# Patient Record
Sex: Male | Born: 1935 | Race: White | Hispanic: No | Smoking: Never smoker
Health system: Southern US, Community
[De-identification: ages and names within clinical notes are randomized; demographics above are authoritative.]

## PROBLEM LIST (undated history)

## (undated) DIAGNOSIS — I10 Essential (primary) hypertension: Secondary | ICD-10-CM

## (undated) DIAGNOSIS — E039 Hypothyroidism, unspecified: Secondary | ICD-10-CM

## (undated) DIAGNOSIS — G7000 Myasthenia gravis without (acute) exacerbation: Secondary | ICD-10-CM

## (undated) HISTORY — PX: BACK SURGERY: SHX140

---

## 2009-04-12 ENCOUNTER — Ambulatory Visit: Payer: Self-pay | Admitting: Urology

## 2020-04-19 ENCOUNTER — Emergency Department: Payer: Medicare Other

## 2020-04-19 ENCOUNTER — Encounter: Payer: Self-pay | Admitting: Internal Medicine

## 2020-04-19 ENCOUNTER — Other Ambulatory Visit: Payer: Self-pay

## 2020-04-19 ENCOUNTER — Inpatient Hospital Stay
Admission: EM | Admit: 2020-04-19 | Discharge: 2020-04-22 | DRG: 683 | Disposition: A | Discharge status: Home / Self Care | Payer: Medicare Other | Source: Skilled Nursing Facility | Attending: Internal Medicine | Admitting: Internal Medicine

## 2020-04-19 DIAGNOSIS — Z66 Do not resuscitate: Secondary | ICD-10-CM | POA: Diagnosis present

## 2020-04-19 DIAGNOSIS — M5442 Lumbago with sciatica, left side: Secondary | ICD-10-CM

## 2020-04-19 DIAGNOSIS — M549 Dorsalgia, unspecified: Secondary | ICD-10-CM | POA: Diagnosis present

## 2020-04-19 DIAGNOSIS — W19XXXA Unspecified fall, initial encounter: Secondary | ICD-10-CM | POA: Diagnosis not present

## 2020-04-19 DIAGNOSIS — Z79899 Other long term (current) drug therapy: Secondary | ICD-10-CM

## 2020-04-19 DIAGNOSIS — Z20822 Contact with and (suspected) exposure to covid-19: Secondary | ICD-10-CM | POA: Diagnosis present

## 2020-04-19 DIAGNOSIS — Z79890 Hormone replacement therapy (postmenopausal): Secondary | ICD-10-CM

## 2020-04-19 DIAGNOSIS — E039 Hypothyroidism, unspecified: Secondary | ICD-10-CM | POA: Diagnosis not present

## 2020-04-19 DIAGNOSIS — Z981 Arthrodesis status: Secondary | ICD-10-CM

## 2020-04-19 DIAGNOSIS — I482 Chronic atrial fibrillation, unspecified: Secondary | ICD-10-CM | POA: Diagnosis not present

## 2020-04-19 DIAGNOSIS — I10 Essential (primary) hypertension: Secondary | ICD-10-CM | POA: Diagnosis present

## 2020-04-19 DIAGNOSIS — M25552 Pain in left hip: Secondary | ICD-10-CM | POA: Diagnosis present

## 2020-04-19 DIAGNOSIS — Z96641 Presence of right artificial hip joint: Secondary | ICD-10-CM | POA: Diagnosis present

## 2020-04-19 DIAGNOSIS — M1612 Unilateral primary osteoarthritis, left hip: Secondary | ICD-10-CM | POA: Diagnosis present

## 2020-04-19 DIAGNOSIS — M25562 Pain in left knee: Secondary | ICD-10-CM | POA: Diagnosis present

## 2020-04-19 DIAGNOSIS — Z7901 Long term (current) use of anticoagulants: Secondary | ICD-10-CM

## 2020-04-19 DIAGNOSIS — G7000 Myasthenia gravis without (acute) exacerbation: Secondary | ICD-10-CM | POA: Diagnosis present

## 2020-04-19 DIAGNOSIS — N179 Acute kidney failure, unspecified: Principal | ICD-10-CM | POA: Diagnosis present

## 2020-04-19 DIAGNOSIS — G894 Chronic pain syndrome: Secondary | ICD-10-CM | POA: Diagnosis present

## 2020-04-19 DIAGNOSIS — Z9181 History of falling: Secondary | ICD-10-CM

## 2020-04-19 DIAGNOSIS — Z7952 Long term (current) use of systemic steroids: Secondary | ICD-10-CM

## 2020-04-19 DIAGNOSIS — Z86711 Personal history of pulmonary embolism: Secondary | ICD-10-CM

## 2020-04-19 HISTORY — DX: Essential (primary) hypertension: I10

## 2020-04-19 HISTORY — DX: Hypothyroidism, unspecified: E03.9

## 2020-04-19 LAB — CBC
HCT: 43.3 % (ref 39.0–52.0)
Hemoglobin: 13.5 g/dL (ref 13.0–17.0)
MCH: 29.9 pg (ref 26.0–34.0)
MCHC: 31.2 g/dL (ref 30.0–36.0)
MCV: 96 fL (ref 80.0–100.0)
Platelets: 258 10*3/uL (ref 150–400)
RBC: 4.51 MIL/uL (ref 4.22–5.81)
RDW: 15.8 % — ABNORMAL HIGH (ref 11.5–15.5)
WBC: 11.5 10*3/uL — ABNORMAL HIGH (ref 4.0–10.5)
nRBC: 0 % (ref 0.0–0.2)

## 2020-04-19 LAB — COMPREHENSIVE METABOLIC PANEL
ALT: 14 U/L (ref 0–44)
AST: 19 U/L (ref 15–41)
Albumin: 3.7 g/dL (ref 3.5–5.0)
Alkaline Phosphatase: 52 U/L (ref 38–126)
Anion gap: 9 (ref 5–15)
BUN: 23 mg/dL (ref 8–23)
CO2: 30 mmol/L (ref 22–32)
Calcium: 8.9 mg/dL (ref 8.9–10.3)
Chloride: 100 mmol/L (ref 98–111)
Creatinine, Ser: 1.37 mg/dL — ABNORMAL HIGH (ref 0.61–1.24)
GFR, Estimated: 47 mL/min — ABNORMAL LOW (ref >60)
Glucose, Bld: 95 mg/dL (ref 70–99)
Potassium: 4 mmol/L (ref 3.5–5.1)
Sodium: 139 mmol/L (ref 135–145)
Total Bilirubin: 0.8 mg/dL (ref 0.3–1.2)
Total Protein: 6.2 g/dL — ABNORMAL LOW (ref 6.5–8.1)

## 2020-04-19 LAB — RESP PANEL BY RT-PCR (FLU A&B, COVID) ARPGX2
Influenza A by PCR: NEGATIVE
Influenza B by PCR: NEGATIVE
SARS Coronavirus 2 by RT PCR: NEGATIVE

## 2020-04-19 MED ORDER — ONDANSETRON HCL 4 MG PO TABS: 4.0000 mg | ORAL_TABLET | Freq: Four times a day (QID) | ORAL | Status: DC | PRN

## 2020-04-19 MED ORDER — FENTANYL CITRATE (PF) 100 MCG/2ML IJ SOLN
50.0000 ug | Freq: Once | INTRAMUSCULAR | Status: AC
Start: 2020-04-19 — End: 2020-04-19
  Administered 2020-04-19: 50 ug via INTRAVENOUS
  Filled 2020-04-19: qty 2

## 2020-04-19 MED ORDER — MYCOPHENOLATE MOFETIL 500 MG PO TABS: 500.0000 mg | ORAL_TABLET | Freq: Two times a day (BID) | ORAL | Status: DC

## 2020-04-19 MED ORDER — PREDNISONE 20 MG PO TABS
20.0000 mg | ORAL_TABLET | Freq: Every day | ORAL | Status: DC
  Administered 2020-04-19 – 2020-04-22 (×4): 20 mg via ORAL
  Filled 2020-04-19 (×4): qty 1

## 2020-04-19 MED ORDER — MYCOPHENOLATE MOFETIL 250 MG PO CAPS
500.0000 mg | ORAL_CAPSULE | Freq: Every day | ORAL | Status: DC
  Administered 2020-04-19 – 2020-04-21 (×3): 500 mg via ORAL
  Filled 2020-04-19 (×5): qty 2

## 2020-04-19 MED ORDER — CALCIUM CARBONATE-VITAMIN D 500-200 MG-UNIT PO TABS
1.0000 | ORAL_TABLET | Freq: Every day | ORAL | Status: DC
  Administered 2020-04-20 – 2020-04-22 (×3): 1 via ORAL
  Filled 2020-04-19 (×3): qty 1

## 2020-04-19 MED ORDER — APIXABAN 5 MG PO TABS
5.0000 mg | ORAL_TABLET | Freq: Two times a day (BID) | ORAL | Status: DC
  Administered 2020-04-19 – 2020-04-22 (×7): 5 mg via ORAL
  Filled 2020-04-19 (×7): qty 1

## 2020-04-19 MED ORDER — MYCOPHENOLATE MOFETIL 250 MG PO CAPS
1000.0000 mg | ORAL_CAPSULE | Freq: Every day | ORAL | Status: DC
  Administered 2020-04-19 – 2020-04-22 (×4): 1000 mg via ORAL
  Filled 2020-04-19 (×4): qty 4

## 2020-04-19 MED ORDER — ACETAMINOPHEN 500 MG PO TABS
500.0000 mg | ORAL_TABLET | Freq: Three times a day (TID) | ORAL | Status: DC | PRN
  Administered 2020-04-20 – 2020-04-21 (×2): 500 mg via ORAL
  Filled 2020-04-19 (×2): qty 1

## 2020-04-19 MED ORDER — PANTOPRAZOLE SODIUM 40 MG PO TBEC
40.0000 mg | DELAYED_RELEASE_TABLET | Freq: Every day | ORAL | Status: DC
  Administered 2020-04-19 – 2020-04-22 (×4): 40 mg via ORAL
  Filled 2020-04-19 (×4): qty 1

## 2020-04-19 MED ORDER — TIMOLOL MALEATE 0.25 % OP SOLN
1.0000 [drp] | Freq: Two times a day (BID) | OPHTHALMIC | Status: DC
  Administered 2020-04-19 – 2020-04-22 (×6): 1 [drp] via OPHTHALMIC
  Filled 2020-04-19: qty 5

## 2020-04-19 MED ORDER — ALBUTEROL SULFATE HFA 108 (90 BASE) MCG/ACT IN AERS
1.0000 | INHALATION_SPRAY | Freq: Four times a day (QID) | RESPIRATORY_TRACT | Status: DC | PRN
  Filled 2020-04-19: qty 6.7

## 2020-04-19 MED ORDER — FENTANYL CITRATE (PF) 100 MCG/2ML IJ SOLN
50.0000 ug | Freq: Once | INTRAMUSCULAR | Status: AC
  Administered 2020-04-19: 50 ug via INTRAVENOUS
  Filled 2020-04-19: qty 2

## 2020-04-19 MED ORDER — TRAMADOL HCL 50 MG PO TABS
50.0000 mg | ORAL_TABLET | Freq: Four times a day (QID) | ORAL | Status: DC | PRN
  Administered 2020-04-19 – 2020-04-22 (×7): 50 mg via ORAL
  Filled 2020-04-19 (×8): qty 1

## 2020-04-19 MED ORDER — LIDOCAINE 5 % EX PTCH
1.0000 | MEDICATED_PATCH | CUTANEOUS | Status: DC
  Administered 2020-04-19 – 2020-04-21 (×3): 1 via TRANSDERMAL
  Filled 2020-04-19 (×4): qty 1

## 2020-04-19 MED ORDER — FUROSEMIDE 20 MG PO TABS
20.0000 mg | ORAL_TABLET | Freq: Every day | ORAL | Status: DC
  Administered 2020-04-19 – 2020-04-20 (×2): 20 mg via ORAL
  Filled 2020-04-19 (×2): qty 1

## 2020-04-19 MED ORDER — PYRIDOSTIGMINE BROMIDE 60 MG PO TABS
60.0000 mg | ORAL_TABLET | Freq: Three times a day (TID) | ORAL | Status: DC
  Administered 2020-04-19 – 2020-04-22 (×9): 60 mg via ORAL
  Filled 2020-04-19 (×12): qty 1

## 2020-04-19 MED ORDER — BRIMONIDINE TARTRATE 0.2 % OP SOLN
1.0000 [drp] | Freq: Two times a day (BID) | OPHTHALMIC | Status: DC
  Administered 2020-04-19 – 2020-04-22 (×6): 1 [drp] via OPHTHALMIC
  Filled 2020-04-19: qty 5

## 2020-04-19 MED ORDER — ONDANSETRON HCL 4 MG/2ML IJ SOLN
4.0000 mg | Freq: Once | INTRAMUSCULAR | Status: AC
Start: 2020-04-19 — End: 2020-04-19
  Administered 2020-04-19: 4 mg via INTRAVENOUS
  Filled 2020-04-19: qty 2

## 2020-04-19 MED ORDER — LEVOTHYROXINE SODIUM 88 MCG PO TABS
88.0000 ug | ORAL_TABLET | Freq: Every day | ORAL | Status: DC
  Administered 2020-04-20 – 2020-04-22 (×3): 88 ug via ORAL
  Filled 2020-04-19 (×3): qty 1

## 2020-04-19 MED ORDER — ONDANSETRON HCL 4 MG/2ML IJ SOLN: 4.0000 mg | Freq: Four times a day (QID) | INTRAMUSCULAR | Status: DC | PRN

## 2020-04-19 NOTE — ED Notes (Signed)
Pt still c.o pain to lower back, Dr Lenard Lance notified and prn pain med administered.  Family at bedside

## 2020-04-19 NOTE — ED Provider Notes (Signed)
[facility] Emergency Department Provider Note  Time seen: 7:46 AM  I have reviewed the triage vital signs and the nursing notes.   HISTORY  Chief Complaint Back Pain   HPI Cadarius Nevares is a 87 y.o. male with a past medical history of back pain status post lumbar fusion many years ago, atrial fibrillation on Eliquis, presents to the emergency department with back pain after a fall 3 days ago.  According to the patient and daughter 3 days ago patient fell and hit his left hip/pelvis on the counter.  He has been having pain ever since but per his nursing home had considerably more pain last night and today so they called the daughter and sent him to the emergency department for evaluation.  Here the daughter states the patient has back pain at times but nothing like this.  Patient describes pain going down the back of the left leg at times.  Normal sensation in his foot.  No incontinence.   No past medical history on file.  There are no problems to display for this patient.    Prior to Admission medications   Not on File    Allergies  Allergen Reactions  . Sulfa Antibiotics     No family history on file.  Social History    Review of Systems Constitutional: Negative for fever. Cardiovascular: Negative for chest pain. Respiratory: Negative for shortness of breath. Gastrointestinal: Negative for abdominal pain, vomiting Musculoskeletal: Left lower back/hip pain.  Moderate to severe, worse with movement. Neurological: Negative for headache All other ROS negative  ____________________________________________   PHYSICAL EXAM:  VITAL SIGNS: ED Triage Vitals  Enc Vitals Group     BP 04/19/20 0656 (!) 155/104     Pulse Rate 04/19/20 0656 83     Resp 04/19/20 0656 16     Temp 04/19/20 0656 98.2 F (36.8 C)     Temp Source 04/19/20 0656 Oral     SpO2 04/19/20 0656 100 %     Weight 04/19/20 0657 180 lb (81.6 kg)     Height 04/19/20 0657 5\' 7"   (1.702 m)     Head Circumference --      Peak Flow --      Pain Score 04/19/20 0657 10     Pain Loc --      Pain Edu? --      Excl. in GC? --    Constitutional: Alert and oriented. Well appearing and in no distress. Eyes: Normal exam ENT      Head: Normocephalic and atraumatic.      Mouth/Throat: Mucous membranes are moist. Cardiovascular: Normal rate, regular rhythm.  Respiratory: Normal respiratory effort without tachypnea nor retractions. Breath sounds are clear Gastrointestinal: Soft and nontender. No distention.  Musculoskeletal: Left hip tenderness palpation, left SI joint tenderness to palpation.  Pain with range of motion of left lower extremity. Neurologic:  Normal speech and language. No gross focal neurologic deficits.  Normal sensation to left lower extremity. Skin:  Skin is warm, dry Psychiatric: Mood and affect are normal.   ____________________________________________    EKG  EKG viewed and interpreted by myself shows atrial fibrillation at 81 bpm with a narrow QRS, normal axis, normal intervals, no concerning ST changes.  ____________________________________________    RADIOLOGY  X-rays are negative. CT scans are negative for acute abnormality besides subcutaneous edema lateral to left greater trochanter.  ____________________________________________   INITIAL IMPRESSION / ASSESSMENT AND PLAN / ED COURSE  Pertinent labs & imaging  results that were available during my care of the patient were reviewed by me and considered in my medical decision making (see chart for details).   Patient presents to the emergency department after a fall 3 days ago lower back pain.  Patient does have tenderness over the left hip left SI joint area.  Could be skeletal versus muscular.  Also describes pain occasionally radiating down the left leg could be experiencing sciatica.  Patient has good sensation throughout the left leg, no incontinence, no concern for cauda equina at  this time.  We will check x-rays, treat pain and nausea while awaiting results.  Patient and daughter agreeable plan of care.  Imaging is negative for acute bony abnormality.  However patient continues to have significant pain worse with movement despite IV medication.  Given the patient's continued pain suspect likely muscular pain/spasm versus sciatica.  Patient unable to ambulate or get up safely due to pain.  We will admit to the hospitalist service for pain control.  Patient agreeable plan of care.  Jolon Degante was evaluated in Emergency Department on 04/19/2020 for the symptoms described in the history of present illness. He was evaluated in the context of the global COVID-19 pandemic, which necessitated consideration that the patient might be at risk for infection with the SARS-CoV-2 virus that causes COVID-19. Institutional protocols and algorithms that pertain to the evaluation of patients at risk for COVID-19 are in a state of rapid change based on information released by regulatory bodies including the CDC and federal and state organizations. These policies and algorithms were followed during the patient's care in the ED.  ____________________________________________   FINAL CLINICAL IMPRESSION(S) / ED DIAGNOSES  acute back pain   Minna Antis, MD 04/19/20 1127

## 2020-04-19 NOTE — ED Notes (Signed)
Called lab to add on CBC and CMP to previously sent blood work. Per lab, will add on at this time

## 2020-04-19 NOTE — ED Triage Notes (Signed)
Pt BIB EMS, sustained mechanical fall 2 days ago while reaching for walker from bed. Now complaining of worsening pain to lower back radiating to left leg. Pt denies hitting head or LOC, is on Elaquis.

## 2020-04-19 NOTE — H&P (Signed)
History and Physical    Gregory Small IWL:[id number] DOB: 01/11/1920 DOA: 04/19/2020  PCP: Almet[facility]   Patient coming from: Assisted living facility  I have personally briefly reviewed patient's old medical records in [facility]  Chief Complaint: Pain in the left hip  HPI: Gregory Small is a 87 y.o. male with medical history significant for prior lumbar fusion, A. fib on anticoagulation, hypothyroidism and myasthenia gravis who presents to the ER via EMS for evaluation of pain involving his left hip he has had for about 3 days.  He rates his pain an 8 x 10 in intensity at its worst with radiation down his left leg.  Patient sustained a mechanical fall while reaching for his walker from his bed landing on his left side.  He denies hitting his head or loss of consciousness. The staff at the assisted living facility had tried Tylenol for pain control as well as ice without any significant improvement and so patient was brought to the ER today.  He received 2 doses of fentanyl with transient improvement in his pain. He denies having any chest pain, no shortness of breath, no dizziness, no lightheadedness, no headache, no nausea, no vomiting, no diarrhea, no abdominal pain, no fever, no chills, no cough, no sore throat , no urinary frequency, no nocturia, no dysuria. Labs show sodium 139, potassium 4.0, chloride 100, bicarb 30, glucose 95, BUN 23, creatinine 1.37, calcium 8.9, alkaline phosphatase 52, albumin 3.7, AST 19, ALT 14, total protein 6.2, white count 11.5, hemoglobin 13.5, hematocrit 43.3, MCV 96, RDW 15.8, platelet count 258 Respiratory viral panel is pending Left hip x-ray shows mild degenerative joint disease of the left hip. No acute abnormality is noted. Lumbar spine x-ray shows  post surgical posterior fusion of L4-5 and L5-S1. Multilevel degenerative disc disease is noted. No acute abnormality seen in the lumbar spine. Aortic Atherosclerosis CT scan of  the pelvis without contrast shows no acute osseous abnormality of the pelvis. Mild left hip osteoarthritis. Focal subcutaneous edema lateral to the left greater trochanter without focal hematoma. Postsurgical changes status post right total hip arthroplasty and lumbosacral fusion. No evidence of hardware complication. Colonic diverticulosis without evidence of acute diverticulitis. Aortic atherosclerosis  Twelve-lead EKG reviewed by me shows atrial fibrillation.    ED Course: Patient is a 87 year old Caucasian male who presents to the ER for evaluation of left hip pain following a mechanical fall.  He received 2 doses of fentanyl in the ER with transient improvement in his pain and will be referred to observation for further evaluation.   Review of Systems: As per HPI otherwise all other systems reviewed and negative.    Past Medical History:  Diagnosis Date  . Hypertension   . Hypothyroidism       reports that he has never smoked. He does not have any smokeless tobacco history on file. He reports previous alcohol use. He reports that he does not use drugs.  Allergies  Allergen Reactions  . Simvastatin Other (See Comments)    Myalgias   . Sulfa Antibiotics     Family History  Family history unknown: Yes      Prior to Admission medications   Medication Sig Start Date End Date Taking? Authorizing Provider  acetaminophen (TYLENOL) 500 MG tablet Take 500-1,000 mg by mouth every 8 (eight) hours as needed for pain. 12/24/19  Yes [provider]  brimonidine (ALPHAGAN) 0.2 % ophthalmic solution Place 1 drop into both eyes 2 (two) times daily.  04/12/20  Yes [provider]  Calcium Carbonate-Vit D-Min (CALTRATE 600+D PLUS MINERALS) 600-800 MG-UNIT TABS Take 1 tablet by mouth daily.   Yes [provider]  ELIQUIS 5 MG TABS tablet Take 5 mg by mouth 2 (two) times daily. 04/14/20  Yes [provider]  furosemide (LASIX) 20 MG tablet Take 20 mg by mouth  daily. 03/30/20  Yes [provider]  levothyroxine (SYNTHROID) 88 MCG tablet Take 88 mcg by mouth daily before breakfast. 11/12/19  Yes [provider]  mycophenolate (CELLCEPT) 500 MG tablet Take 500-1,000 mg by mouth 2 (two) times daily. 04/14/20  Yes [provider]  pantoprazole (PROTONIX) 40 MG tablet Take 40 mg by mouth daily. 04/14/20  Yes [provider]  predniSONE (DELTASONE) 20 MG tablet Take 20 mg by mouth daily. 04/14/20  Yes [provider]  pyridostigmine (MESTINON) 60 MG tablet Take 60 mg by mouth 3 (three) times daily. 04/14/20  Yes [provider]  timolol (TIMOPTIC) 0.25 % ophthalmic solution Apply 1 drop to eye 2 (two) times daily. 10/22/19  Yes [provider]  albuterol (VENTOLIN HFA) 108 (90 Base) MCG/ACT inhaler Inhale 1-2 puffs into the lungs every 6 (six) hours as needed for wheezing or shortness of breath. 04/15/20   [provider]    Physical Exam: Vitals:   04/19/20 0930 04/19/20 1015 04/19/20 1100 04/19/20 1107  BP: (!) 151/80   (!) 150/80  Pulse:  79 86 85  Resp: 18 18 17 17   Temp:      TempSrc:      SpO2:  100% 100% 100%  Weight:      Height:         Vitals:   04/19/20 0930 04/19/20 1015 04/19/20 1100 04/19/20 1107  BP: (!) 151/80   (!) 150/80  Pulse:  79 86 85  Resp: 18 18 17 17   Temp:      TempSrc:      SpO2:  100% 100% 100%  Weight:      Height:          Constitutional: Alert and oriented x 3.  Appears uncomfortable HEENT:      Head: Normocephalic and atraumatic.         Eyes: PERLA, EOMI, Conjunctivae are normal. Sclera is non-icteric.       Mouth/Throat: Mucous membranes are moist.       Neck: Supple with no signs of meningismus. Cardiovascular: Regular rate and rhythm. No murmurs, gallops, or rubs. 2+ symmetrical distal pulses are present . No JVD. No LE edema Respiratory: Respiratory effort normal .Lungs sounds clear bilaterally. No wheezes, crackles, or rhonchi.   Gastrointestinal: Soft, non tender, and non distended with positive bowel sounds.  Genitourinary: No CVA tenderness. Musculoskeletal: tender over the left hip, decrease ROM involving the left hip  Neurologic:  Face is symmetric. Moving all extremities. No gross focal neurologic deficits  Skin: Skin is warm, dry.  No rash or ulcers Psychiatric: Mood and affect are normal   Labs on Admission: I have personally reviewed following labs and imaging studies  CBC: Recent Labs  Lab 04/19/20 0701  WBC 11.5*  HGB 13.5  HCT 43.3  MCV 96.0  PLT 258   Basic Metabolic Panel: Recent Labs  Lab 04/19/20 0701  NA 139  K 4.0  CL 100  CO2 30  GLUCOSE 95  BUN 23  CREATININE 1.37*  CALCIUM 8.9   GFR: Estimated Creatinine Clearance: 30.8 mL/min (A) (by C-G formula based on  SCr of 1.37 mg/dL (H)). Liver Function Tests: Recent Labs  Lab 04/19/20 0701  AST 19  ALT 14  ALKPHOS 52  BILITOT 0.8  PROT 6.2*  ALBUMIN 3.7   No results for input(s): LIPASE, AMYLASE in the last 168 hours. No results for input(s): AMMONIA in the last 168 hours. Coagulation Profile: No results for input(s): INR, PROTIME in the last 168 hours. Cardiac Enzymes: No results for input(s): CKTOTAL, CKMB, CKMBINDEX, TROPONINI in the last 168 hours. BNP (last 3 results) No results for input(s): PROBNP in the last 8760 hours. HbA1C: No results for input(s): HGBA1C in the last 72 hours. CBG: No results for input(s): GLUCAP in the last 168 hours. Lipid Profile: No results for input(s): CHOL, HDL, LDLCALC, TRIG, CHOLHDL, LDLDIRECT in the last 72 hours. Thyroid Function Tests: No results for input(s): TSH, T4TOTAL, FREET4, T3FREE, THYROIDAB in the last 72 hours. Anemia Panel: No results for input(s): VITAMINB12, FOLATE, FERRITIN, TIBC, IRON, RETICCTPCT in the last 72 hours. Urine analysis: No results found for: COLORURINE, APPEARANCEUR, LABSPEC, PHURINE, GLUCOSEU, HGBUR, BILIRUBINUR, KETONESUR, PROTEINUR,  UROBILINOGEN, NITRITE, LEUKOCYTESUR  Radiological Exams on Admission: DG Lumbar Spine Complete  Result Date: 04/19/2020 CLINICAL DATA:  Low back pain after fall 2 days ago. EXAM: LUMBAR SPINE - COMPLETE 4+ VIEW COMPARISON:  None. FINDINGS: Status post surgical posterior fusion of L4-5 and L5-S1 with bilateral intrapedicular screw placement and interbody fusion of L4-5. Minimal grade 1 retrolisthesis of L2-3 and L3-4 is noted secondary to moderate degenerative disc disease at these levels. Moderate degenerative disc disease is also noted at L1-2. No fracture is noted. IMPRESSION: Status post surgical posterior fusion of L4-5 and L5-S1. Multilevel degenerative disc disease is noted. No acute abnormality seen in the lumbar spine. Aortic Atherosclerosis (ICD10-I70.0). Electronically Signed   By: Lupita Raider M.D.   On: 04/19/2020 09:05   CT PELVIS WO CONTRAST  Result Date: 04/19/2020 CLINICAL DATA:  Fall with pelvic pain and left hip pain EXAM: CT PELVIS WITHOUT CONTRAST TECHNIQUE: Multidetector CT imaging of the pelvis was performed following the standard protocol without intravenous contrast. COMPARISON:  X-ray 04/19/2020 FINDINGS: Urinary Tract: Urinary bladder is decompressed, limiting its evaluation. No distal ureteral dilation. Bowel: Scattered colonic diverticulosis. Visualized pelvic bowel loops otherwise within normal limits. No inflammatory changes evident. Vascular/Lymphatic: Extensive aortoiliac calcified atherosclerosis. No intrapelvic or inguinal lymphadenopathy. Reproductive:  No mass or other significant abnormality Other:  No free fluid within the pelvis.  No hernia visualized. Musculoskeletal: Postsurgical changes status post right total hip arthroplasty and L4-S1 posterior and interbody fusion and decompression. No evidence of hardware complication. Evidence of prior bone graft harvest site at the posterior left ilium. Bony pelvis is otherwise intact without evidence of fracture or  diastasis. Mild arthropathy of the SI joints and pubic symphysis. Mild left hip osteoarthritis. No left hip joint effusion is seen. Focal subcutaneous edema lateral to the left greater trochanter without focal hematoma. Mild enthesopathic changes at the bilateral gluteus medius tendon insertions and bilateral hamstring tendon origins. IMPRESSION: 1. No acute osseous abnormality of the pelvis. 2. Mild left hip osteoarthritis. 3. Focal subcutaneous edema lateral to the left greater trochanter without focal hematoma. 4. Postsurgical changes status post right total hip arthroplasty and lumbosacral fusion. No evidence of hardware complication. 5. Colonic diverticulosis without evidence of acute diverticulitis. 6. Aortic atherosclerosis (ICD10-I70.0). Electronically Signed   By: Duanne Guess D.O.   On: 04/19/2020 10:18   DG HIP UNILAT WITH PELVIS 2-3 VIEWS LEFT  Result Date: 04/19/2020  CLINICAL DATA:  Left hip pain after fall. EXAM: DG HIP (WITH OR WITHOUT PELVIS) 2-3V LEFT COMPARISON:  None. FINDINGS: There is no evidence of hip fracture or dislocation. Mild osteophyte formation of the left hip is noted. IMPRESSION: Mild degenerative joint disease of the left hip. No acute abnormality is noted. Electronically Signed   By: Lupita RaiderJames  Green Jr M.D.   On: 04/19/2020 09:04     Assessment/Plan Principal Problem:   Left hip pain Active Problems:   Myasthenia gravis (HCC)   Atrial fibrillation, chronic (HCC)   Hypothyroidism     Left hip pain Most likely contusion from a mechanical fall No evidence of fracture Pain control with Lidoderm patch and Tylenol as needed We will request PT evaluation    Chronic atrial fibrillation Rate controlled Continue Eliquis as primary prophylaxis for an acute stroke    Hypothyroidism Continue Synthroid   Myasthenia gravis Continue CellCept, prednisone and pyridostigmine  DVT prophylaxis: Apixaban Code Status: DO NOT RESUSCITATE Family Communication:  Greater than 50% of time was spent discussing patient's condition and plan of care with his daughter Hyacinth Meekerllison [place]ch[place] at the bedside.  All questions and concerns have been addressed.  She verbalizes understanding and agrees with the plan.  CODE STATUS was discussed and he is a DNR Disposition Plan: Back to previous home environment Consults called: Physical therapy Status: Observation    Rafferty Postlewait MD [facility]     04/19/2020, 12:07 PM

## 2020-04-19 NOTE — ED Notes (Signed)
Pt provided warm blanket for comfort.

## 2020-04-19 NOTE — ED Notes (Signed)
Light green and lavender blood tubes sent to lab for specimen hold.

## 2020-04-20 ENCOUNTER — Observation Stay: Payer: Medicare Other

## 2020-04-20 DIAGNOSIS — G894 Chronic pain syndrome: Secondary | ICD-10-CM | POA: Diagnosis present

## 2020-04-20 DIAGNOSIS — M5442 Lumbago with sciatica, left side: Secondary | ICD-10-CM

## 2020-04-20 DIAGNOSIS — M25552 Pain in left hip: Secondary | ICD-10-CM | POA: Diagnosis not present

## 2020-04-20 DIAGNOSIS — G7000 Myasthenia gravis without (acute) exacerbation: Secondary | ICD-10-CM

## 2020-04-20 DIAGNOSIS — M25562 Pain in left knee: Secondary | ICD-10-CM | POA: Diagnosis present

## 2020-04-20 DIAGNOSIS — E038 Other specified hypothyroidism: Secondary | ICD-10-CM

## 2020-04-20 DIAGNOSIS — I482 Chronic atrial fibrillation, unspecified: Secondary | ICD-10-CM | POA: Diagnosis not present

## 2020-04-20 LAB — CBC
HCT: 40.1 % (ref 39.0–52.0)
Hemoglobin: 12.7 g/dL — ABNORMAL LOW (ref 13.0–17.0)
MCH: 30.2 pg (ref 26.0–34.0)
MCHC: 31.7 g/dL (ref 30.0–36.0)
MCV: 95.5 fL (ref 80.0–100.0)
Platelets: 216 10*3/uL (ref 150–400)
RBC: 4.2 MIL/uL — ABNORMAL LOW (ref 4.22–5.81)
RDW: 15.8 % — ABNORMAL HIGH (ref 11.5–15.5)
WBC: 10.8 10*3/uL — ABNORMAL HIGH (ref 4.0–10.5)
nRBC: 0 % (ref 0.0–0.2)

## 2020-04-20 LAB — BASIC METABOLIC PANEL
Anion gap: 7 (ref 5–15)
BUN: 24 mg/dL — ABNORMAL HIGH (ref 8–23)
CO2: 31 mmol/L (ref 22–32)
Calcium: 8.8 mg/dL — ABNORMAL LOW (ref 8.9–10.3)
Chloride: 99 mmol/L (ref 98–111)
Creatinine, Ser: 1.36 mg/dL — ABNORMAL HIGH (ref 0.61–1.24)
GFR, Estimated: 47 mL/min — ABNORMAL LOW (ref >60)
Glucose, Bld: 111 mg/dL — ABNORMAL HIGH (ref 70–99)
Potassium: 4.4 mmol/L (ref 3.5–5.1)
Sodium: 137 mmol/L (ref 135–145)

## 2020-04-20 MED ORDER — SENNA 8.6 MG PO TABS
1.0000 | ORAL_TABLET | Freq: Every day | ORAL | Status: DC
  Administered 2020-04-20 – 2020-04-21 (×2): 8.6 mg via ORAL
  Filled 2020-04-20 (×2): qty 1

## 2020-04-20 MED ORDER — OXYCODONE HCL ER 10 MG PO T12A
10.0000 mg | EXTENDED_RELEASE_TABLET | Freq: Two times a day (BID) | ORAL | Status: DC
  Administered 2020-04-20 – 2020-04-22 (×5): 10 mg via ORAL
  Filled 2020-04-20 (×5): qty 1

## 2020-04-20 MED ORDER — OXYCODONE HCL 5 MG PO TABS
5.0000 mg | ORAL_TABLET | ORAL | Status: DC | PRN
  Administered 2020-04-21: 5 mg via ORAL
  Filled 2020-04-20: qty 1

## 2020-04-20 MED ORDER — GABAPENTIN 100 MG PO CAPS
100.0000 mg | ORAL_CAPSULE | Freq: Every day | ORAL | Status: DC
Start: 2020-04-20 — End: 2020-04-22
  Administered 2020-04-20 – 2020-04-21 (×2): 100 mg via ORAL
  Filled 2020-04-20 (×2): qty 1

## 2020-04-20 NOTE — TOC Initial Note (Signed)
Transition of Care [facility]) - Initial/Assessment Note    Patient Details  Name: Gregory Small MRN: [id number] Date of Birth: [birth year]  [facility] CM/SW Contact:    Candie Chroman, LCSW Phone Number: 04/20/2020, 3:18 PM  Clinical Narrative:    CSW met with patient. Daughter at bedside. CSW introduced role and explained that PT recommendations would be discussed. Patient listened to conversation but did not participate. Daughter confirmed patient lives at [facility]. She is agreeable to home health PT. She asked CSW to check and see what the ALF's preferred agency is. CSW spoke with patient's med tech. First preference is [facility] but CSW called and they are not in network with his insurance. Will talk with daughter about other options. Patient uses a rollator at the facility. Daughter had concerns about patient not being switched to inpatient status. Explained that he has to meet certain criteria to be switched to inpatient. Daughter requesting more detailed explanation. UR asked CSW or MD to provide explanation. Added MD to secure chat. No further concerns. CSW encouraged patient to contact CSW as needed. CSW will continue to follow patient for support and facilitate return to ALF once medically stable.              Expected Discharge Plan: [place] Barriers to Discharge: Continued Medical Work up   Patient Goals and CMS Choice        Expected Discharge Plan and Services Expected Discharge Plan: [place] Choice: [place] arrangements for the past 2 months: [place]                                      Prior Living Arrangements/Services Living arrangements for the past 2 months: [place] Lives with:: Facility Resident Patient language and need for interpreter reviewed:: Yes Do you feel safe going back to the place where you live?: Yes      Need for  Family Participation in Patient Care: Yes (Comment) Care giver support system in place?: Yes (comment) Current home services: DME Criminal Activity/Legal Involvement Pertinent to Current Situation/Hospitalization: No - Comment as needed  Activities of Daily Living Home Assistive Devices/Equipment: None ADL Screening (condition at time of admission) Patient's cognitive ability adequate to safely complete daily activities?: Yes Is the patient deaf or have difficulty hearing?: Yes Does the patient have difficulty seeing, even when wearing glasses/contacts?: No Does the patient have difficulty concentrating, remembering, or making decisions?: No Patient able to express need for assistance with ADLs?: Yes Does the patient have difficulty dressing or bathing?: Yes Independently performs ADLs?: Yes (appropriate for developmental age) Does the patient have difficulty walking or climbing stairs?: No Weakness of Legs: None Weakness of Arms/Hands: None  Permission Sought/Granted Permission sought to share information with : Facility Contact Representative,Family Supports    Share Information with NAME: Gregory Small  Permission granted to share info w AGENCY: [place] granted to share info w Relationship: Daughter  Permission granted to share info w Contact Information: [phone]  Emotional Assessment Appearance:: Appears stated age Attitude/Demeanor/Rapport: Unable to Assess Affect (typically observed): Unable to Assess Orientation: : Oriented to Self,Oriented to Place,Oriented to  Time,Oriented to Situation Alcohol / Substance Use: Not Applicable Psych Involvement: No (comment)  Admission diagnosis:  Fall [W19.XXXA] Left hip  pain [M25.552] Acute left-sided low back pain with left-sided sciatica [M54.42] Patient Active Problem List   Diagnosis Date Noted  . Left hip pain 04/19/2020  . Myasthenia gravis ([place]) 04/19/2020  .  Atrial fibrillation, chronic ([place]) 04/19/2020  . Hypothyroidism 04/19/2020  . Fall 04/19/2020   PCP:  [facility] Pharmacy:  No Pharmacies Listed    Social Determinants of Health (SDOH) Interventions    Readmission Risk Interventions No flowsheet data found.

## 2020-04-20 NOTE — Plan of Care (Signed)
  Problem: Education: Goal: Knowledge of General Education information will improve Description: Including pain rating scale, medication(s)/side effects and non-pharmacologic comfort measures Outcome: Progressing   Problem: Health Behavior/Discharge Planning: Goal: Ability to manage health-related needs will improve Outcome: Progressing   Problem: Activity: Goal: Risk for activity intolerance will decrease Outcome: Progressing   Problem: Coping: Goal: Level of anxiety will decrease Outcome: Progressing   Problem: Elimination: Goal: Will not experience complications related to bowel motility Outcome: Progressing Goal: Will not experience complications related to urinary retention Outcome: Progressing   Problem: Safety: Goal: Ability to remain free from injury will improve Outcome: Progressing   Problem: Skin Integrity: Goal: Risk for impaired skin integrity will decrease Outcome: Progressing   

## 2020-04-20 NOTE — Evaluation (Signed)
Physical Therapy Evaluation Patient Details Name: Gregory Small MRN: [id number] DOB: September 29, 1919 Today's Date: 04/20/2020   History of Present Illness  Per MD note: Gregory Small is a 87 y.o. male with medical history significant for prior lumbar fusion, A. fib on anticoagulation, hypothyroidism and myasthenia gravis who presents to the ER via EMS for evaluation of pain involving his left hip he has had for about 3 days.  He rates his pain an 8 x 10 in intensity at its worst with radiation down his left leg.  Patient sustained a mechanical fall while reaching for his walker from his bed landing on his left side.  He denies hitting his head or loss of consciousness.  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 No acute abnormality is noted.     Clinical Impression  Pt recevied sitting EOB and agreeable to therapy.  Pt notes that he is experiencing 8/10 pain on numerical pain scale in the L LE around the hip where he fell.  Pt was give pain medication approximately 15 minutes prior to session.  Daughter was present in room during therapy.  Pt was able to perform bed mobility without much difficulty.  Pt did note some dizziness upon standing and daughter endorses pt to have had previous hx of vertigo that is brought about with caffeine, medication, and changes in position.  Pt attempted ambulation once sx's were resolved, however he noted too much pain with performing.  Pt transferred back to EOB where he performed seated exercises.  Pt as difficulty in the L knee due to needing a knee replacement but daughter states he is too old to have procedure.  Pt demonstrates good strength with MMT.  Pt performed STS x3, and on third bout, patient attempted to ambulate.  Pt requires supervision for safety, and VC for maintaining the FWW close.  Pt able to ambulate around the nursing staion and back to room where he was left sitting EOB with daughter in room.  All needs were met, call  bell within reach, and bed alarm turned on.  Nursing notified of pt mobility.  Pt will benefit from skilled PT intervention to increase independence and safety with basic mobility in preparation for discharge to the venue listed below.  Pt and daughter report that he will be able to have physical therapy at his assisted living to assist with returning to PLOF.     Follow Up Recommendations Home health PT    Equipment Recommendations  None recommended by PT    Recommendations for Other Services       Precautions / Restrictions Precautions Precautions: None Restrictions Weight Bearing Restrictions: No      Mobility  Bed Mobility               General bed mobility comments: pt was sitting EOB when arriving to room.    Transfers Overall transfer level: Needs assistance Equipment used: Rolling walker (2 wheeled) Transfers: Sit to/from Stand Sit to Stand: Min guard         General transfer comment: pt need extra time to perform, however does well with CGA for safety.  Ambulation/Gait Ambulation/Gait assistance: Supervision Gait Distance (Feet): 200 Feet Assistive device: Rolling walker (2 wheeled) Gait Pattern/deviations: WFL(Within Functional Limits) Gait velocity: WNL   General Gait Details: Pt ambulates well with FWW, however at some can be too fast and allow the FWW to get too far forward.  Pt able to correct with VC.  Stairs  Wheelchair Mobility    Modified Rankin (Stroke Patients Only)       Balance Overall balance assessment: Mild deficits observed, not formally tested                                           Pertinent Vitals/Pain Pain Assessment: 0-10 Pain Score: 8  Pain Location: L Hip Pain Intervention(s): Limited activity within patient's tolerance;Monitored during session;Premedicated before session;Repositioned    Home Living Family/patient expects to be discharged to:: Assisted living                Home Equipment: Walker - 4 wheels      Prior Function Level of Independence: Independent with assistive device(s)               Hand Dominance        Extremity/Trunk Assessment   Upper Extremity Assessment Upper Extremity Assessment: Generalized weakness    Lower Extremity Assessment Lower Extremity Assessment: Generalized weakness       Communication      Cognition Arousal/Alertness: Awake/alert;Suspect due to medications Behavior During Therapy: [facility] for tasks assessed/performed Overall Cognitive Status: Within Functional Limits for tasks assessed                                 General Comments: Daughter reports she believes he is starting to get a little "loopy" from the pain medication.      General Comments      Exercises General Exercises - Lower Extremity Long Arc Quad: AROM;Strengthening;Both;20 reps;Seated Hip Flexion/Marching: AROM;Strengthening;Both;20 reps;Seated Other Exercises Other Exercises: Pt and daughter educated on roles of Physical Therapy and the physiological benefits of exercising during hospital stay.   Assessment/Plan    PT Assessment Patient needs continued PT services  PT Problem List Decreased strength;Decreased range of motion;Decreased activity tolerance;Decreased balance;Decreased mobility;Decreased safety awareness;Pain       PT Treatment Interventions DME instruction;Gait training;Stair training;Functional mobility training;Therapeutic activities;Therapeutic exercise;Balance training;Neuromuscular re-education;Patient/family education    PT Goals (Current goals can be found in the Care Plan section)  Acute Rehab PT Goals Patient Stated Goal: Go back to assisted living with HHPT. PT Goal Formulation: With patient/family Time For Goal Achievement: 05/04/20 Potential to Achieve Goals: Good    Frequency Min 2X/week   Barriers to discharge        Co-evaluation               AM-PAC PT "6 Clicks"  Mobility  Outcome Measure Help needed turning from your back to your side while in a flat bed without using bedrails?: A Little Help needed moving from lying on your back to sitting on the side of a flat bed without using bedrails?: A Little Help needed moving to and from a bed to a chair (including a wheelchair)?: A Little Help needed standing up from a chair using your arms (e.g., wheelchair or bedside chair)?: A Little Help needed to walk in hospital room?: A Little Help needed climbing 3-5 steps with a railing? : A Little 6 Click Score: 18    End of Session Equipment Utilized During Treatment: Gait belt Activity Tolerance: Patient limited by pain Patient left: in bed;with call bell/phone within reach;with bed alarm set;with family/visitor present Nurse Communication: Mobility status PT Visit Diagnosis: Unsteadiness on feet (R26.81);Other abnormalities of gait and mobility (R26.89);Muscle  weakness (generalized) (M62.81);History of falling (Z91.81);Difficulty in walking, not elsewhere classified (R26.2);Dizziness and giddiness (R42);Pain Pain - Right/Left: Left Pain - part of body: Hip    Time: 1023-1110 PT Time Calculation (min) (ACUTE ONLY): 47 min   Charges:   PT Evaluation $PT Eval Low Complexity: 1 Low PT Treatments $Gait Training: 23-37 mins $Therapeutic Exercise: 8-22 mins        Gwenlyn Saran, PT, DPT 04/20/20, 1:02 PM

## 2020-04-20 NOTE — Progress Notes (Signed)
PROGRESS NOTE    Gregory Small  RKY:[id number] DOB: 01-17-1920 DOA: 04/19/2020 PCP: Almetta Lovely, [facility]     Brief Narrative:  Gregory Small is a 87 y.o.  (from assisted living facility) WM PMHx prior lumbar fusion, A. fib on anticoagulation, hypothyroidism and myasthenia gravis   Presents to the ER via EMS for evaluation of pain involving his left hip he has had for about 3 days.  He rates his pain an 8 x 10 in intensity at its worst with radiation down his left leg.  Patient sustained a mechanical fall while reaching for his walker from his bed landing on his left side.  He denies hitting his head or loss of consciousness. The staff at the assisted living facility had tried Tylenol for pain control as well as ice without any significant improvement and so patient was brought to the ER today.  He received 2 doses of fentanyl with transient improvement in his pain. He denies having any chest pain, no shortness of breath, no dizziness, no lightheadedness, no headache, no nausea, no vomiting, no diarrhea, no abdominal pain, no fever, no chills, no cough, no sore throat , no urinary frequency, no nocturia, no dysuria. Labs show sodium 139, potassium 4.0, chloride 100, bicarb 30, glucose 95, BUN 23, creatinine 1.37, calcium 8.9, alkaline phosphatase 52, albumin 3.7, AST 19, ALT 14, total protein 6.2, white count 11.5, hemoglobin 13.5, hematocrit 43.3, MCV 96, RDW 15.8, platelet count 258 Respiratory viral panel is pending Left hip x-ray shows mild degenerative joint disease of the left hip. No acute abnormality is noted. Lumbar spine x-ray shows  post surgical posterior fusion of L4-5 and L5-S1. Multilevel degenerative disc disease is noted. No acute abnormality seen in the lumbar spine. Aortic Atherosclerosis CT scan of the pelvis without contrast shows no acute osseous abnormality of the pelvis. Mild left hip osteoarthritis. Focal subcutaneous edema lateral to the left greater  trochanter without focal hematoma. Postsurgical changes status post right total hip arthroplasty and lumbosacral fusion. No evidence of hardware complication. Colonic diverticulosis without evidence of acute diverticulitis. Aortic atherosclerosis  Twelve-lead EKG reviewed by me shows atrial fibrillation.    ED Course: Patient is a 87 year old Caucasian male who presents to the ER for evaluation of left hip pain following a mechanical fall.  He received 2 doses of fentanyl in the ER with transient improvement in his pain and will be referred to observation for further evaluation.   Subjective: A/O x4.  Patient states he was standing up and reached around for his rolling walker and missed the walker fell struck the floor on his left side.  Negative LOC.  Daughter believes that he struck the counter first and then struck the floor but did not witness fall.  Positive significant pain left knee (new onset)+ significant pain LEFT buttocks area.   Assessment & Plan: Covid vaccination; vaccinated 2/3   Principal Problem:   Left hip pain Active Problems:   Myasthenia gravis (HCC)   Atrial fibrillation, chronic (HCC)   Hypothyroidism   Fall   Left knee pain   Chronic pain syndrome   Left hip pain -X-ray and CT scans negative for acute fractures of hip or pelvis see results below. -Positive contusion. -Besides acute fall patient with significant structural damage to indicate need for pain control (patient's daughter states patient does not like to take pain medication).   -See chronic pain.    LEFT knee pain -New onset may have injured knee when he fell.  Definitely swollen  and tender -Tender to palpation medial joint line, positive valgus stress test, positive anterior draw -2/22 obtain LEFT knee x-ray.  May need to obtain MRI  Chronic pain syndrome -Patient admitted without any pain regimen except for Tylenol and tramadol which did not control patient's pain. -Continue Lidoderm  patch and Tylenol -2/22 OxyContin 10 mg BID -2/22 OxyIR 5 mg breakthrough pain -2/22 gabapentin 100 mg qhs  Chronic atrial fibrillation -Currently NSR -Apixaban 5 mg BID -Given patient's fall, age, and most likely increasing propensity for falling would discussed with daughter and patient discontinuing apixaban prior to discharge.  Believe this would be unnecessary risk.  Myasthenia gravis -Per daughter second Covid vaccination precipitated myasthenia gravis.   -Per patient's Neurologist continue the following;  -Mycophenolate 1000 mg daily/500 mg qhs  -Prednisone 20 mg daily  -Pyridostigmine 60 mg TID    Hypothyroidism -Synthroid 88 mcg daily   Goals of care -2/22 PT/OT consult; patient with significant fall injuring his left hip and knee severe pain.  Patient had been ambulatory with walker, now unable to ambulate.  Evaluate for CIR vs SNF     DVT prophylaxis: Apixaban Code Status: DNR Family Communication: 2/22 Revonda Standard (daughter) at bedside for discussion of plan of care answered all questions Status is: Inpatient    Dispo: The patient is from: Assisted living              Anticipated d/c is to: CIR vs SNF              Anticipated d/c date is: 2/24              Patient currently unstable      Consultants:    Procedures/Significant Events:  2/21 DG hip unilateral;Mild degenerative joint disease of the left hip. No acute abnormality is noted 2/21 DG L spine;Status post surgical posterior fusion of L4-5 and L5-S1. Multilevel degenerative disc disease is noted 2/21 CT pelvis W0 contrast;No acute osseous abnormality of the pelvis. 2. Mild left hip osteoarthritis. 3. Focal subcutaneous edema lateral to the left greater trochanter without focal hematoma. 4. Postsurgical changes status post right total hip arthroplasty and lumbosacral fusion. No evidence of hardware complication. 5. Colonic diverticulosis without evidence of acute diverticulitis.  I have  personally reviewed and interpreted all radiology studies and my findings are as above.  VENTILATOR SETTINGS:    Cultures 2/21 SARS coronavirus negative 2/21 influenza A/B negative    Antimicrobials:    Devices    LINES / TUBES:      Continuous Infusions:   Objective: Vitals:   04/20/20 0354 04/20/20 0700 04/20/20 0757 04/20/20 1254  BP: 128/81 (!) 162/91 (!) 165/89 (!) 163/93  Pulse: 62 78 85 88  Resp: 20 16 15 16   Temp: 97.8 F (36.6 C) 98 F (36.7 C) 97.7 F (36.5 C) 98.8 F (37.1 C)  TempSrc: Oral Oral Oral   SpO2: 98% 100% 99% 99%  Weight:      Height:        Intake/Output Summary (Last 24 hours) at 04/20/2020 1940 Last data filed at 04/20/2020 1910 Gross per 24 hour  Intake 240 ml  Output -  Net 240 ml   Filed Weights   04/19/20 0657  Weight: 81.6 kg    Examination:  General: A/O x4 No acute respiratory distress Eyes: negative scleral hemorrhage, negative anisocoria, negative icterus ENT: Negative Runny nose, negative gingival bleeding, Neck:  Negative scars, masses, torticollis, lymphadenopathy, JVD Lungs: Clear to auscultation bilaterally without wheezes or crackles  Cardiovascular: Regular rate and rhythm without murmur gallop or rub normal S1 and S2 Abdomen: negative abdominal pain, nondistended, positive soft, bowel sounds, no rebound, no ascites, no appreciable mass Extremities: No significant cyanosis, clubbing, or edema bilateral lower extremities Skin: Negative rashes, lesions, ulcers Psychiatric:  Negative depression, negative anxiety, negative fatigue, negative mania  Central nervous system:  Cranial nerves II through XII intact, tongue/uvula midline, all extremities muscle strength 5/5, sensation intact throughout, Left KNEE: Tender to palpation medial joint line, positive valgus stress test, positive anterior draw  negative dysarthria, negative expressive aphasia, negative receptive aphasia.  .     Data Reviewed: Care during  the described time interval was provided by me .  I have reviewed this patient's available data, including medical history, events of note, physical examination, and all test results as part of my evaluation.  CBC: Recent Labs  Lab 04/19/20 0701 04/20/20 0416  WBC 11.5* 10.8*  HGB 13.5 12.7*  HCT 43.3 40.1  MCV 96.0 95.5  PLT 258 216   Basic Metabolic Panel: Recent Labs  Lab 04/19/20 0701 04/20/20 0416  NA 139 137  K 4.0 4.4  CL 100 99  CO2 30 31  GLUCOSE 95 111*  BUN 23 24*  CREATININE 1.37* 1.36*  CALCIUM 8.9 8.8*   GFR: Estimated Creatinine Clearance: 31 mL/min (A) (by C-G formula based on SCr of 1.36 mg/dL (H)). Liver Function Tests: Recent Labs  Lab 04/19/20 0701  AST 19  ALT 14  ALKPHOS 52  BILITOT 0.8  PROT 6.2*  ALBUMIN 3.7   No results for input(s): LIPASE, AMYLASE in the last 168 hours. No results for input(s): AMMONIA in the last 168 hours. Coagulation Profile: No results for input(s): INR, PROTIME in the last 168 hours. Cardiac Enzymes: No results for input(s): CKTOTAL, CKMB, CKMBINDEX, TROPONINI in the last 168 hours. BNP (last 3 results) No results for input(s): PROBNP in the last 8760 hours. HbA1C: No results for input(s): HGBA1C in the last 72 hours. CBG: No results for input(s): GLUCAP in the last 168 hours. Lipid Profile: No results for input(s): CHOL, HDL, LDLCALC, TRIG, CHOLHDL, LDLDIRECT in the last 72 hours. Thyroid Function Tests: No results for input(s): TSH, T4TOTAL, FREET4, T3FREE, THYROIDAB in the last 72 hours. Anemia Panel: No results for input(s): VITAMINB12, FOLATE, FERRITIN, TIBC, IRON, RETICCTPCT in the last 72 hours. Sepsis Labs: No results for input(s): PROCALCITON, LATICACIDVEN in the last 168 hours.  Recent Results (from the past 240 hour(s))  Resp Panel by RT-PCR (Flu A&B, Covid) Nasopharyngeal Swab     Status: None   Collection Time: 04/19/20 11:12 AM   Specimen: Nasopharyngeal Swab; Nasopharyngeal(NP) swabs in  vial transport medium  Result Value Ref Range Status   SARS Coronavirus 2 by RT PCR NEGATIVE NEGATIVE Final    Comment: (NOTE) SARS-CoV-2 target nucleic acids are NOT DETECTED.  The SARS-CoV-2 RNA is generally detectable in upper respiratory specimens during the acute phase of infection. The lowest concentration of SARS-CoV-2 viral copies this assay can detect is 138 copies/mL. A negative result does not preclude SARS-Cov-2 infection and should not be used as the sole basis for treatment or other patient management decisions. A negative result may occur with  improper specimen collection/handling, submission of specimen other than nasopharyngeal swab, presence of viral mutation(s) within the areas targeted by this assay, and inadequate number of viral copies(<138 copies/mL). A negative result must be combined with clinical observations, patient history, and epidemiological information. The expected result is Negative.  Fact  Sheet for Patients:  BloggerCourse.comhttps://www.fda.gov/media/152166/download  Fact Sheet for Healthcare Providers:  SeriousBroker.ithttps://www.fda.gov/media/152162/download  This test is no t yet approved or cleared by the [place] FDA and  has been authorized for detection and/or diagnosis of SARS-CoV-2 by FDA under an Emergency Use Authorization (EUA). This EUA will remain  in effect (meaning this test can be used) for the duration of the COVID-19 declaration under Section 564(b)(1) of the Act, 21 U.S.C.section 360bbb-3(b)(1), unless the authorization is terminated  or revoked sooner.       Influenza A by PCR NEGATIVE NEGATIVE Final   Influenza B by PCR NEGATIVE NEGATIVE Final    Comment: (NOTE) The Xpert Xpress SARS-CoV-2/FLU/RSV plus assay is intended as an aid in the diagnosis of influenza from Nasopharyngeal swab specimens and should not be used as a sole basis for treatment. Nasal washings and aspirates are unacceptable for Xpert Xpress SARS-CoV-2/FLU/RSV testing.  Fact  Sheet for Patients: BloggerCourse.comhttps://www.fda.gov/media/152166/download  Fact Sheet for Healthcare Providers: SeriousBroker.ithttps://www.fda.gov/media/152162/download  This test is not yet approved or cleared by the [place] FDA and has been authorized for detection and/or diagnosis of SARS-CoV-2 by FDA under an Emergency Use Authorization (EUA). This EUA will remain in effect (meaning this test can be used) for the duration of the COVID-19 declaration under Section 564(b)(1) of the Act, 21 U.S.C. section 360bbb-3(b)(1), unless the authorization is terminated or revoked.  Performed at [facility], [address] [place], [place] [id number]          Radiology Studies: DG Lumbar Spine Complete  Result Date: 04/19/2020 CLINICAL DATA:  Low back pain after fall 2 days ago. EXAM: LUMBAR SPINE - COMPLETE 4+ VIEW COMPARISON:  None. FINDINGS: Status post surgical posterior fusion of L4-5 and L5-S1 with bilateral intrapedicular screw placement and interbody fusion of L4-5. Minimal grade 1 retrolisthesis of L2-3 and L3-4 is noted secondary to moderate degenerative disc disease at these levels. Moderate degenerative disc disease is also noted at L1-2. No fracture is noted. IMPRESSION: Status post surgical posterior fusion of L4-5 and L5-S1. Multilevel degenerative disc disease is noted. No acute abnormality seen in the lumbar spine. Aortic Atherosclerosis (ICD10-I70.0). Electronically Signed   By: Lupita RaiderJames  Green Jr M.D.   On: 04/19/2020 09:05   CT PELVIS WO CONTRAST  Result Date: 04/19/2020 CLINICAL DATA:  Fall with pelvic pain and left hip pain EXAM: CT PELVIS WITHOUT CONTRAST TECHNIQUE: Multidetector CT imaging of the pelvis was performed following the standard protocol without intravenous contrast. COMPARISON:  X-ray 04/19/2020 FINDINGS: Urinary Tract: Urinary bladder is decompressed, limiting its evaluation. No distal ureteral dilation. Bowel: Scattered colonic diverticulosis. Visualized pelvic bowel  loops otherwise within normal limits. No inflammatory changes evident. Vascular/Lymphatic: Extensive aortoiliac calcified atherosclerosis. No intrapelvic or inguinal lymphadenopathy. Reproductive:  No mass or other significant abnormality Other:  No free fluid within the pelvis.  No hernia visualized. Musculoskeletal: Postsurgical changes status post right total hip arthroplasty and L4-S1 posterior and interbody fusion and decompression. No evidence of hardware complication. Evidence of prior bone graft harvest site at the posterior left ilium. Bony pelvis is otherwise intact without evidence of fracture or diastasis. Mild arthropathy of the SI joints and pubic symphysis. Mild left hip osteoarthritis. No left hip joint effusion is seen. Focal subcutaneous edema lateral to the left greater trochanter without focal hematoma. Mild enthesopathic changes at the bilateral gluteus medius tendon insertions and bilateral hamstring tendon origins. IMPRESSION: 1. No acute osseous abnormality of the pelvis. 2. Mild left hip osteoarthritis. 3. Focal subcutaneous edema  lateral to the left greater trochanter without focal hematoma. 4. Postsurgical changes status post right total hip arthroplasty and lumbosacral fusion. No evidence of hardware complication. 5. Colonic diverticulosis without evidence of acute diverticulitis. 6. Aortic atherosclerosis (ICD10-I70.0). Electronically Signed   By: Duanne Guess D.O.   On: 04/19/2020 10:18   DG HIP UNILAT WITH PELVIS 2-3 VIEWS LEFT  Result Date: 04/19/2020 CLINICAL DATA:  Left hip pain after fall. EXAM: DG HIP (WITH OR WITHOUT PELVIS) 2-3V LEFT COMPARISON:  None. FINDINGS: There is no evidence of hip fracture or dislocation. Mild osteophyte formation of the left hip is noted. IMPRESSION: Mild degenerative joint disease of the left hip. No acute abnormality is noted. Electronically Signed   By: Lupita Raider M.D.   On: 04/19/2020 09:04        Scheduled Meds: . apixaban  5  mg Oral BID  . brimonidine  1 drop Both Eyes BID  . calcium-vitamin D  1 tablet Oral Daily  . furosemide  20 mg Oral Daily  . gabapentin  100 mg Oral QHS  . levothyroxine  88 mcg Oral QAC breakfast  . lidocaine  1 patch Transdermal Q24H  . mycophenolate  1,000 mg Oral Daily  . mycophenolate  500 mg Oral QHS  . oxyCODONE  10 mg Oral Q12H  . pantoprazole  40 mg Oral Daily  . predniSONE  20 mg Oral Daily  . pyridostigmine  60 mg Oral TID  . timolol  1 drop Both Eyes BID   Continuous Infusions:   LOS: 0 days    Time spent:40 min    WOODS, Roselind Messier, MD [facility]   If 7PM-7AM, please contact night-coverage 04/20/2020, 7:40 PM

## 2020-04-20 NOTE — Care Management Obs Status (Signed)
MEDICARE OBSERVATION STATUS NOTIFICATION   Patient Details  Name: Gregory Small MRN: [id number] Date of Birth: 12/21/1919   Medicare Observation Status Notification Given:  Yes    Margarito Liner, LCSW 04/20/2020, 2:42 PM

## 2020-04-21 DIAGNOSIS — Z66 Do not resuscitate: Secondary | ICD-10-CM | POA: Diagnosis present

## 2020-04-21 DIAGNOSIS — M25562 Pain in left knee: Secondary | ICD-10-CM | POA: Diagnosis present

## 2020-04-21 DIAGNOSIS — G894 Chronic pain syndrome: Secondary | ICD-10-CM | POA: Diagnosis present

## 2020-04-21 DIAGNOSIS — Z981 Arthrodesis status: Secondary | ICD-10-CM | POA: Diagnosis not present

## 2020-04-21 DIAGNOSIS — N179 Acute kidney failure, unspecified: Secondary | ICD-10-CM | POA: Diagnosis present

## 2020-04-21 DIAGNOSIS — E039 Hypothyroidism, unspecified: Secondary | ICD-10-CM | POA: Diagnosis present

## 2020-04-21 DIAGNOSIS — M549 Dorsalgia, unspecified: Secondary | ICD-10-CM | POA: Diagnosis present

## 2020-04-21 DIAGNOSIS — Z9181 History of falling: Secondary | ICD-10-CM | POA: Diagnosis not present

## 2020-04-21 DIAGNOSIS — M1612 Unilateral primary osteoarthritis, left hip: Secondary | ICD-10-CM | POA: Diagnosis present

## 2020-04-21 DIAGNOSIS — Z7901 Long term (current) use of anticoagulants: Secondary | ICD-10-CM | POA: Diagnosis not present

## 2020-04-21 DIAGNOSIS — Z86711 Personal history of pulmonary embolism: Secondary | ICD-10-CM | POA: Diagnosis not present

## 2020-04-21 DIAGNOSIS — Z96641 Presence of right artificial hip joint: Secondary | ICD-10-CM | POA: Diagnosis present

## 2020-04-21 DIAGNOSIS — Z20822 Contact with and (suspected) exposure to covid-19: Secondary | ICD-10-CM | POA: Diagnosis present

## 2020-04-21 DIAGNOSIS — Z79899 Other long term (current) drug therapy: Secondary | ICD-10-CM | POA: Diagnosis not present

## 2020-04-21 DIAGNOSIS — Z79890 Hormone replacement therapy (postmenopausal): Secondary | ICD-10-CM | POA: Diagnosis not present

## 2020-04-21 DIAGNOSIS — M25552 Pain in left hip: Secondary | ICD-10-CM | POA: Diagnosis present

## 2020-04-21 DIAGNOSIS — I10 Essential (primary) hypertension: Secondary | ICD-10-CM | POA: Diagnosis present

## 2020-04-21 DIAGNOSIS — I482 Chronic atrial fibrillation, unspecified: Secondary | ICD-10-CM | POA: Diagnosis present

## 2020-04-21 DIAGNOSIS — Z7952 Long term (current) use of systemic steroids: Secondary | ICD-10-CM | POA: Diagnosis not present

## 2020-04-21 DIAGNOSIS — G7000 Myasthenia gravis without (acute) exacerbation: Secondary | ICD-10-CM | POA: Diagnosis present

## 2020-04-21 LAB — CBC WITH DIFFERENTIAL/PLATELET
Abs Immature Granulocytes: 0.2 10*3/uL — ABNORMAL HIGH (ref 0.00–0.07)
Basophils Absolute: 0.1 10*3/uL (ref 0.0–0.1)
Basophils Relative: 0 %
Eosinophils Absolute: 0.1 10*3/uL (ref 0.0–0.5)
Eosinophils Relative: 1 %
HCT: 39.8 % (ref 39.0–52.0)
Hemoglobin: 13.2 g/dL (ref 13.0–17.0)
Immature Granulocytes: 2 %
Lymphocytes Relative: 23 %
Lymphs Abs: 2.7 10*3/uL (ref 0.7–4.0)
MCH: 30.8 pg (ref 26.0–34.0)
MCHC: 33.2 g/dL (ref 30.0–36.0)
MCV: 93 fL (ref 80.0–100.0)
Monocytes Absolute: 0.9 10*3/uL (ref 0.1–1.0)
Monocytes Relative: 8 %
Neutro Abs: 7.8 10*3/uL — ABNORMAL HIGH (ref 1.7–7.7)
Neutrophils Relative %: 66 %
Platelets: 227 10*3/uL (ref 150–400)
RBC: 4.28 MIL/uL (ref 4.22–5.81)
RDW: 15.3 % (ref 11.5–15.5)
WBC: 11.8 10*3/uL — ABNORMAL HIGH (ref 4.0–10.5)
nRBC: 0 % (ref 0.0–0.2)

## 2020-04-21 LAB — COMPREHENSIVE METABOLIC PANEL
ALT: 14 U/L (ref 0–44)
AST: 21 U/L (ref 15–41)
Albumin: 3.4 g/dL — ABNORMAL LOW (ref 3.5–5.0)
Alkaline Phosphatase: 55 U/L (ref 38–126)
Anion gap: 8 (ref 5–15)
BUN: 29 mg/dL — ABNORMAL HIGH (ref 8–23)
CO2: 31 mmol/L (ref 22–32)
Calcium: 8.9 mg/dL (ref 8.9–10.3)
Chloride: 97 mmol/L — ABNORMAL LOW (ref 98–111)
Creatinine, Ser: 1.49 mg/dL — ABNORMAL HIGH (ref 0.61–1.24)
GFR, Estimated: 42 mL/min — ABNORMAL LOW (ref >60)
Glucose, Bld: 89 mg/dL (ref 70–99)
Potassium: 4.5 mmol/L (ref 3.5–5.1)
Sodium: 136 mmol/L (ref 135–145)
Total Bilirubin: 1.1 mg/dL (ref 0.3–1.2)
Total Protein: 5.9 g/dL — ABNORMAL LOW (ref 6.5–8.1)

## 2020-04-21 LAB — PHOSPHORUS: Phosphorus: 4.3 mg/dL (ref 2.5–4.6)

## 2020-04-21 LAB — MAGNESIUM: Magnesium: 2.2 mg/dL (ref 1.7–2.4)

## 2020-04-21 MED ORDER — MAGNESIUM CITRATE PO SOLN
0.5000 | Freq: Every day | ORAL | Status: DC | PRN
  Filled 2020-04-21: qty 296

## 2020-04-21 MED ORDER — BISACODYL 10 MG RE SUPP: 10.0000 mg | Freq: Every day | RECTAL | Status: DC | PRN

## 2020-04-21 MED ORDER — SENNOSIDES-DOCUSATE SODIUM 8.6-50 MG PO TABS
1.0000 | ORAL_TABLET | Freq: Two times a day (BID) | ORAL | Status: DC
  Administered 2020-04-21 – 2020-04-22 (×2): 1 via ORAL
  Filled 2020-04-21 (×2): qty 1

## 2020-04-21 MED ORDER — POLYETHYLENE GLYCOL 3350 17 G PO PACK
17.0000 g | PACK | Freq: Two times a day (BID) | ORAL | Status: DC
  Administered 2020-04-21 – 2020-04-22 (×2): 17 g via ORAL
  Filled 2020-04-21 (×2): qty 1

## 2020-04-21 MED ORDER — SODIUM CHLORIDE 0.9 % IV SOLN: INTRAVENOUS | Status: AC

## 2020-04-21 MED ORDER — SODIUM CHLORIDE 0.9 % IV BOLUS
250.0000 mL | Freq: Once | INTRAVENOUS | Status: AC
  Administered 2020-04-21: 250 mL via INTRAVENOUS

## 2020-04-21 MED ORDER — SORBITOL 70 % SOLN
30.0000 mL | Freq: Every day | Status: DC | PRN
  Filled 2020-04-21: qty 30

## 2020-04-21 NOTE — Progress Notes (Signed)
Unsure of pt's full understanding of 0-10 pain scale. Will document pt's stated pain rating considering a secondary pain scale as well.

## 2020-04-21 NOTE — Progress Notes (Signed)
Gregory Small  QQV:[id number] DOB: 10/27/1919 DOA: 04/19/2020 PCP: [facility]    Brief Narrative:  87 year old ALF resident with a history of prior lumbar fusion, chronic atrial fibrillation on anticoagulation, hypothyroidism, and myasthenia gravis who presented to the [facility] ER with complaints of severe pain involving his left hip x3 days with radiation down into his left leg after sustaining a mechanical fall while attempting to reach his walker from his bed.  In the ED x-rays revealed no evidence of left hip fracture.  Lumbar spine x-rays were likewise without acute abnormality appreciable.  CT scan of the pelvis revealed no acute osseous abnormality but did note focal subcutaneous edema lateral to the left greater trochanter without a hematoma.  Antimicrobials:  none  DVT prophylaxis: Eliquis  Subjective: Resting comfortably in bed status post pain medications.  Reports the pain medicines are doing a good job of controlling his pain.  Denies chest pain nausea vomiting abdominal pain.  Does report poor oral intake.  Assessment & Plan:  Mechanical fall with refractory left hip and acute on chronic knee pain X-rays without evidence of acute fracture -slowly improving with persisting pain from soft tissue injury  Acute renal injury Creatinine has been climbing since presentation -likely prerenal azotemia due to poor intake and acute injury -hydrate and recheck creatinine in a.m. -if creatinine continues to climb will check CK to rule out the possibility of rhabdomyolysis  Chronic atrial fibrillation At this point the likelihood of a bleeding complication related to apixaban appears to outweigh any potential benefit he would get from ongoing anticoagulation -will discuss further with patient and family prior to discharge  Myasthenia gravis Continue usual outpatient medications  Hypothyroidism Continue usual home Synthroid dose   Code Status: NO CODE BLUE Family  Communication: Spoke with daughter at bedside at length Status is: Inpatient  Remains inpatient appropriate because:Inpatient level of care appropriate due to severity of illness   Dispo: The patient is from: ALF              Anticipated d/c is to: ALF              Anticipated d/c date is: 1 day              Patient currently is not medically stable to d/c.   Difficult to place patient No   Consultants:  none  Objective: Blood pressure (!) 150/80, pulse 63, temperature 97.7 F (36.5 C), temperature source Oral, resp. rate 17, height 5\' 7"  (1.702 m), weight 81.6 kg, SpO2 95 %.  Intake/Output Summary (Last 24 hours) at 04/21/2020 1008 Last data filed at 04/20/2020 1910 Gross per 24 hour  Intake 240 ml  Output --  Net 240 ml   Filed Weights   04/19/20 0657  Weight: 81.6 kg    Examination: General: No acute respiratory distress Lungs: Clear to auscultation bilaterally without wheezes or crackles Cardiovascular: Regular rate and rhythm without murmur gallop or rub normal S1 and S2 Abdomen: Nontender, nondistended, soft, bowel sounds positive, no rebound, no ascites, no appreciable mass Extremities: No significant cyanosis, clubbing, or edema bilateral lower extremities  CBC: Recent Labs  Lab 04/19/20 0701 04/20/20 0416 04/21/20 0440  WBC 11.5* 10.8* 11.8*  NEUTROABS  --   --  7.8*  HGB 13.5 12.7* 13.2  HCT 43.3 40.1 39.8  MCV 96.0 95.5 93.0  PLT 258 216 227   Basic Metabolic Panel: Recent Labs  Lab 04/19/20 0701 04/20/20 0416 04/21/20 0440  NA 139 137  136  K 4.0 4.4 4.5  CL 100 99 97*  CO2 30 31 31   GLUCOSE 95 111* 89  BUN 23 24* 29*  CREATININE 1.37* 1.36* 1.49*  CALCIUM 8.9 8.8* 8.9  MG  --   --  2.2  PHOS  --   --  4.3   GFR: Estimated Creatinine Clearance: 28.3 mL/min (A) (by C-G formula based on SCr of 1.49 mg/dL (H)).  Liver Function Tests: Recent Labs  Lab 04/19/20 0701 04/21/20 0440  AST 19 21  ALT 14 14  ALKPHOS 52 55  BILITOT 0.8 1.1   PROT 6.2* 5.9*  ALBUMIN 3.7 3.4*    Recent Results (from the past 240 hour(s))  Resp Panel by RT-PCR (Flu A&B, Covid) Nasopharyngeal Swab     Status: None   Collection Time: 04/19/20 11:12 AM   Specimen: Nasopharyngeal Swab; Nasopharyngeal(NP) swabs in vial transport medium  Result Value Ref Range Status   SARS Coronavirus 2 by RT PCR NEGATIVE NEGATIVE Final    Comment: (NOTE) SARS-CoV-2 target nucleic acids are NOT DETECTED.  The SARS-CoV-2 RNA is generally detectable in upper respiratory specimens during the acute phase of infection. The lowest concentration of SARS-CoV-2 viral copies this assay can detect is 138 copies/mL. A negative result does not preclude SARS-Cov-2 infection and should not be used as the sole basis for treatment or other patient management decisions. A negative result may occur with  improper specimen collection/handling, submission of specimen other than nasopharyngeal swab, presence of viral mutation(s) within the areas targeted by this assay, and inadequate number of viral copies(<138 copies/mL). A negative result must be combined with clinical observations, patient history, and epidemiological information. The expected result is Negative.  Fact Sheet for Patients:  04/21/20  Fact Sheet for Healthcare Providers:  BloggerCourse.com  This test is no t yet approved or cleared by the [employer] FDA and  has been authorized for detection and/or diagnosis of SARS-CoV-2 by FDA under an Emergency Use Authorization (EUA). This EUA will remain  in effect (meaning this test can be used) for the duration of the COVID-19 declaration under Section 564(b)(1) of the Act, 21 U.S.C.section 360bbb-3(b)(1), unless the authorization is terminated  or revoked sooner.       Influenza A by PCR NEGATIVE NEGATIVE Final   Influenza B by PCR NEGATIVE NEGATIVE Final    Comment: (NOTE) The Xpert Xpress  SARS-CoV-2/FLU/RSV plus assay is intended as an aid in the diagnosis of influenza from Nasopharyngeal swab specimens and should not be used as a sole basis for treatment. Nasal washings and aspirates are unacceptable for Xpert Xpress SARS-CoV-2/FLU/RSV testing.  Fact Sheet for Patients: [place]  Fact Sheet for Healthcare Providers: BloggerCourse.com  This test is not yet approved or cleared by the [employer] FDA and has been authorized for detection and/or diagnosis of SARS-CoV-2 by FDA under an Emergency Use Authorization (EUA). This EUA will remain in effect (meaning this test can be used) for the duration of the COVID-19 declaration under Section 564(b)(1) of the Act, 21 U.S.C. section 360bbb-3(b)(1), unless the authorization is terminated or revoked.  Performed at [facility], [address] [place], [place]      Scheduled Meds: . apixaban  5 mg Oral BID  . brimonidine  1 drop Both Eyes BID  . calcium-vitamin D  1 tablet Oral Daily  . furosemide  20 mg Oral Daily  . gabapentin  100 mg Oral QHS  . levothyroxine  88 mcg Oral QAC breakfast  .  lidocaine  1 patch Transdermal Q24H  . mycophenolate  1,000 mg Oral Daily  . mycophenolate  500 mg Oral QHS  . oxyCODONE  10 mg Oral Q12H  . pantoprazole  40 mg Oral Daily  . predniSONE  20 mg Oral Daily  . pyridostigmine  60 mg Oral TID  . senna  1 tablet Oral Daily  . timolol  1 drop Both Eyes BID     LOS: 0 days   Lonia Blood, MD [facility] Office  [phone] Pager - Text Page per Amion  If 7PM-7AM, please contact night-coverage per Amion 04/21/2020, 10:08 AM

## 2020-04-21 NOTE — Evaluation (Signed)
Occupational Therapy Evaluation Patient Details Name: Gregory Small MRN: [id number] DOB: 1919-04-18 Today's Date: 04/21/2020    History of Present Illness Per MD note: Gregory Small is a 87 y.o. male with medical history significant for prior lumbar fusion, A. fib on anticoagulation, hypothyroidism and myasthenia gravis who presents to the ER via EMS for evaluation of pain involving his left hip he has had for about 3 days.  He rates his pain an 8 x 10 in intensity at its worst with radiation down his left leg.  Patient sustained a mechanical fall while reaching for his walker from his bed landing on his left side.  He denies hitting his head or loss of consciousness.  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 No acute abnormality is noted.   Clinical Impression   Pt seen for OT evaluation this date in setting of acute hospitalization given fall at ALF and presenting with L hip pain. XR was unremarkable, but pt still with some pain and edema impacting his ability to perform ADLs. Pt reports that he is MOD I with rollator for fxl mobility and has some facility assist with IADLs such as meals and meds, but reports that he performs all his basic self care I'ly versus MOD I (use of shower chair for example). This date, pt requires CGA with progress to SUPV for ADL transfers and mobility with 2WW. Pt demos G balance, but does endorse some dizziness and OT engages below-listed exercises to improve pt circulation and decrease dizziness prior to performing fxl mobility. Pt performs fxl mobility to restroom and OT assist with MIN A with LB bathing/drying and donning socks as pt still with some discomfort in L hip. Pt able to perform UB ADLs with SETUP to INDEP. OT educates re: use of AE for LB ADLs and pt with some vague familiarity from h/o hip replacement, but requires increased training. Will continue to follow acutely and recommend that pt f/u with the in-house OT services  of[facility] at [facility].     Follow Up Recommendations  Outpatient OT (f/u OPOT services within [facility])    Equipment Recommendations  Other (comment) (adaptive equipment: sock aide, reacher, LH shoe horn)    Recommendations for Other Services       Precautions / Restrictions Precautions Precautions: None Restrictions Weight Bearing Restrictions: No      Mobility Bed Mobility               General bed mobility comments: pt was sitting EOB when arriving to room.    Transfers Overall transfer level: Needs assistance Equipment used: Rolling walker (2 wheeled) Transfers: Sit to/from Stand Sit to Stand: Min guard;Supervision         General transfer comment: extra time, CGA with progress to SUPV.    Balance Overall balance assessment: Mild deficits observed, not formally tested                                         ADL either performed or assessed with clinical judgement   ADL Overall ADL's : Needs assistance/impaired                                       General ADL Comments: INDEP for seated UB ADLs including shaving, requires MIN  A For seated LB ADLs d/t L hip discomfort following fall. OT educates re: potential need for AE education to improve efficiency of L LE LB dressing. Pt with good understanding and some vague familiarity as he had hip replacement a little over 10 years ago. Fxl mobility with RW from bed to restroom with CGA initially and progress to SUPV as pt demos good balance and safety awareness. One cue for hand placement to CTS and pt demos good carryover on all subsequent stands.     Vision Baseline Vision/History: Glaucoma Patient Visual Report: No change from baseline Additional Comments: pt endorses some dizziness/black spots (more excessive than his tyical glaucoma) intermittently and is noted to have pin-point pupils when c/o'ing of these symptoms. Pt requires time seated performing exercise to  improve circulation prior to attempting to mobilize and dizziness resolves and pt reports vision improves and he's noted to have better pupil size.     Perception     Praxis      Pertinent Vitals/Pain Pain Assessment: Faces Faces Pain Scale: Hurts even more Pain Location: L Hip Pain Descriptors / Indicators: Aching;Tender Pain Intervention(s): Limited activity within patient's tolerance;Monitored during session;Patient requesting pain meds-RN notified     Hand Dominance     Extremity/Trunk Assessment Upper Extremity Assessment Upper Extremity Assessment: Overall WFL for tasks assessed;Generalized weakness (somewhat limited shoulder ROM, flexion to ~3/4 range. Pt is primarily [facility] for ROM and MMT is grossly 4-/5, generally good and functional given pt's age.)   Lower Extremity Assessment Lower Extremity Assessment: Defer to PT evaluation;Overall [facility] for tasks assessed;Generalized weakness;LLE deficits/detail (pt noted to have edema in feet and he is aware when he needs to eleavte them. ROM overall WFL with of course, some limited ROM in L hip given pain/discomfort following fall) LLE Deficits / Details: endorses struggling with hip flexion versus baseline 2/2 discomfort in L hip following fall. Could benefit from AE education.       Communication Communication Communication: No difficulties   Cognition Arousal/Alertness: Awake/alert Behavior During Therapy: WFL for tasks assessed/performed Overall Cognitive Status: Within Functional Limits for tasks assessed                                 General Comments: Pt somewhat hard of hearing, but he is appropriate with all questions/commands. Some increased processing time, but generally above and beyond processing speeds and conversation given age. Pt's daughter reports he worked up until being about 89 (around the time of hip replacement)   General Comments       Exercises Other Exercises Other Exercises: OT engages pt  in seated MIP for 2 sets x10 and seated alternating FWD punches for 2 sets x10 to improve circulation prior to attempting to stand. Pt reports improvement in dizziness. In addition, OT Educates re: rationale, with pt verbalizing good understanding. In addition, pt requires ed re: use of AE for LB ADLs and is open to f/u in his ALF setting.   Shoulder Instructions      Home Living Family/patient expects to be discharged to:: Assisted living (Daughter moved pt from [place] to [facility] in November (she states pt developmed MG after booster shot and while he still moves well, she wanted him closer))                             Home Equipment: Environmental consultant - 4  wheels;Shower seat          Prior Functioning/Environment Level of Independence: Independent with assistive device(s)  Gait / Transfers Assistance Needed: MOD I with rollator, but endorses Vertigo and h/o falling. At least 2 in last 6 months. Somewhat unclear whether dizziness contributed to fall. ADL's / Homemaking Assistance Needed: Facility provides meals/med mgt, pt able to perform ADLs I'ly/MOD I (shower chair)            OT Problem List: Decreased strength;Decreased range of motion;Decreased activity tolerance;Impaired balance (sitting and/or standing);Pain;Decreased knowledge of use of DME or AE      OT Treatment/Interventions: Self-care/ADL training;DME and/or AE instruction;Therapeutic activities;Balance training;Therapeutic exercise;Patient/family education    OT Goals(Current goals can be found in the care plan section) Acute Rehab OT Goals Patient Stated Goal: get better and get released from hospital ASAP OT Goal Formulation: With patient Time For Goal Achievement: 05/05/20 Potential to Achieve Goals: Good ADL Goals Pt Will Perform Lower Body Bathing: with supervision;with adaptive equipment;sit to/from stand (with LRAD and AE PRN) Pt Will Perform Lower Body Dressing: with supervision;sit  to/from stand (with AE/LRAD PRN) Pt Will Transfer to Toilet: with supervision;ambulating;grab bars (with AE/LRAD PRN) Pt Will Perform Toileting - Clothing Manipulation and hygiene: with supervision;sit to/from stand (with AE/LRAD PRN)  OT Frequency: Min 1X/week   Barriers to D/C:            Co-evaluation              AM-PAC OT "6 Clicks" Daily Activity     Outcome Measure Help from another person eating meals?: None Help from another person taking care of personal grooming?: None Help from another person toileting, which includes using toliet, bedpan, or urinal?: A Little Help from another person bathing (including washing, rinsing, drying)?: A Little Help from another person to put on and taking off regular upper body clothing?: None Help from another person to put on and taking off regular lower body clothing?: A Little 6 Click Score: 21   End of Session Equipment Utilized During Treatment: Rolling walker Nurse Communication: Mobility status;Other (comment) (notified that pt wanting to shave and asking for pain meds. In addition, notified that pt seated in restroom finishing up shaving his face at end of sesison.)  Activity Tolerance: Patient tolerated treatment well Patient left: in chair;with family/visitor present;Other (comment) (chair in restroom seated sink-side finishing up shaving his face (approved by RN first) with RN and nursing student present to distribute meds.)  OT Visit Diagnosis: Unsteadiness on feet (R26.81);Muscle weakness (generalized) (M62.81);Pain Pain - Right/Left: Left Pain - part of body: Hip                Time: 1607-3710 OT Time Calculation (min): 63 min Charges:  OT General Charges $OT Visit: 1 Visit OT Evaluation $OT Eval Moderate Complexity: 1 Mod OT Treatments $Self Care/Home Management : 23-37 mins $Therapeutic Activity: 8-22 mins $Therapeutic Exercise: 8-22 mins  Rejeana Brock, MS, OTR/L ascom [phone] 04/21/20, 12:26 PM

## 2020-04-21 NOTE — TOC Progression Note (Addendum)
Transition of Care (TOC) - Progression Note    Patient Details  Name: Gregory Small MRN: [id number] Date of Birth: 08/18/1919  Transition of Care (TOC) CM/SW Contact  Elena L Muller, RN Phone Number: 04/21/2020, 1:20 PM  Clinical Narrative:    Update 13:25-[facility] stated they can accept patient for PT and OT services. Daughter notified.  No discharge date for patient at this time.  TOC will continue to follow patient.    Met with daughter at bedside, made her aware that [facility] is not in network for patient.  Provided daughter with a complete list of Home Health agencies and asked for preference.  Daughter stated first preference is [facility], second preference is [facility].  [facility] is reviewing referral information.       Expected Discharge Plan: Home w Home Health Services Barriers to Discharge: Continued Medical Work up  Expected Discharge Plan and Services Expected Discharge Plan: Home w Home Health Services     Post Acute Care Choice: Home Health Living arrangements for the past 2 months: Assisted Living Facility                                       Social Determinants of Health (SDOH) Interventions    Readmission Risk Interventions No flowsheet data found.  

## 2020-04-22 LAB — BASIC METABOLIC PANEL
Anion gap: 6 (ref 5–15)
BUN: 31 mg/dL — ABNORMAL HIGH (ref 8–23)
CO2: 31 mmol/L (ref 22–32)
Calcium: 9 mg/dL (ref 8.9–10.3)
Chloride: 99 mmol/L (ref 98–111)
Creatinine, Ser: 1.41 mg/dL — ABNORMAL HIGH (ref 0.61–1.24)
GFR, Estimated: 45 mL/min — ABNORMAL LOW (ref >60)
Glucose, Bld: 97 mg/dL (ref 70–99)
Potassium: 5.2 mmol/L — ABNORMAL HIGH (ref 3.5–5.1)
Sodium: 136 mmol/L (ref 135–145)

## 2020-04-22 MED ORDER — OXYCODONE HCL 5 MG PO TABS: 5.0000 mg | ORAL_TABLET | ORAL | 0 refills | Status: DC | PRN

## 2020-04-22 MED ORDER — OXYCODONE HCL ER 10 MG PO T12A
10.0000 mg | EXTENDED_RELEASE_TABLET | Freq: Two times a day (BID) | ORAL | 0 refills | Status: DC
Start: 2020-04-22 — End: 2020-06-14

## 2020-04-22 MED ORDER — TRAMADOL HCL 50 MG PO TABS
50.0000 mg | ORAL_TABLET | Freq: Four times a day (QID) | ORAL | 0 refills | Status: DC | PRN
Start: 2020-04-22 — End: 2020-06-14

## 2020-04-22 NOTE — TOC Progression Note (Addendum)
Transition of Care [facility]) - Progression Note    Patient Details  Name: Linken Mcglothen MRN: [id number] Date of Birth: [birth year]  [facility] CM/SW Contact  Liliana Cline, LCSW Phone Number: 04/22/2020, 11:50 AM  Clinical Narrative:   Informed Cory with [facility] of DC today.  Left VM for [place] regarding DC/transportation, waiting for return call.  1:15- Spoke with Harriett Sine at [facility]. She reported she will have to review Discharge Summary and FL2 prior to patient coming back. Faxed information. Waiting on return call from [place]. Harriett Sine reported they can transport if their Zenaida Niece is available.   1:45- Call from [place] who said they can take patient back today and daughter will transport. Harriett Sine reported they will need hard copies of FL2 and DC Summary as well. Printed to unit and asked RN to place in [employer]. Also needs hard copy of Tramadol to be given to daughter informed RN and MD.  Expected Discharge Plan: Home w Home Health Services Barriers to Discharge: Continued Medical Work up  Expected Discharge Plan and Services Expected Discharge Plan: Home w Home Health Services     Post Acute Care Choice: Home Health Living arrangements for the past 2 months: Assisted Living Facility Expected Discharge Date: 04/22/20                                     Social Determinants of Health (SDOH) Interventions    Readmission Risk Interventions No flowsheet data found.

## 2020-04-22 NOTE — NC FL2 (Signed)
[place] MEDICAID FL2 LEVEL OF CARE SCREENING TOOL     IDENTIFICATION  Patient Name: Gregory Small Birthdate: Jul 16, 1919 Sex: male Admission Date (Current Location): 04/19/2020  [place] and [place] Number:  Chiropodist and Address:  [facility], [address], [place], [place] [address]      Provider Number: [id number]  Attending Physician Name and Address:  Lonia Blood, MD  Relative Name and Phone Number:  Revonda Standard - [phone]    Current Level of Care: Hospital Recommended Level of Care: Assisted Living Facility [facility] PT and OT) Prior Approval Number:    Date Approved/Denied:   PASRR Number:    Discharge Plan:      Current Diagnoses: Patient Active Problem List   Diagnosis Date Noted  . Acute kidney injury (HCC) 04/21/2020  . Left knee pain 04/20/2020  . Chronic pain syndrome 04/20/2020  . Left hip pain 04/19/2020  . Myasthenia gravis (HCC) 04/19/2020  . Atrial fibrillation, chronic (HCC) 04/19/2020  . Hypothyroidism 04/19/2020    Orientation RESPIRATION BLADDER Height & Weight     Self,Time,Situation,Place  Normal Continent Weight: 180 lb (81.6 kg) Height:  5\' 7"  (170.2 cm)  BEHAVIORAL SYMPTOMS/MOOD NEUROLOGICAL BOWEL NUTRITION STATUS      Continent Diet (regular, thin liquids)  AMBULATORY STATUS COMMUNICATION OF NEEDS Skin   Supervision Verbally Normal                       Personal Care Assistance Level of Assistance  Bathing,Feeding,Dressing Bathing Assistance: Limited assistance Feeding assistance: Independent Dressing Assistance: Limited assistance     Functional Limitations Info  Sight (glaucoma L eye)          SPECIAL CARE FACTORS FREQUENCY  PT (By licensed PT),OT (By licensed OT)     PT Frequency: home health OT Frequency: home health            Contractures      Additional Factors Info  Code Status,Allergies Code Status Info: DNR Allergies Info: simvastatin,  sulfa antibiotics           Current Medications (04/22/2020):  This is the current hospital active medication list Current Facility-Administered Medications  Medication Dose Route Frequency Provider Last Rate Last Admin  . acetaminophen (TYLENOL) tablet 500 mg  500 mg Oral Q8H PRN Agbata, Tochukwu, MD   500 mg at 04/21/20 1107  . albuterol (VENTOLIN HFA) 108 (90 Base) MCG/ACT inhaler 1-2 puff  1-2 puff Inhalation Q6H PRN Agbata, Tochukwu, MD      . apixaban (ELIQUIS) tablet 5 mg  5 mg Oral BID Agbata, Tochukwu, MD   5 mg at 04/22/20 0930  . bisacodyl (DULCOLAX) suppository 10 mg  10 mg Rectal Daily PRN 04/24/20 T, MD      . brimonidine (ALPHAGAN) 0.2 % ophthalmic solution 1 drop  1 drop Both Eyes BID Agbata, Tochukwu, MD   1 drop at 04/22/20 0930  . calcium-vitamin D (OSCAL WITH D) 500-200 MG-UNIT per tablet 1 tablet  1 tablet Oral Daily Agbata, Tochukwu, MD   1 tablet at 04/22/20 0930  . gabapentin (NEURONTIN) capsule 100 mg  100 mg Oral QHS 04/24/20, MD   100 mg at 04/21/20 2014  . levothyroxine (SYNTHROID) tablet 88 mcg  88 mcg Oral QAC breakfast Agbata, Tochukwu, MD   88 mcg at 04/22/20 0530  . lidocaine (LIDODERM) 5 % 1 patch  1 patch Transdermal Q24H Agbata, Tochukwu, MD   1 patch at  04/21/20 1112  . magnesium citrate solution 0.5 Bottle  0.5 Bottle Oral Daily PRN Lonia Blood, MD      . mycophenolate (CELLCEPT) capsule 1,000 mg  1,000 mg Oral Daily Agbata, Tochukwu, MD   1,000 mg at 04/22/20 0929  . mycophenolate (CELLCEPT) capsule 500 mg  500 mg Oral QHS Agbata, Tochukwu, MD   500 mg at 04/21/20 2014  . ondansetron (ZOFRAN) tablet 4 mg  4 mg Oral Q6H PRN Agbata, Tochukwu, MD       Or  . ondansetron (ZOFRAN) injection 4 mg  4 mg Intravenous Q6H PRN Agbata, Tochukwu, MD      . oxyCODONE (Oxy IR/ROXICODONE) immediate release tablet 5 mg  5 mg Oral Q4H PRN Drema Dallas, MD   5 mg at 04/21/20 9675  . oxyCODONE (OXYCONTIN) 12 hr tablet 10 mg  10 mg Oral Q12H Drema Dallas, MD   10 mg at 04/22/20 9163  . pantoprazole (PROTONIX) EC tablet 40 mg  40 mg Oral Daily Agbata, Tochukwu, MD   40 mg at 04/22/20 0929  . polyethylene glycol (MIRALAX / GLYCOLAX) packet 17 g  17 g Oral BID Lonia Blood, MD   17 g at 04/22/20 0929  . predniSONE (DELTASONE) tablet 20 mg  20 mg Oral Daily Agbata, Tochukwu, MD   20 mg at 04/22/20 0930  . pyridostigmine (MESTINON) tablet 60 mg  60 mg Oral TID Lucile Shutters, MD   60 mg at 04/22/20 0929  . senna-docusate (Senokot-S) tablet 1 tablet  1 tablet Oral BID Lonia Blood, MD   1 tablet at 04/22/20 0930  . sorbitol 70 % solution 30 mL  30 mL Oral Daily PRN Jetty Duhamel T, MD      . timolol (TIMOPTIC) 0.25 % ophthalmic solution 1 drop  1 drop Both Eyes BID Agbata, Tochukwu, MD   1 drop at 04/22/20 0931  . traMADol (ULTRAM) tablet 50 mg  50 mg Oral Q6H PRN Agbata, Tochukwu, MD   50 mg at 04/22/20 1302     Discharge Medications: Please see discharge summary for a list of discharge medications.  Relevant Imaging Results:  Relevant Lab Results:   Additional Information    Meagan E Hagwood, LCSW

## 2020-04-22 NOTE — Progress Notes (Signed)
Physical Therapy Treatment Patient Details Name: Gregory Small MRN: [id number] DOB: Jul 26, 1919 Today's Date: 04/22/2020    History of Present Illness Carry "Gregory Ehlers" Small is a 87 y.o. male with medical history significant for prior lumbar fusion, A. fib on anticoagulation, hypothyroidism and myasthenia gravis who presents to the ER via EMS for evaluation of pain involving his left hip he has had for about 3 days.  He rates his pain an 8 x 10 in intensity at its worst with radiation down his left leg.  Patient sustained a mechanical fall while reaching for his walker from his bed landing on his left side.  He denies hitting his head or loss of consciousness.  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 No acute abnormality is noted.    PT Comments    Pt in bed, agreeable to session. AA/ROM used to losen LLE joints, noted worse pedal edema on Left than Right by >50%, assisted with 2 pillow elevated at end fo session. minA to EOB, then supervision to minGuard for transfers and AMB about the unit. Pt left up in bed at end of session, all needs met.    Follow Up Recommendations  Home health PT     Equipment Recommendations  None recommended by PT    Recommendations for Other Services       Precautions / Restrictions Precautions Precautions: Fall Restrictions Weight Bearing Restrictions: No    Mobility  Bed Mobility Overal bed mobility: Needs Assistance Bed Mobility: Supine to Sit;Sit to Supine     Supine to sit: Min assist Sit to supine: Supervision   General bed mobility comments: HHA for movign to EOB; unable to scoot up in bed fully without assist    Transfers Overall transfer level: Needs assistance Equipment used: Rolling walker (2 wheeled) Transfers: Sit to/from Stand Sit to Stand: Supervision            Ambulation/Gait Ambulation/Gait assistance: Min guard Gait Distance (Feet): 220 Feet Assistive device: Rolling walker (2  wheeled) Gait Pattern/deviations: WFL(Within Functional Limits) Gait velocity: 0.64m/s   General Gait Details: feels better than anticipated   Stairs             Wheelchair Mobility    Modified Rankin (Stroke Patients Only)       Balance                                            Cognition Arousal/Alertness: Awake/alert Behavior During Therapy: WFL for tasks assessed/performed Overall Cognitive Status: Within Functional Limits for tasks assessed                                        Exercises General Exercises - Lower Extremity Heel Slides: AAROM;Left;10 reps;Supine;Other (comment);Limitations Heel Slides Limitations: resultant exacerbation of anterior thigh muscle cramping Hip ABduction/ADduction: AAROM;Left;Supine;Limitations;15 reps Hip Abduction/Adduction Limitations: more difficulty with adduction than ABDCT    General Comments        Pertinent Vitals/Pain Pain Score: 8  Pain Location: Left anterior thigh muscle cramping Pain Descriptors / Indicators: Cramping Pain Intervention(s): Limited activity within patient's tolerance;Monitored during session;Repositioned    Home Living                      Prior Function  PT Goals (current goals can now be found in the care plan section) Acute Rehab PT Goals Patient Stated Goal: get better and get released from hospital ASAP PT Goal Formulation: With patient/family Time For Goal Achievement: 05/04/20 Potential to Achieve Goals: Good Progress towards PT goals: Progressing toward goals    Frequency    Min 2X/week      PT Plan Current plan remains appropriate    Co-evaluation              AM-PAC PT "6 Clicks" Mobility   Outcome Measure  Help needed turning from your back to your side while in a flat bed without using bedrails?: A Little Help needed moving from lying on your back to sitting on the side of a flat bed without using  bedrails?: A Little Help needed moving to and from a bed to a chair (including a wheelchair)?: A Little Help needed standing up from a chair using your arms (e.g., wheelchair or bedside chair)?: A Little Help needed to walk in hospital room?: A Little Help needed climbing 3-5 steps with a railing? : A Little 6 Click Score: 18    End of Session Equipment Utilized During Treatment: Gait belt Activity Tolerance: Patient limited by pain;Patient limited by fatigue;Patient tolerated treatment well Patient left: in bed;with call bell/phone within reach;with bed alarm set;with family/visitor present Nurse Communication: Mobility status PT Visit Diagnosis: Unsteadiness on feet (R26.81);Other abnormalities of gait and mobility (R26.89);Muscle weakness (generalized) (M62.81);History of falling (Z91.81);Difficulty in walking, not elsewhere classified (R26.2);Dizziness and giddiness (R42);Pain Pain - Right/Left: Left Pain - part of body: Leg     Time: 1035-1050 PT Time Calculation (min) (ACUTE ONLY): 15 min  Charges:  $Therapeutic Activity: 8-22 mins                     11:11 AM, 04/22/20 Etta Grandchild, PT, DPT Physical Therapist - [facility]  [phone] ([place])     Kenesaw C 04/22/2020, 11:10 AM

## 2020-04-22 NOTE — Plan of Care (Signed)

## 2020-04-22 NOTE — Discharge Instructions (Signed)

## 2020-04-22 NOTE — TOC Transition Note (Addendum)
Transition of Care [facility]) - CM/SW Discharge Note   Patient Details  Name: Gregory Small MRN: [id number] Date of Birth: [birth year]  Transition of Care [facility]) CM/SW Contact:  Liliana Cline, LCSW Phone Number: 04/22/2020, 1:49 PM   Clinical Narrative:   Patient discharging back to [facility] ALF. Confirmed with Harriett Sine at [facility]. Informed Cory with [facility] who will be following for PT and OT services. Asked for HH orders. Daughter to provide transportation back to ALF.   Final next level of care: Assisted Living Barriers to Discharge: Barriers Resolved   Patient Goals and CMS Choice Patient states their goals for this hospitalization and ongoing recovery are:: return to ALF CMS Medicare.gov Compare Post Acute Care list provided to:: Patient Represenative (must comment) Choice offered to / list presented to : Adult Children  Discharge Placement                Patient to be transferred to facility by: daughter Name of family member notified: Daughter aware per RN Patient and family notified of of transfer: 04/22/20  Discharge Plan and Services     Post Acute Care Choice: Home Health                    HH Arranged: PT,OT [facility] Agency: [facility] Date [facility] Agency Contacted: 04/22/20   Representative spoke with at [facility] Agency: Lorenza Chick  Social Determinants of Health (SDOH) Interventions     Readmission Risk Interventions No flowsheet data found.

## 2020-04-22 NOTE — Discharge Summary (Addendum)
DISCHARGE SUMMARY  Gregory Small  MR#: [id number]  DOB:Jul 12, 1919  Date of Admission: 04/19/2020 Date of Discharge: 04/22/2020  Attending Physician:Jeffrey Silvestre Gunner, MD  Patient's HYW:[id number], [facility]  Consults: none   Disposition: D/C to ALF w/ [facility] services    Follow-up Appts:  Follow-up Information    [facility] Follow up.   Specialty: Home Health Services Why: They will follow up with you for your home health therapy needs. Contact information: [address] [place] [address] [phone]        [facility] Follow up in 1 week(s).   Specialty: Geriatric Medicine Contact information: [address] [place] [address] [phone]               Discharge Diagnoses: Mechanical fall with refractory left hip and acute on chronic knee pain Acute renal injury Chronic atrial fibrillation Myasthenia gravis Hypothyroidism NCB - DNR    Initial presentation: 87yo ALF resident with a history of prior lumbar fusion, chronic atrial fibrillation on anticoagulation, hypothyroidism, and myasthenia gravis who presented to the [facility] ER with complaints of severe pain involving his left hip x3 days with radiation down into his left leg after sustaining a mechanical fall while attempting to reach his walker from his bed. In the ED x-rays revealed no evidence of left hip fracture. Lumbar spine x-rays were likewise without acute abnormality appreciable. CT scan of the pelvis revealed no acute osseous abnormality but did note focal subcutaneous edema lateral to the left greater trochanter without a hematoma.  Hospital Course:  Mechanical fall with refractory left hip and acute on chronic knee pain X-rays without evidence of acute fracture -slowly improving with persisting pain from soft tissue injury - continue PT/OT via home health   Acute renal injury Creatinine was noted to be climbing following  presentation c/w prerenal azotemia due to poor intake and acute injury - w/ IVF support his crt improved - his baseline appears to be ~1.35 per outpt records provided by his daughter at the bedside   Chronic atrial fibrillation At this point the likelihood of a bleeding complication related to apixaban appears to outweigh any potential benefit he would get from ongoing anticoagulation - discussed with patient and family prior to discharge  Myasthenia gravis Continue usual outpatient medications  Hypothyroidism Continue usual home Synthroid dose  Hx clarification: I was unable to meet the patient's daughter in the room on the day of his discharge and therefore I called her to discuss our treatment plan.  She provided further history to me revealing that the patient had been diagnosed [place] pulmonary [place].  She felt this was the primary indication for his Eliquis dosing.  She further reported that he was using Lasix because he developed severe pedal edema in its absence.  I discussed with her my concern that he remained a very high fall risk and that ongoing use of Eliquis placed him in extreme danger of significant bleeding complications.  I advised that we stop his Eliquis for now given that he has completed approximately 4-5 months of full anticoagulation in the face of very high bleeding risk.  I explained that he also had risk of stroke related to his atrial fibrillation in the absence of Eliquis, as well as recurrent pulmonary emboli or DVT formation but that at present I was concerned that his risk of a life-threatening bleeding event was even higher.  I further explained that  I wish to hold his Lasix until he was eating and drinking more consistently and that it could be used on a as needed basis should he develop pedal edema in the future.  She voiced understanding of these points of discussion and we agreed to this plan.   Allergies as  of 04/22/2020      Reactions   Simvastatin Other (See Comments)   Myalgias    Sulfa Antibiotics       Medication List    STOP taking these medications   Eliquis 5 MG Tabs tablet Generic drug: apixaban   furosemide 20 MG tablet Commonly known as: LASIX     TAKE these medications   acetaminophen 500 MG tablet Commonly known as: TYLENOL Take 500-1,000 mg by mouth every 8 (eight) hours as needed for pain.   albuterol 108 (90 Base) MCG/ACT inhaler Commonly known as: VENTOLIN HFA Inhale 1-2 puffs into the lungs every 6 (six) hours as needed for wheezing or shortness of breath.   brimonidine 0.2 % ophthalmic solution Commonly known as: ALPHAGAN Place 1 drop into both eyes 2 (two) times daily.   Caltrate 600+D Plus Minerals 600-800 MG-UNIT Tabs Take 1 tablet by mouth daily.   levothyroxine 88 MCG tablet Commonly known as: SYNTHROID Take 88 mcg by mouth daily before breakfast.   mycophenolate 500 MG tablet Commonly known as: CELLCEPT Take 500-1,000 mg by mouth 2 (two) times daily.   oxyCODONE 10 mg 12 hr tablet Commonly known as: OXYCONTIN Take 1 tablet (10 mg total) by mouth every 12 (twelve) hours.   oxyCODONE 5 MG immediate release tablet Commonly known as: Oxy IR/ROXICODONE Take 1 tablet (5 mg total) by mouth every 4 (four) hours as needed for breakthrough pain.   pantoprazole 40 MG tablet Commonly known as: PROTONIX Take 40 mg by mouth daily.   predniSONE 20 MG tablet Commonly known as: DELTASONE Take 20 mg by mouth daily.   pyridostigmine 60 MG tablet Commonly known as: MESTINON Take 60 mg by mouth 3 (three) times daily.   timolol 0.25 % ophthalmic solution Commonly known as: TIMOPTIC Apply 1 drop to eye 2 (two) times daily.   traMADol 50 MG tablet Commonly known as: ULTRAM Take 1 tablet (50 mg total) by mouth every 6 (six) hours as needed for severe pain.       Day of Discharge BP 114/68 (BP Location: Left Arm)   Pulse 67   Temp 97.6 F (36.4  C) (Oral)   Resp 18   Ht 5\' 7"  (1.702 m)   Wt 81.6 kg   SpO2 98%   BMI 28.19 kg/m   Physical Exam: General: No acute respiratory distress Lungs: Clear to auscultation bilaterally without wheezes or crackles Cardiovascular: Regular rate and rhythm without murmur gallop or rub normal S1 and S2 Abdomen: Nontender, nondistended, soft, bowel sounds positive, no rebound, no ascites, no appreciable mass Extremities: No significant cyanosis, clubbing, or edema bilateral lower extremities  Basic Metabolic Panel: Recent Labs  Lab 04/19/20 0701 04/20/20 0416 04/21/20 0440 04/22/20 0434  NA 139 137 136 136  K 4.0 4.4 4.5 5.2*  CL 100 99 97* 99  CO2 30 31 31 31   GLUCOSE 95 111* 89 97  BUN 23 24* 29* 31*  CREATININE 1.37* 1.36* 1.49* 1.41*  CALCIUM 8.9 8.8* 8.9 9.0  MG  --   --  2.2  --   PHOS  --   --  4.3  --     Liver Function Tests: Recent  Labs  Lab 04/19/20 0701 04/21/20 0440  AST 19 21  ALT 14 14  ALKPHOS 52 55  BILITOT 0.8 1.1  PROT 6.2* 5.9*  ALBUMIN 3.7 3.4*    CBC: Recent Labs  Lab 04/19/20 0701 04/20/20 0416 04/21/20 0440  WBC 11.5* 10.8* 11.8*  NEUTROABS  --   --  7.8*  HGB 13.5 12.7* 13.2  HCT 43.3 40.1 39.8  MCV 96.0 95.5 93.0  PLT 258 216 227    Recent Results (from the past 240 hour(s))  Resp Panel by RT-PCR (Flu A&B, Covid) Nasopharyngeal Swab     Status: None   Collection Time: 04/19/20 11:12 AM   Specimen: Nasopharyngeal Swab; Nasopharyngeal(NP) swabs in vial transport medium  Result Value Ref Range Status   SARS Coronavirus 2 by RT PCR NEGATIVE NEGATIVE Final    Comment: (NOTE) SARS-CoV-2 target nucleic acids are NOT DETECTED.  The SARS-CoV-2 RNA is generally detectable in upper respiratory specimens during the acute phase of infection. The lowest concentration of SARS-CoV-2 viral copies this assay can detect is 138 copies/mL. A negative result does not preclude SARS-Cov-2 infection and should not be used as the sole basis for treatment  or other patient management decisions. A negative result may occur with  improper specimen collection/handling, submission of specimen other than nasopharyngeal swab, presence of viral mutation(s) within the areas targeted by this assay, and inadequate number of viral copies(<138 copies/mL). A negative result must be combined with clinical observations, patient history, and epidemiological information. The expected result is Negative.  Fact Sheet for Patients:  BloggerCourse.com  Fact Sheet for Healthcare Providers:  SeriousBroker.it  This test is no t yet approved or cleared by the [place] FDA and  has been authorized for detection and/or diagnosis of SARS-CoV-2 by FDA under an Emergency Use Authorization (EUA). This EUA will remain  in effect (meaning this test can be used) for the duration of the COVID-19 declaration under Section 564(b)(1) of the Act, 21 U.S.C.section 360bbb-3(b)(1), unless the authorization is terminated  or revoked sooner.       Influenza A by PCR NEGATIVE NEGATIVE Final   Influenza B by PCR NEGATIVE NEGATIVE Final    Comment: (NOTE) The Xpert Xpress SARS-CoV-2/FLU/RSV plus assay is intended as an aid in the diagnosis of influenza from Nasopharyngeal swab specimens and should not be used as a sole basis for treatment. Nasal washings and aspirates are unacceptable for Xpert Xpress SARS-CoV-2/FLU/RSV testing.  Fact Sheet for Patients: BloggerCourse.com  Fact Sheet for Healthcare Providers: SeriousBroker.it  This test is not yet approved or cleared by the [place] FDA and has been authorized for detection and/or diagnosis of SARS-CoV-2 by FDA under an Emergency Use Authorization (EUA). This EUA will remain in effect (meaning this test can be used) for the duration of the COVID-19 declaration under Section 564(b)(1) of the Act, 21 U.S.C. section  360bbb-3(b)(1), unless the authorization is terminated or revoked.  Performed at [facility], [address] [place], [place] [address]       Time spent in discharge (includes decision making & examination of pt): 35 minutes  04/22/2020, 2:15 PM   Lonia Blood, MD [facility] Office  [phone]

## 2020-04-27 ENCOUNTER — Inpatient Hospital Stay
Admission: EM | Admit: 2020-04-27 | Discharge: 2020-04-30 | DRG: 149 | Disposition: A | Discharge status: Home / Self Care | Payer: Medicare Other | Source: Skilled Nursing Facility | Attending: Internal Medicine | Admitting: Internal Medicine

## 2020-04-27 ENCOUNTER — Emergency Department: Payer: Medicare Other

## 2020-04-27 ENCOUNTER — Other Ambulatory Visit: Payer: Self-pay

## 2020-04-27 DIAGNOSIS — M25552 Pain in left hip: Secondary | ICD-10-CM | POA: Diagnosis present

## 2020-04-27 DIAGNOSIS — E039 Hypothyroidism, unspecified: Secondary | ICD-10-CM | POA: Diagnosis present

## 2020-04-27 DIAGNOSIS — R42 Dizziness and giddiness: Secondary | ICD-10-CM | POA: Diagnosis not present

## 2020-04-27 DIAGNOSIS — H409 Unspecified glaucoma: Secondary | ICD-10-CM | POA: Diagnosis present

## 2020-04-27 DIAGNOSIS — Z66 Do not resuscitate: Secondary | ICD-10-CM | POA: Diagnosis present

## 2020-04-27 DIAGNOSIS — Z888 Allergy status to other drugs, medicaments and biological substances status: Secondary | ICD-10-CM

## 2020-04-27 DIAGNOSIS — K219 Gastro-esophageal reflux disease without esophagitis: Secondary | ICD-10-CM | POA: Diagnosis present

## 2020-04-27 DIAGNOSIS — Z9181 History of falling: Secondary | ICD-10-CM

## 2020-04-27 DIAGNOSIS — I10 Essential (primary) hypertension: Secondary | ICD-10-CM | POA: Diagnosis present

## 2020-04-27 DIAGNOSIS — H8110 Benign paroxysmal vertigo, unspecified ear: Secondary | ICD-10-CM | POA: Diagnosis not present

## 2020-04-27 DIAGNOSIS — R112 Nausea with vomiting, unspecified: Secondary | ICD-10-CM | POA: Diagnosis present

## 2020-04-27 DIAGNOSIS — Z7952 Long term (current) use of systemic steroids: Secondary | ICD-10-CM

## 2020-04-27 DIAGNOSIS — Z20822 Contact with and (suspected) exposure to covid-19: Secondary | ICD-10-CM | POA: Diagnosis present

## 2020-04-27 DIAGNOSIS — K5909 Other constipation: Secondary | ICD-10-CM | POA: Diagnosis present

## 2020-04-27 DIAGNOSIS — Z79899 Other long term (current) drug therapy: Secondary | ICD-10-CM

## 2020-04-27 DIAGNOSIS — I482 Chronic atrial fibrillation, unspecified: Secondary | ICD-10-CM | POA: Diagnosis present

## 2020-04-27 DIAGNOSIS — Z79890 Hormone replacement therapy (postmenopausal): Secondary | ICD-10-CM

## 2020-04-27 DIAGNOSIS — Z882 Allergy status to sulfonamides status: Secondary | ICD-10-CM

## 2020-04-27 DIAGNOSIS — G7000 Myasthenia gravis without (acute) exacerbation: Secondary | ICD-10-CM | POA: Diagnosis present

## 2020-04-27 HISTORY — DX: Myasthenia gravis without (acute) exacerbation: G70.00

## 2020-04-27 LAB — CBC WITH DIFFERENTIAL/PLATELET
Abs Immature Granulocytes: 0.17 10*3/uL — ABNORMAL HIGH (ref 0.00–0.07)
Basophils Absolute: 0 10*3/uL (ref 0.0–0.1)
Basophils Relative: 0 %
Eosinophils Absolute: 0 10*3/uL (ref 0.0–0.5)
Eosinophils Relative: 0 %
HCT: 39.2 % (ref 39.0–52.0)
Hemoglobin: 12.2 g/dL — ABNORMAL LOW (ref 13.0–17.0)
Immature Granulocytes: 2 %
Lymphocytes Relative: 11 %
Lymphs Abs: 1.1 10*3/uL (ref 0.7–4.0)
MCH: 29.8 pg (ref 26.0–34.0)
MCHC: 31.1 g/dL (ref 30.0–36.0)
MCV: 95.8 fL (ref 80.0–100.0)
Monocytes Absolute: 0.5 10*3/uL (ref 0.1–1.0)
Monocytes Relative: 5 %
Neutro Abs: 8.6 10*3/uL — ABNORMAL HIGH (ref 1.7–7.7)
Neutrophils Relative %: 82 %
Platelets: 227 10*3/uL (ref 150–400)
RBC: 4.09 MIL/uL — ABNORMAL LOW (ref 4.22–5.81)
RDW: 15.1 % (ref 11.5–15.5)
WBC: 10.5 10*3/uL (ref 4.0–10.5)
nRBC: 0 % (ref 0.0–0.2)

## 2020-04-27 LAB — COMPREHENSIVE METABOLIC PANEL
ALT: 35 U/L (ref 0–44)
AST: 37 U/L (ref 15–41)
Albumin: 3.4 g/dL — ABNORMAL LOW (ref 3.5–5.0)
Alkaline Phosphatase: 52 U/L (ref 38–126)
Anion gap: 9 (ref 5–15)
BUN: 24 mg/dL — ABNORMAL HIGH (ref 8–23)
CO2: 24 mmol/L (ref 22–32)
Calcium: 8.1 mg/dL — ABNORMAL LOW (ref 8.9–10.3)
Chloride: 106 mmol/L (ref 98–111)
Creatinine, Ser: 1.17 mg/dL (ref 0.61–1.24)
GFR, Estimated: 56 mL/min — ABNORMAL LOW (ref >60)
Glucose, Bld: 152 mg/dL — ABNORMAL HIGH (ref 70–99)
Potassium: 3.6 mmol/L (ref 3.5–5.1)
Sodium: 139 mmol/L (ref 135–145)
Total Bilirubin: 0.7 mg/dL (ref 0.3–1.2)
Total Protein: 6 g/dL — ABNORMAL LOW (ref 6.5–8.1)

## 2020-04-27 LAB — TROPONIN I (HIGH SENSITIVITY): Troponin I (High Sensitivity): 13 ng/L (ref <18)

## 2020-04-27 LAB — RESP PANEL BY RT-PCR (FLU A&B, COVID) ARPGX2
Influenza A by PCR: NEGATIVE
Influenza B by PCR: NEGATIVE
SARS Coronavirus 2 by RT PCR: NEGATIVE

## 2020-04-27 MED ORDER — ONDANSETRON HCL 4 MG/2ML IJ SOLN
4.0000 mg | Freq: Once | INTRAMUSCULAR | Status: AC
  Administered 2020-04-27: 4 mg via INTRAVENOUS
  Filled 2020-04-27: qty 2

## 2020-04-27 MED ORDER — LORAZEPAM 2 MG/ML IJ SOLN: 0.5000 mg | Freq: Once | INTRAMUSCULAR | Status: DC

## 2020-04-27 MED ORDER — LACTATED RINGERS IV BOLUS
500.0000 mL | Freq: Once | INTRAVENOUS | Status: AC
  Administered 2020-04-28: 500 mL via INTRAVENOUS

## 2020-04-27 MED ORDER — FENTANYL CITRATE (PF) 100 MCG/2ML IJ SOLN
50.0000 ug | Freq: Once | INTRAMUSCULAR | Status: AC
  Administered 2020-04-28: 50 ug via INTRAVENOUS
  Filled 2020-04-27: qty 2

## 2020-04-27 NOTE — ED Provider Notes (Signed)
[facility] Emergency Department Provider Note  ____________________________________________   Event Date/Time   First MD Initiated Contact with Patient 04/27/20 2126     (approximate)  I have reviewed the triage vital signs and the nursing notes.   HISTORY  Chief Complaint Dizziness, Vomiting, and Hip Pain   HPI Gregory Small is a 87 y.o. male the past medical history of HTN, hypothyroidism, myasthenia gravis, chronic A. fib not anticoagulated and recent fall on 2/21 onto the left hip with unremarkable ED work-up including CTs of the left hip who presents via EMS from nursing facility for assessment of headache associated with nonbloody nonbilious emesis and dizziness that began today.  Patient denies any vision changes, eye pain, chest pain, cough, shortness of breath, Donnell pain, diarrhea, dysuria or any new pain in his hip or subsequent falls since he last fell.  He denies any new pain in his arms or legs or new weakness numbness or tingling.  States he has not been able to ambulate since his fall secondary to pain in his left hip.  No clearly getting a rating factors regarding his nausea vomiting headache.  He did receive 4 mg of Zofran by EMS prior to arrival.      Past Medical History:  Diagnosis Date  . Hypertension   . Hypothyroidism     Patient Active Problem List   Diagnosis Date Noted  . Acute kidney injury (HCC) 04/21/2020  . Left knee pain 04/20/2020  . Chronic pain syndrome 04/20/2020  . Left hip pain 04/19/2020  . Myasthenia gravis (HCC) 04/19/2020  . Atrial fibrillation, chronic (HCC) 04/19/2020  . Hypothyroidism 04/19/2020    Past Surgical History:  Procedure Laterality Date  . BACK SURGERY      Prior to Admission medications   Medication Sig Start Date End Date Taking? Authorizing Provider  acetaminophen (TYLENOL) 500 MG tablet Take 500-1,000 mg by mouth every 8 (eight) hours as needed for pain. 12/24/19   [provider]  albuterol (VENTOLIN HFA) 108 (90 Base) MCG/ACT inhaler Inhale 1-2 puffs into the lungs every 6 (six) hours as needed for wheezing or shortness of breath. 04/15/20   [provider]  brimonidine (ALPHAGAN) 0.2 % ophthalmic solution Place 1 drop into both eyes 2 (two) times daily. 04/12/20   [provider]  Calcium Carbonate-Vit D-Min (CALTRATE 600+D PLUS MINERALS) 600-800 MG-UNIT TABS Take 1 tablet by mouth daily.    [provider]  levothyroxine (SYNTHROID) 88 MCG tablet Take 88 mcg by mouth daily before breakfast. 11/12/19   [provider]  mycophenolate (CELLCEPT) 500 MG tablet Take 500-1,000 mg by mouth 2 (two) times daily. 04/14/20   [provider]  oxyCODONE (OXY IR/ROXICODONE) 5 MG immediate release tablet Take 1 tablet (5 mg total) by mouth every 4 (four) hours as needed for breakthrough pain. 04/22/20   Lonia BloodMcClung, Jeffrey T, MD  oxyCODONE (OXYCONTIN) 10 mg 12 hr tablet Take 1 tablet (10 mg total) by mouth every 12 (twelve) hours. 04/22/20   Lonia BloodMcClung, Jeffrey T, MD  pantoprazole (PROTONIX) 40 MG tablet Take 40 mg by mouth daily. 04/14/20   [provider]  predniSONE (DELTASONE) 20 MG tablet Take 20 mg by mouth daily. 04/14/20   [provider]  pyridostigmine (MESTINON) 60 MG tablet Take 60 mg by mouth 3 (three) times daily. 04/14/20   [provider]  timolol (TIMOPTIC) 0.25 % ophthalmic solution Apply 1 drop to eye 2 (two) times daily. 10/22/19  [provider]  traMADol (ULTRAM) 50 MG tablet Take 1 tablet (50 mg total) by mouth every 6 (six) hours as needed for severe pain. 04/22/20   Lonia Blood, MD    Allergies Simvastatin and Sulfa antibiotics  Family History  Family history unknown: Yes    Social History Social History   Tobacco Use  . Smoking status: Never Smoker  Substance Use Topics  . Alcohol use: Not Currently  . Drug use: Never    Review of Systems  Review of Systems   Constitutional: Negative for chills and fever.  HENT: Negative for sore throat.   Eyes: Negative for pain.  Respiratory: Negative for cough and stridor.   Cardiovascular: Negative for chest pain.  Gastrointestinal: Positive for nausea and vomiting.  Genitourinary: Negative for dysuria.  Musculoskeletal: Positive for myalgias ( L hip).  Skin: Negative for rash.  Neurological: Positive for dizziness, weakness ( L hip ) and headaches. Negative for seizures and loss of consciousness.  Psychiatric/Behavioral: Negative for suicidal ideas.  All other systems reviewed and are negative.     ____________________________________________   PHYSICAL EXAM:  VITAL SIGNS: ED Triage Vitals  Enc Vitals Group     BP      Pulse      Resp      Temp      Temp src      SpO2      Weight      Height      Head Circumference      Peak Flow      Pain Score      Pain Loc      Pain Edu?      Excl. in GC?    Vitals:   04/27/20 2200 04/27/20 2230  BP: (!) 137/91 90/69  Pulse: 68   Resp: 16 20  Temp:    SpO2: 99%    Physical Exam Vitals and nursing note reviewed.  Constitutional:      Appearance: He is well-developed and well-nourished.  HENT:     Head: Normocephalic and atraumatic.     Right Ear: External ear normal.     Left Ear: External ear normal.  Eyes:     Conjunctiva/sclera: Conjunctivae normal.  Cardiovascular:     Rate and Rhythm: Normal rate and regular rhythm.     Heart sounds: No murmur heard.   Pulmonary:     Effort: Pulmonary effort is normal. No respiratory distress.     Breath sounds: Normal breath sounds.  Abdominal:     Palpations: Abdomen is soft.     Tenderness: There is no abdominal tenderness.  Musculoskeletal:        General: No edema.     Cervical back: Neck supple.     Left hip: Decreased range of motion. Decreased strength.  Skin:    General: Skin is warm and dry.  Neurological:     Mental Status: He is alert.  Psychiatric:        Mood and  Affect: Mood and affect normal.     No pronator drift.  No finger dysmetria.  Cranial nerves II through XII grossly intact.  Patient has full symmetric strength of his bile upper extremities.  He has full strength of his right lower extremity.  He has full strength at the left knee and ankle but is unable to flex or extend at the left hip which he states is secondary to pain.  No tenderness step-offs or deformities over the T/L-spine.  Mild tenderness over the left lateral and posterior hip.  TMs unremarkable bilaterally.  Patient is oriented to year but stated he thought it was February.  He is oriented to being in the hospital.  This is baseline per family at bedside. ____________________________________________   LABS (all labs ordered are listed, but only abnormal results are displayed)  Labs Reviewed  CBC WITH DIFFERENTIAL/PLATELET - Abnormal; Notable for the following components:      Result Value   RBC 4.09 (*)    Hemoglobin 12.2 (*)    Neutro Abs 8.6 (*)    Abs Immature Granulocytes 0.17 (*)    All other components within normal limits  COMPREHENSIVE METABOLIC PANEL - Abnormal; Notable for the following components:   Glucose, Bld 152 (*)    BUN 24 (*)    Calcium 8.1 (*)    Total Protein 6.0 (*)    Albumin 3.4 (*)    GFR, Estimated 56 (*)    All other components within normal limits  RESP PANEL BY RT-PCR (FLU A&B, COVID) ARPGX2  TROPONIN I (HIGH SENSITIVITY)  TROPONIN I (HIGH SENSITIVITY)   ____________________________________________  EKG  A. fib with a ventricular rate of 70, normal axis, unremarkable's and no clear evidence of acute ischemia or other significant underlying arrhythmia. ____________________________________________  RADIOLOGY  ED MD interpretation: CT head shows evidence of atrophy and small vessel ischemic changes without any other acute changes or SAH.  Chest x-ray is unremarkable.  Official radiology report(s): DG Chest 1 View  Result Date:  04/27/2020 CLINICAL DATA:  Dizziness, vomiting EXAM: CHEST  1 VIEW COMPARISON:  None. FINDINGS: Single frontal view of the chest demonstrates an unremarkable cardiac silhouette. No acute airspace disease, effusion, or pneumothorax. No acute bony abnormalities. IMPRESSION: 1. No acute intrathoracic process. Electronically Signed   By: Sharlet Salina M.D.   On: 04/27/2020 21:48   CT Head Wo Contrast  Result Date: 04/27/2020 CLINICAL DATA:  Headache dizzy EXAM: CT HEAD WITHOUT CONTRAST TECHNIQUE: Contiguous axial images were obtained from the base of the skull through the vertex without intravenous contrast. COMPARISON:  MRI 04/12/2009 FINDINGS: Brain: No acute territorial infarction, hemorrhage or intracranial mass. Moderate atrophy. Advanced white matter disease likely chronic small vessel ischemic change. Mildly prominent ventricles felt secondary to atrophy Vascular: No hyperdense vessels. Vertebral and carotid vascular calcification Skull: Normal. Negative for fracture or focal lesion. Sinuses/Orbits: No acute finding. Other: None IMPRESSION: 1. No CT evidence for acute intracranial abnormality. 2. Atrophy and small vessel ischemic changes of the white matter. Electronically Signed   By: Jasmine Pang M.D.   On: 04/27/2020 22:25    ____________________________________________   PROCEDURES  Procedure(s) performed (including Critical Care):  .1-3 Lead EKG Interpretation Performed by: Gilles Chiquito, MD Authorized by: Gilles Chiquito, MD     Interpretation: abnormal     ECG rate assessment: normal     Rhythm: atrial fibrillation     Ectopy: none     Conduction: normal       ____________________________________________   INITIAL IMPRESSION / ASSESSMENT AND PLAN / ED COURSE     Patient presents with above to history exam for assessment of mild headache associate with vertigo and nonbloody nonbilious vomiting that began today.  This is in the setting of some left hip pain that he has  been experiencing since a fall on 2/21.  He denies any subsequent falls.  He denies any other acute sick symptoms.  On arrival he is afebrile and hemodynamically stable.  He has a nonfocal supine neuro exam and has not been able to ambulate since his fall.  Primary differential includes possible peripheral vertigo, CVA, atypical presentation of ACS, and metabolic derangements.  No history of interim trauma or new pain since 2/21.  Chest x-ray has no evidence of pneumonia or other acute thoracic process.  ECG and initial troponin not suggestive of ACS we will plan obtain a repeat troponin.  CT head shows no evidence of SAH or other clear acute process.  However given concern for possible CVA will plan to obtain MRI.  CBC shows no leukocytosis or acute anemia.  CMP shows glucose of 152 without any other significant electrolyte or metabolic derangements.  Covid is negative.  On reassessment patient stated he felt a little better but still experiencing persistent vertigo was not positional or exacerbated by movement of his head.  Care patient signed over to oncoming provider at approximately 2300.  Plan follow-up MRI brain and reassess and if this is reassuring and patient is feeling better he likely safe for discharge back to his nursing facility.      ____________________________________________   FINAL CLINICAL IMPRESSION(S) / ED DIAGNOSES  Final diagnoses:  Left hip pain  Vertigo  Nausea and vomiting, intractability of vomiting not specified, unspecified vomiting type    Medications  fentaNYL (SUBLIMAZE) injection 50 mcg (has no administration in time range)  LORazepam (ATIVAN) injection 0.5 mg (has no administration in time range)  lactated ringers bolus 500 mL (has no administration in time range)  ondansetron (ZOFRAN) injection 4 mg (4 mg Intravenous Given 04/27/20 2150)     ED Discharge Orders    None       Note:  This document was prepared using Dragon voice recognition  software and may include unintentional dictation errors.   Gilles Chiquito, MD 04/27/20 [phone]

## 2020-04-27 NOTE — ED Notes (Signed)
Pt's granddaughter/POA Katy Apo at bedside at this time and updated on POC.

## 2020-04-27 NOTE — ED Triage Notes (Signed)
Pt presents to the [facility] via EMS from [facility] with c/o dizziness, vomiting, and left hip pain. Pt states that earlier today he began to feel dizzy then had several episodes of vomiting. EMS administered 4mg  IV zofran en route. Pt also c/o left hip pain related to recent fall and admission on 04/19/20.

## 2020-04-28 ENCOUNTER — Encounter: Payer: Self-pay | Admitting: Family Medicine

## 2020-04-28 DIAGNOSIS — E039 Hypothyroidism, unspecified: Secondary | ICD-10-CM | POA: Diagnosis not present

## 2020-04-28 DIAGNOSIS — R42 Dizziness and giddiness: Secondary | ICD-10-CM

## 2020-04-28 DIAGNOSIS — K219 Gastro-esophageal reflux disease without esophagitis: Secondary | ICD-10-CM

## 2020-04-28 DIAGNOSIS — R112 Nausea with vomiting, unspecified: Secondary | ICD-10-CM

## 2020-04-28 DIAGNOSIS — H8110 Benign paroxysmal vertigo, unspecified ear: Secondary | ICD-10-CM | POA: Diagnosis not present

## 2020-04-28 LAB — BASIC METABOLIC PANEL
Anion gap: 9 (ref 5–15)
BUN: 22 mg/dL (ref 8–23)
CO2: 27 mmol/L (ref 22–32)
Calcium: 8.7 mg/dL — ABNORMAL LOW (ref 8.9–10.3)
Chloride: 106 mmol/L (ref 98–111)
Creatinine, Ser: 1.1 mg/dL (ref 0.61–1.24)
GFR, Estimated: >60 mL/min (ref >60)
Glucose, Bld: 93 mg/dL (ref 70–99)
Potassium: 4.6 mmol/L (ref 3.5–5.1)
Sodium: 142 mmol/L (ref 135–145)

## 2020-04-28 LAB — URINALYSIS, COMPLETE (UACMP) WITH MICROSCOPIC
Bacteria, UA: NONE SEEN
Bilirubin Urine: NEGATIVE
Glucose, UA: NEGATIVE mg/dL
Hgb urine dipstick: NEGATIVE
Ketones, ur: NEGATIVE mg/dL
Leukocytes,Ua: NEGATIVE
Nitrite: NEGATIVE
Protein, ur: 30 mg/dL — AB
Specific Gravity, Urine: 1.028 (ref 1.005–1.030)
Squamous Epithelial / HPF: NONE SEEN (ref 0–5)
pH: 5 (ref 5.0–8.0)

## 2020-04-28 LAB — CBC
HCT: 39.9 % (ref 39.0–52.0)
HCT: 39 % (ref 39.0–52.0)
Hemoglobin: 12.7 g/dL — ABNORMAL LOW (ref 13.0–17.0)
Hemoglobin: 12 g/dL — ABNORMAL LOW (ref 13.0–17.0)
MCH: 30.5 pg (ref 26.0–34.0)
MCH: 29.8 pg (ref 26.0–34.0)
MCHC: 31.8 g/dL (ref 30.0–36.0)
MCHC: 30.8 g/dL (ref 30.0–36.0)
MCV: 95.7 fL (ref 80.0–100.0)
MCV: 96.8 fL (ref 80.0–100.0)
Platelets: 205 10*3/uL (ref 150–400)
Platelets: 208 10*3/uL (ref 150–400)
RBC: 4.17 MIL/uL — ABNORMAL LOW (ref 4.22–5.81)
RBC: 4.03 MIL/uL — ABNORMAL LOW (ref 4.22–5.81)
RDW: 15.4 % (ref 11.5–15.5)
RDW: 15.4 % (ref 11.5–15.5)
WBC: 13.7 10*3/uL — ABNORMAL HIGH (ref 4.0–10.5)
WBC: 10.7 10*3/uL — ABNORMAL HIGH (ref 4.0–10.5)
nRBC: 0 % (ref 0.0–0.2)
nRBC: 0 % (ref 0.0–0.2)

## 2020-04-28 LAB — TSH: TSH: 0.872 u[IU]/mL (ref 0.350–4.500)

## 2020-04-28 LAB — TROPONIN I (HIGH SENSITIVITY): Troponin I (High Sensitivity): 15 ng/L (ref <18)

## 2020-04-28 MED ORDER — MECLIZINE HCL 25 MG PO TABS
25.0000 mg | ORAL_TABLET | Freq: Once | ORAL | Status: AC
  Administered 2020-04-28: 25 mg via ORAL
  Filled 2020-04-28: qty 1

## 2020-04-28 MED ORDER — ACETAMINOPHEN 325 MG PO TABS: 650.0000 mg | ORAL_TABLET | Freq: Four times a day (QID) | ORAL | Status: DC | PRN

## 2020-04-28 MED ORDER — MAGNESIUM HYDROXIDE 400 MG/5ML PO SUSP: 30.0000 mL | Freq: Every day | ORAL | Status: DC | PRN

## 2020-04-28 MED ORDER — PREDNISONE 20 MG PO TABS
20.0000 mg | ORAL_TABLET | Freq: Every day | ORAL | Status: DC
  Administered 2020-04-28 – 2020-04-30 (×3): 20 mg via ORAL
  Filled 2020-04-28 (×3): qty 1

## 2020-04-28 MED ORDER — LOPERAMIDE HCL 2 MG PO CAPS
2.0000 mg | ORAL_CAPSULE | Freq: Four times a day (QID) | ORAL | Status: DC | PRN
Start: 2020-04-28 — End: 2020-04-30

## 2020-04-28 MED ORDER — PYRIDOSTIGMINE BROMIDE 60 MG PO TABS
60.0000 mg | ORAL_TABLET | Freq: Three times a day (TID) | ORAL | Status: DC
  Administered 2020-04-28 – 2020-04-30 (×7): 60 mg via ORAL
  Filled 2020-04-28 (×11): qty 1

## 2020-04-28 MED ORDER — ONDANSETRON HCL 4 MG PO TABS: 4.0000 mg | ORAL_TABLET | Freq: Four times a day (QID) | ORAL | Status: DC | PRN

## 2020-04-28 MED ORDER — TIMOLOL MALEATE 0.25 % OP SOLN
1.0000 [drp] | Freq: Two times a day (BID) | OPHTHALMIC | Status: DC
  Administered 2020-04-28 – 2020-04-30 (×4): 1 [drp] via OPHTHALMIC
  Filled 2020-04-28 (×2): qty 5

## 2020-04-28 MED ORDER — ALBUTEROL SULFATE HFA 108 (90 BASE) MCG/ACT IN AERS
1.0000 | INHALATION_SPRAY | Freq: Four times a day (QID) | RESPIRATORY_TRACT | Status: DC | PRN
  Filled 2020-04-28: qty 6.7

## 2020-04-28 MED ORDER — TRAMADOL HCL 50 MG PO TABS
50.0000 mg | ORAL_TABLET | Freq: Four times a day (QID) | ORAL | Status: DC | PRN
  Administered 2020-04-29 (×2): 50 mg via ORAL
  Filled 2020-04-28 (×2): qty 1

## 2020-04-28 MED ORDER — LEVOTHYROXINE SODIUM 88 MCG PO TABS
88.0000 ug | ORAL_TABLET | Freq: Every day | ORAL | Status: DC
  Administered 2020-04-28 – 2020-04-30 (×3): 88 ug via ORAL
  Filled 2020-04-28 (×5): qty 1

## 2020-04-28 MED ORDER — TRAZODONE HCL 50 MG PO TABS: 25.0000 mg | ORAL_TABLET | Freq: Every evening | ORAL | Status: DC | PRN

## 2020-04-28 MED ORDER — FLEET ENEMA 7-19 GM/118ML RE ENEM: 1.0000 | ENEMA | Freq: Every day | RECTAL | Status: DC | PRN

## 2020-04-28 MED ORDER — ENOXAPARIN SODIUM 40 MG/0.4ML ~~LOC~~ SOLN
40.0000 mg | SUBCUTANEOUS | Status: DC
  Administered 2020-04-28 – 2020-04-30 (×3): 40 mg via SUBCUTANEOUS
  Filled 2020-04-28 (×3): qty 0.4

## 2020-04-28 MED ORDER — MECLIZINE HCL 12.5 MG PO TABS
12.5000 mg | ORAL_TABLET | Freq: Three times a day (TID) | ORAL | Status: DC
  Administered 2020-04-28 – 2020-04-29 (×4): 12.5 mg via ORAL
  Filled 2020-04-28 (×7): qty 1

## 2020-04-28 MED ORDER — DOCUSATE SODIUM 100 MG PO CAPS: 100.0000 mg | ORAL_CAPSULE | Freq: Two times a day (BID) | ORAL | Status: DC | PRN

## 2020-04-28 MED ORDER — OXYCODONE HCL ER 10 MG PO T12A
10.0000 mg | EXTENDED_RELEASE_TABLET | Freq: Two times a day (BID) | ORAL | Status: DC
  Administered 2020-04-28 – 2020-04-30 (×4): 10 mg via ORAL
  Filled 2020-04-28 (×4): qty 1

## 2020-04-28 MED ORDER — SODIUM CHLORIDE 0.9 % IV SOLN: INTRAVENOUS | Status: DC

## 2020-04-28 MED ORDER — MECLIZINE HCL 25 MG PO TABS: 12.5000 mg | ORAL_TABLET | Freq: Three times a day (TID) | ORAL | Status: DC | PRN

## 2020-04-28 MED ORDER — ACETAMINOPHEN 650 MG RE SUPP: 650.0000 mg | Freq: Four times a day (QID) | RECTAL | Status: DC | PRN

## 2020-04-28 MED ORDER — PANTOPRAZOLE SODIUM 40 MG PO TBEC
40.0000 mg | DELAYED_RELEASE_TABLET | Freq: Every day | ORAL | Status: DC
  Administered 2020-04-28 – 2020-04-30 (×3): 40 mg via ORAL
  Filled 2020-04-28 (×3): qty 1

## 2020-04-28 MED ORDER — CALCIUM CARBONATE-VITAMIN D 500-200 MG-UNIT PO TABS
1.0000 | ORAL_TABLET | Freq: Every day | ORAL | Status: DC
  Administered 2020-04-28 – 2020-04-30 (×3): 1 via ORAL
  Filled 2020-04-28 (×3): qty 1

## 2020-04-28 MED ORDER — MYCOPHENOLATE MOFETIL 250 MG PO CAPS
500.0000 mg | ORAL_CAPSULE | Freq: Two times a day (BID) | ORAL | Status: DC
  Administered 2020-04-28 – 2020-04-29 (×3): 1000 mg via ORAL
  Administered 2020-04-29: 500 mg via ORAL
  Administered 2020-04-30: 1000 mg via ORAL
  Filled 2020-04-28: qty 2
  Filled 2020-04-28 (×6): qty 4

## 2020-04-28 MED ORDER — BRIMONIDINE TARTRATE 0.2 % OP SOLN
1.0000 [drp] | Freq: Two times a day (BID) | OPHTHALMIC | Status: DC
  Administered 2020-04-28 – 2020-04-30 (×4): 1 [drp] via OPHTHALMIC
  Filled 2020-04-28 (×3): qty 5

## 2020-04-28 MED ORDER — OXYCODONE HCL 5 MG PO TABS: 5.0000 mg | ORAL_TABLET | ORAL | Status: DC | PRN

## 2020-04-28 MED ORDER — ONDANSETRON HCL 4 MG/2ML IJ SOLN: 4.0000 mg | Freq: Four times a day (QID) | INTRAMUSCULAR | Status: DC | PRN

## 2020-04-28 NOTE — ED Notes (Signed)
Attempted to ambulate patient with walker and 2 staff assist. Pt c/o dizziness and "room spinning." Pt too unsteady standing at bedside to ambulate. Dr Don Perking notified.

## 2020-04-28 NOTE — ED Notes (Signed)
Pt back in bed.

## 2020-04-28 NOTE — ED Notes (Signed)
Report messaged to [employer]

## 2020-04-28 NOTE — ED Notes (Signed)
Lab called for morning blood collection.

## 2020-04-28 NOTE — ED Notes (Signed)
Pt given meal tray at this time 

## 2020-04-28 NOTE — ED Notes (Signed)
Resumed care from isabella rn.  Pt alert  Family with pt   PT also with pt.  Pt waiting on admission.

## 2020-04-28 NOTE — Care Plan (Addendum)
Pt to unit at this time via stretcher, pt moved to hospital bed. IV in left AC leaking at site, red and bruising noted. IV catheter sticking out 3cm and kinked. IV removed. Caregiver states she brought this up to ED staff and it was never addressed. IV team consult placed at this time. Pt has noted bruising to bilateral arms, swelling to lower extremities +3 to +4. Socks removed as they were indenting patients skin. Gown filthy with blood stains and changed at this time. Sacrum dressing placed to sacrum. Pt states he wears dentures but does not have them with him. Granddaughter at bedside. Last BM today [phone]. Pt states he knows when he needs to use the restroom. Pt states he knows when he has to use the restroom. Pt lengthy educated about getting up without assistance and the consequences that could happen such as broken hip,knee, leg, sacrum fracture and mortality rate of long bone fractures as pt granddaughter states he does not listen and will try to get up without help if the bathroom calls. Bed alarm is on at this time. Dinner cut up for patient and patient takes meds one at a time with water. Will continue to monitor closely.

## 2020-04-28 NOTE — ED Provider Notes (Signed)
Accepted care of this from Dr. Michiel Sites.   Patient interviewed and evaluated by me. Granddaughter at bedside. Patient with history of vertigo. Had fall 8 days ago with hip pain and negative CT pelvis. Put on 10mg  of oxycodone. Has not had  BM since. Now with constant room spinning sensation. Labs with no acute abnormalities. MRI of the brain was pending. That was negative for acute stroke. Patient given meclizine and IVF and continues to be very vertiginous and unable to ambulate or standing without significant assistance therefore will consult hospitalist for admission.    I have personally reviewed the images performed during this visit and I agree with the Radiologist's read.   Interpretation by Radiologist:  DG Chest 1 View  Result Date: 04/27/2020 CLINICAL DATA:  Dizziness, vomiting EXAM: CHEST  1 VIEW COMPARISON:  None. FINDINGS: Single frontal view of the chest demonstrates an unremarkable cardiac silhouette. No acute airspace disease, effusion, or pneumothorax. No acute bony abnormalities. IMPRESSION: 1. No acute intrathoracic process. Electronically Signed   By: 06/27/2020 M.D.   On: 04/27/2020 21:48   CT Head Wo Contrast  Result Date: 04/27/2020 CLINICAL DATA:  Headache dizzy EXAM: CT HEAD WITHOUT CONTRAST TECHNIQUE: Contiguous axial images were obtained from the base of the skull through the vertex without intravenous contrast. COMPARISON:  MRI 04/12/2009 FINDINGS: Brain: No acute territorial infarction, hemorrhage or intracranial mass. Moderate atrophy. Advanced white matter disease likely chronic small vessel ischemic change. Mildly prominent ventricles felt secondary to atrophy Vascular: No hyperdense vessels. Vertebral and carotid vascular calcification Skull: Normal. Negative for fracture or focal lesion. Sinuses/Orbits: No acute finding. Other: None IMPRESSION: 1. No CT evidence for acute intracranial abnormality. 2. Atrophy and small vessel ischemic changes of the white matter.  Electronically Signed   By: 04/14/2009 M.D.   On: 04/27/2020 22:25   MR BRAIN WO CONTRAST  Result Date: 04/28/2020 CLINICAL DATA:  Initial evaluation for acute dizziness. EXAM: MRI HEAD WITHOUT CONTRAST TECHNIQUE: Multiplanar, multiecho pulse sequences of the brain and surrounding structures were obtained without intravenous contrast. COMPARISON:  Prior CT from earlier the same day. FINDINGS: Brain: Examination degraded by motion artifact. Generalized age-related cerebral atrophy. Patchy and confluent T2/FLAIR hyperintensity within the periventricular deep white matter both cerebral hemispheres most consistent with chronic small vessel ischemic disease, moderate in nature. No abnormal foci of restricted diffusion to suggest acute or subacute ischemia. Gray-white matter differentiation maintained. No encephalomalacia to suggest chronic cortical infarction. No acute intracranial hemorrhage. Single punctate focus of susceptibility artifact noted at the left occipital lobe, consistent with a small chronic microhemorrhage, of doubtful significance in isolation. No mass lesion, midline shift or mass effect. Diffuse ventricular prominence related to global parenchymal volume loss without hydrocephalus. No extra-axial fluid collection. Pituitary gland suprasellar region within normal limits. Midline structures intact. Vascular: Major intracranial vascular flow voids are maintained. Skull and upper cervical spine: Craniocervical junction within normal limits. Bone marrow signal intensity normal. No scalp soft tissue abnormality. Sinuses/Orbits: Patient status post bilateral ocular lens replacement. Globes and orbital soft tissues demonstrate no acute finding. Paranasal sinuses are largely clear. No significant mastoid effusion. Other: None. IMPRESSION: 1. No acute intracranial abnormality. 2. Generalized age-related cerebral atrophy with moderate chronic microvascular ischemic disease. Electronically Signed   By:  06/28/2020 M.D.   On: 04/28/2020 00:39      06/28/2020, MD 04/28/20 [phone]

## 2020-04-28 NOTE — Progress Notes (Signed)
Patient ID: Gregory Small, male   DOB: May 31, 1919, 87 y.o.   MRN: [id number] This is a no charge note as patient was admitted this AM.  Patient seen and examined and H&P reviewed.  Daughter at bedside. Gregory Small is a 9 y.o. Caucasian male with medical history significant for hypertension and hypothyroidism who had a fall a couple weeks ago with subsequent left hip pain without fracture for which she has been on opiate narcotic therapy for pain at a skilled nursing facility.  Today his granddaughter was called as he was having dizziness and vomiting with associated vertigo.  Granddaughter states has a history of vertigo.  Was given meclizine as needed yesterday felt better but again dizzy today. VSS Alert oriented x3 Clear to auscultation Regular S1-S2 Soft benign  A/p-Vertigo- still symptomatic , getting meclizine prn, will change to tid dosing. No further n/v Will decrease ivf If improves possible dc in am.

## 2020-04-28 NOTE — ED Notes (Signed)
[place] updated at this time

## 2020-04-28 NOTE — H&P (Signed)
Gregory Small   PATIENT NAME: Gregory Small    MR#:  [id number]  DATE OF BIRTH:  09/02/1919  DATE OF ADMISSION:  04/27/2020  PRIMARY CARE PHYSICIAN: [facility]   Patient is coming from: Home REQUESTING/REFERRING PHYSICIAN: Nita Sickle, MD  CHIEF COMPLAINT:   Chief Complaint  Patient presents with  . Dizziness  . Vomiting  . Hip Pain    HISTORY OF PRESENT ILLNESS:  Gregory Small is a 87 y.o. Caucasian male with medical history significant for hypertension and hypothyroidism who had a fall a couple weeks ago with subsequent left hip pain without fracture for which she has been on opiate narcotic therapy for pain at a skilled nursing facility.  Today his granddaughter was called as he was having dizziness and vomiting with associated vertigo.  He was diagnosed with myasthenia gravis in July.  He has not had any bowel movement since Saturday.  He denies abdominal pain.  No fever or chills.  No dysuria, oliguria or hematuria or flank pain.  No cough or wheezing or dyspnea. ED Course: When he came to the ER blood pressure was 148/70 with otherwise normal vital signs.  Labs revealed negative COVID-19 PCR and influenza antigens and unremarkable UA except for 30 protein.  CBC showed minimal anemia and CMP showed a BUN of 24 creatinine of 1.1 and albumin of 3.4. EKG as reviewed by me :Showed atrial fibrillation with controlled ventricular sponsor of 70 Imaging: Chest x-ray showed no acute cardiopulmonary disease.  Noncontrasted head CT scan revealed no acute intracranial normalities.  Brain MRI showed cerebral atrophy and moderate chronic microvascular ischemic disease that is age-appropriate with no acute intracranial normality.  The patient was given 50 mcg of IV fentanyl, 500 mill of IV lactated Ringer, 0.5 mg of IV Ativan, 25 mg of p.o. Antivert and 4 mg IV Zofran.  He will be admitted to an observation medical monitored bed for further evaluation and  management. PAST MEDICAL HISTORY:   Past Medical History:  Diagnosis Date  . Hypertension   . Hypothyroidism     PAST SURGICAL HISTORY:   Past Surgical History:  Procedure Laterality Date  . BACK SURGERY      SOCIAL HISTORY:   Social History   Tobacco Use  . Smoking status: Never Smoker  . Smokeless tobacco: Not on file  Substance Use Topics  . Alcohol use: Not Currently    FAMILY HISTORY:   Family History  Family history unknown: Yes    DRUG ALLERGIES:   Allergies  Allergen Reactions  . Simvastatin Other (See Comments)    Myalgias   . Sulfa Antibiotics     REVIEW OF SYSTEMS:   ROS As per history of present illness. All pertinent systems were reviewed above. Constitutional, HEENT, cardiovascular, respiratory, GI, GU, musculoskeletal, neuro, psychiatric, endocrine, integumentary and hematologic systems were reviewed and are otherwise negative/unremarkable except for positive findings mentioned above in the HPI.   MEDICATIONS AT HOME:   Prior to Admission medications   Medication Sig Start Date End Date Taking? Authorizing Provider  acetaminophen (TYLENOL) 500 MG tablet Take 500-1,000 mg by mouth every 8 (eight) hours as needed for pain. 12/24/19  Yes [provider]  albuterol (VENTOLIN HFA) 108 (90 Base) MCG/ACT inhaler Inhale 1-2 puffs into the lungs every 6 (six) hours as needed for wheezing or shortness of breath. 04/15/20  Yes [provider]  brimonidine (ALPHAGAN) 0.2 % ophthalmic solution Place 1 drop into both eyes 2 (  two) times daily. 04/12/20  Yes [provider]  Calcium Carbonate-Vit D-Min (CALTRATE 600+D PLUS MINERALS) 600-800 MG-UNIT TABS Take 1 tablet by mouth daily.   Yes [provider]  levothyroxine (SYNTHROID) 88 MCG tablet Take 88 mcg by mouth daily before breakfast. 11/12/19  Yes [provider]  loperamide (IMODIUM A-D) 2 MG tablet Take 2-4 mg by mouth 4 (four) times daily as needed for diarrhea  or loose stools. TAKE TWO TABLETS BY MOUTH AFTER 1ST LOOSE STOOL THEN 1 TABLET BY MOUTH AFTER EACH SUBSEQUENT LOOSE STOOL. ( DO NOT EXCEED 4 TABLETS IN 24 HOURS) **IF DIARRHEA PERSISTS LONGER THAN 24 HOURS CALL M.D.   Yes [provider]  mycophenolate (CELLCEPT) 500 MG tablet Take 500-1,000 mg by mouth 2 (two) times daily. 04/14/20  Yes [provider]  oxyCODONE (OXY IR/ROXICODONE) 5 MG immediate release tablet Take 1 tablet (5 mg total) by mouth every 4 (four) hours as needed for breakthrough pain. 04/22/20  Yes Lonia Blood, MD  oxyCODONE (OXYCONTIN) 10 mg 12 hr tablet Take 1 tablet (10 mg total) by mouth every 12 (twelve) hours. 04/22/20  Yes Lonia Blood, MD  pantoprazole (PROTONIX) 40 MG tablet Take 40 mg by mouth daily. 04/14/20  Yes [provider]  predniSONE (DELTASONE) 20 MG tablet Take 20 mg by mouth daily. 04/14/20  Yes [provider]  pyridostigmine (MESTINON) 60 MG tablet Take 60 mg by mouth 3 (three) times daily. 04/14/20  Yes [provider]  timolol (TIMOPTIC) 0.25 % ophthalmic solution Apply 1 drop to eye 2 (two) times daily. 10/22/19  Yes [provider]  traMADol (ULTRAM) 50 MG tablet Take 1 tablet (50 mg total) by mouth every 6 (six) hours as needed for severe pain. 04/22/20  Yes Lonia Blood, MD      VITAL SIGNS:  Blood pressure (!) 162/85, pulse 80, temperature 98.1 F (36.7 C), temperature source Oral, resp. rate 20, height 5\' 7"  (1.702 m), weight 79.4 kg, SpO2 98 %.  PHYSICAL EXAMINATION:  Physical Exam  GENERAL:  87 y.o.-year-old Caucasian patient lying in the bed with no acute distress.  EYES: Pupils equal, round, reactive to light and accommodation. No scleral icterus. Extraocular muscles intact.  HEENT: Head atraumatic, normocephalic. Oropharynx and nasopharynx clear.  NECK:  Supple, no jugular venous distention. No thyroid enlargement, no tenderness.  LUNGS: Normal breath sounds bilaterally, no  wheezing, rales,rhonchi or crepitation. No use of accessory muscles of respiration.  CARDIOVASCULAR: Regular rate and rhythm, S1, S2 normal. No murmurs, rubs, or gallops.  ABDOMEN: Soft, nondistended, nontender. Bowel sounds present. No organomegaly or mass.  EXTREMITIES: No pedal edema, cyanosis, or clubbing.  NEUROLOGIC: Cranial nerves II through XII are intact. Muscle strength 5/5 in all extremities. Sensation intact. Gait not checked.  PSYCHIATRIC: The patient is alert and oriented x 3.  Normal affect and good eye contact. SKIN: No obvious rash, lesion, or ulcer.   LABORATORY PANEL:   CBC Recent Labs  Lab 04/27/20 2131  WBC 10.5  HGB 12.2*  HCT 39.2  PLT 227   ------------------------------------------------------------------------------------------------------------------  Chemistries  Recent Labs  Lab 04/21/20 0440 04/22/20 0434 04/27/20 2131  NA 136   < > 139  K 4.5   < > 3.6  CL 97*   < > 106  CO2 31   < > 24  GLUCOSE 89   < > 152*  BUN 29*   < > 24*  CREATININE 1.49*   < > 1.17  CALCIUM  8.9   < > 8.1*  MG 2.2  --   --   AST 21  --  37  ALT 14  --  35  ALKPHOS 55  --  52  BILITOT 1.1  --  0.7   < > = values in this interval not displayed.   ------------------------------------------------------------------------------------------------------------------  Cardiac Enzymes No results for input(s): TROPONINI in the last 168 hours. ------------------------------------------------------------------------------------------------------------------  RADIOLOGY:  DG Chest 1 View  Result Date: 04/27/2020 CLINICAL DATA:  Dizziness, vomiting EXAM: CHEST  1 VIEW COMPARISON:  None. FINDINGS: Single frontal view of the chest demonstrates an unremarkable cardiac silhouette. No acute airspace disease, effusion, or pneumothorax. No acute bony abnormalities. IMPRESSION: 1. No acute intrathoracic process. Electronically Signed   By: Sharlet SalinaMichael  Brown M.D.   On: 04/27/2020 21:48    CT Head Wo Contrast  Result Date: 04/27/2020 CLINICAL DATA:  Headache dizzy EXAM: CT HEAD WITHOUT CONTRAST TECHNIQUE: Contiguous axial images were obtained from the base of the skull through the vertex without intravenous contrast. COMPARISON:  MRI 04/12/2009 FINDINGS: Brain: No acute territorial infarction, hemorrhage or intracranial mass. Moderate atrophy. Advanced white matter disease likely chronic small vessel ischemic change. Mildly prominent ventricles felt secondary to atrophy Vascular: No hyperdense vessels. Vertebral and carotid vascular calcification Skull: Normal. Negative for fracture or focal lesion. Sinuses/Orbits: No acute finding. Other: None IMPRESSION: 1. No CT evidence for acute intracranial abnormality. 2. Atrophy and small vessel ischemic changes of the white matter. Electronically Signed   By: Jasmine PangKim  Fujinaga M.D.   On: 04/27/2020 22:25   MR BRAIN WO CONTRAST  Result Date: 04/28/2020 CLINICAL DATA:  Initial evaluation for acute dizziness. EXAM: MRI HEAD WITHOUT CONTRAST TECHNIQUE: Multiplanar, multiecho pulse sequences of the brain and surrounding structures were obtained without intravenous contrast. COMPARISON:  Prior CT from earlier the same day. FINDINGS: Brain: Examination degraded by motion artifact. Generalized age-related cerebral atrophy. Patchy and confluent T2/FLAIR hyperintensity within the periventricular deep white matter both cerebral hemispheres most consistent with chronic small vessel ischemic disease, moderate in nature. No abnormal foci of restricted diffusion to suggest acute or subacute ischemia. Gray-white matter differentiation maintained. No encephalomalacia to suggest chronic cortical infarction. No acute intracranial hemorrhage. Single punctate focus of susceptibility artifact noted at the left occipital lobe, consistent with a small chronic microhemorrhage, of doubtful significance in isolation. No mass lesion, midline shift or mass effect. Diffuse ventricular  prominence related to global parenchymal volume loss without hydrocephalus. No extra-axial fluid collection. Pituitary gland suprasellar region within normal limits. Midline structures intact. Vascular: Major intracranial vascular flow voids are maintained. Skull and upper cervical spine: Craniocervical junction within normal limits. Bone marrow signal intensity normal. No scalp soft tissue abnormality. Sinuses/Orbits: Patient status post bilateral ocular lens replacement. Globes and orbital soft tissues demonstrate no acute finding. Paranasal sinuses are largely clear. No significant mastoid effusion. Other: None. IMPRESSION: 1. No acute intracranial abnormality. 2. Generalized age-related cerebral atrophy with moderate chronic microvascular ischemic disease. Electronically Signed   By: Rise MuBenjamin  McClintock M.D.   On: 04/28/2020 00:39      IMPRESSION AND PLAN:  Active Problems:   Vertigo 1.  Benign positional vertigo.  This could be related to tender ear etiology is in vestibular or cochlear etiology. -The patient will be admitted to an observation medical monitored bed. -He had a brain MRI that ruled out vertebrobasilar insufficiency or cerebellar CVA. -The patient will be placed on 12.5 mg of p.o. Antivert 3 times daily as needed. -Physical therapy consult to  be obtained for vestibular rehabilitation.  2.  Hypothyroidism with chronic constipation. -We will continue Synthroid and check TSH. -Stool softener as needed Fleet enema will be provided.  3.  GERD. -We will continue PPI therapy.  4.  Glaucoma. -We will continue his ophthalmic gtt.  5.  Myasthenia gravis. -We will continue Mestinon.  6.  Recent fall and left hip pain. -We will continue pain management while monitoring for sedation.  DVT prophylaxis: Lovenox. Code Status: The patient is DNR/DNI. Family Communication:  The plan of care was discussed in details with the patient (and family). I answered all questions. The  patient agreed to proceed with the above mentioned plan. Further management will depend upon hospital course. Disposition Plan: Back to previous home environment Consults called: none. All the records are reviewed and case discussed with ED provider.  Status is: Observation  The patient remains OBS appropriate and will d/c before 2 midnights.  Dispo: The patient is from: SNF              Anticipated d/c is to: SNF              Patient currently is not medically stable to d/c.   Difficult to place patient No   TOTAL TIME TAKING CARE OF THIS PATIENT: 55 minutes.    Hannah Beat M.D on 04/28/2020 at 3:54 AM  [facility]   From 7 PM-7 AM, contact night-coverage [email or web address]  CC: Primary care physician; [facility]

## 2020-04-28 NOTE — ED Notes (Signed)
In and out catherization was performed and successful at this time

## 2020-04-28 NOTE — Evaluation (Signed)
Physical Therapy Evaluation Patient Details Name: Gregory Small MRN: [id number] DOB: June 23, 1919 Today's Date: 04/28/2020   History of Present Illness  Wing "Gregory" Small is a 89yoM who [facility] on[facility] after acute onset vertigo and vomitting at his facility. Pt's memory precludes great detail at evaluation. PMH: HTN, hypotention, hypoTSH, recent fall c left hip pain. Brain scans reveal no acute abnormality. Pt was here last week after fall.  Clinical Impression  Pt admitted with above diagnosis. Pt currently with functional limitations due to the deficits listed below (see "PT Problem List"). Upon entry, pt in bed, awake and agreeable to participate. Pt's GDTR here as well. PLOF taken from granddaughter. The pt is alert, pleasant, interactive, and but struggles with clear report of recent events. Pt's mentation notably altered compared to both PT sessions here last week, slight confusion. Pt has not dizziness in session (now on meclizine) except for when he return to reclined position from sitting EOB. Pt has normal eye tracking exam, smooth pursuits appropriate. Pupils appear ~39mm bilat, unclear if typical for this patient. Min-modA for bed mobility, minA to come to standing, min-modA of constant body contact to prevent fall to floor as patient has multidirectional, fluctuating leaning that he is somewhat aware of but unable to correct over 3 minutes. BP stable during exam, taken multiple times. Last week pt able to AMB >246ft on 2 different days with RW, no gross LOB or concerning unsteadiness, today, unable to obtain standing balance at all.  Patient's performance this date reveals decreased ability, independence, and tolerance in performing all basic mobility required for performance of activities of daily living. Pt requires additional DME, close physical assistance, and cues for safe participate in mobility. Pt will benefit from skilled PT intervention to increase independence and safety  with basic mobility in preparation for discharge to the venue listed below.       Follow Up Recommendations SNF;Supervision for mobility/OOB;Supervision - Intermittent    Equipment Recommendations   (can use his 4WW from home, will stll need 1:1 assist for balance)    Recommendations for Other Services       Precautions / Restrictions Precautions Precautions: Fall Restrictions Weight Bearing Restrictions: No      Mobility  Bed Mobility Overal bed mobility: Needs Assistance Bed Mobility: Supine to Sit;Sit to Supine     Supine to sit: Min assist Sit to supine: Mod assist   General bed mobility comments: Unable to lift left leg into bed still since fall last week    Transfers Overall transfer level: Needs assistance Equipment used: 1 person hand held assist Transfers: Sit to/from Stand           General transfer comment: maintained standing for 2-3 minuts with authors hand; requires almost constant min-modA of trunk to maintain balance, alternate posterior and lateral leans. Confusion about activity despite verbal cues multiple times.  Ambulation/Gait                Stairs            Wheelchair Mobility    Modified Rankin (Stroke Patients Only)       Balance Overall balance assessment: Needs assistance Sitting-balance support: Single extremity supported Sitting balance-Leahy Scale: Fair     Standing balance support: Single extremity supported;During functional activity Standing balance-Leahy Scale: Zero Standing balance comment: unable to obtain upright balance despite seevral minutes attempt  Pertinent Vitals/Pain Pain Assessment: 0-10 Faces Pain Scale: Hurts even more Pain Location: Reports back pain with prolonged standing (of note: left anterior thigh muscle cramping during PT last week) Pain Descriptors / Indicators: Aching Pain Intervention(s): Limited activity within patient's  tolerance;Monitored during session    Home Living Family/patient expects to be discharged to:: Assisted living (recently moved to [place] from [place] back in November.)               Home Equipment: Dan Humphreys - 4 wheels;Shower seat      Prior Function Level of Independence: Independent with assistive device(s)   Gait / Transfers Assistance Needed: MOD I with rollator, but endorses Vertigo and h/o falling. At least 2 in last 6 months. Unclear whether dizziness contributed to fall.  ADL's / Homemaking Assistance Needed: Facility provides meals/med mgt, pt able to perform ADLs I'ly/MOD I (shower chair)        Hand Dominance        Extremity/Trunk Assessment   Upper Extremity Assessment Upper Extremity Assessment: Generalized weakness    Lower Extremity Assessment Lower Extremity Assessment: Generalized weakness       Communication      Cognition Arousal/Alertness: Awake/alert Behavior During Therapy: WFL for tasks assessed/performed Overall Cognitive Status: Within Functional Limits for tasks assessed                                 General Comments: Mentation much better last week x2, typically better. Today mildly delayed at times, more difficutly with memory recall, word error in questioning.      General Comments      Exercises     Assessment/Plan    PT Assessment Patient needs continued PT services  PT Problem List Decreased strength;Decreased range of motion;Decreased activity tolerance;Decreased balance;Decreased mobility;Decreased safety awareness;Decreased cognition       PT Treatment Interventions DME instruction;Gait training;Stair training;Functional mobility training;Therapeutic activities;Therapeutic exercise;Balance training;Neuromuscular re-education;Patient/family education    PT Goals (Current goals can be found in the Care Plan section)  Acute Rehab PT Goals Patient Stated Goal: regain strength, stop c nausea PT Goal  Formulation: With patient/family Time For Goal Achievement: 05/12/20 Potential to Achieve Goals: Fair    Frequency Min 2X/week   Barriers to discharge Decreased caregiver support ALF unable to provide level of care needed to maintain safety    Co-evaluation               AM-PAC PT "6 Clicks" Mobility  Outcome Measure Help needed turning from your back to your side while in a flat bed without using bedrails?: A Little Help needed moving from lying on your back to sitting on the side of a flat bed without using bedrails?: A Lot Help needed moving to and from a bed to a chair (including a wheelchair)?: A Lot Help needed standing up from a chair using your arms (e.g., wheelchair or bedside chair)?: A Lot Help needed to walk in hospital room?: A Lot Help needed climbing 3-5 steps with a railing? : A Lot 6 Click Score: 13    End of Session Equipment Utilized During Treatment: Gait belt Activity Tolerance: No increased pain;Patient limited by fatigue Patient left: in bed;with call bell/phone within reach;with family/visitor present Nurse Communication: Mobility status PT Visit Diagnosis: Unsteadiness on feet (R26.81);Other abnormalities of gait and mobility (R26.89);History of falling (Z91.81);Difficulty in walking, not elsewhere classified (R26.2);Dizziness and giddiness (R42)    Time: 0350-0938 PT  Time Calculation (min) (ACUTE ONLY): 35 min   Charges:   PT Evaluation $PT Eval Moderate Complexity: 1 Mod          4:05 PM, 04/28/20 Rosamaria Lints, PT, DPT Physical Therapist - [facility]  [phone] (ASCOM)   Maurizio Geno C 04/28/2020, 3:52 PM

## 2020-04-28 NOTE — ED Notes (Signed)
This nurse and ED tech helped pt walk to toilet in room with walker. Pt stating urgent need to have BM. Pt sitting on toilet now, nurse and tech in room with pt.  Bed linens changed. Hospital gown and non-slip socks applied.

## 2020-04-29 DIAGNOSIS — Z882 Allergy status to sulfonamides status: Secondary | ICD-10-CM | POA: Diagnosis not present

## 2020-04-29 DIAGNOSIS — Z888 Allergy status to other drugs, medicaments and biological substances status: Secondary | ICD-10-CM | POA: Diagnosis not present

## 2020-04-29 DIAGNOSIS — E039 Hypothyroidism, unspecified: Secondary | ICD-10-CM | POA: Diagnosis present

## 2020-04-29 DIAGNOSIS — Z79899 Other long term (current) drug therapy: Secondary | ICD-10-CM | POA: Diagnosis not present

## 2020-04-29 DIAGNOSIS — M25552 Pain in left hip: Secondary | ICD-10-CM | POA: Diagnosis present

## 2020-04-29 DIAGNOSIS — Z66 Do not resuscitate: Secondary | ICD-10-CM | POA: Diagnosis present

## 2020-04-29 DIAGNOSIS — I482 Chronic atrial fibrillation, unspecified: Secondary | ICD-10-CM | POA: Diagnosis present

## 2020-04-29 DIAGNOSIS — Z9181 History of falling: Secondary | ICD-10-CM | POA: Diagnosis not present

## 2020-04-29 DIAGNOSIS — Z20822 Contact with and (suspected) exposure to covid-19: Secondary | ICD-10-CM | POA: Diagnosis present

## 2020-04-29 DIAGNOSIS — H8110 Benign paroxysmal vertigo, unspecified ear: Secondary | ICD-10-CM | POA: Diagnosis present

## 2020-04-29 DIAGNOSIS — Z7952 Long term (current) use of systemic steroids: Secondary | ICD-10-CM | POA: Diagnosis not present

## 2020-04-29 DIAGNOSIS — W19XXXS Unspecified fall, sequela: Secondary | ICD-10-CM | POA: Diagnosis not present

## 2020-04-29 DIAGNOSIS — H409 Unspecified glaucoma: Secondary | ICD-10-CM | POA: Diagnosis present

## 2020-04-29 DIAGNOSIS — K219 Gastro-esophageal reflux disease without esophagitis: Secondary | ICD-10-CM | POA: Diagnosis present

## 2020-04-29 DIAGNOSIS — Z79890 Hormone replacement therapy (postmenopausal): Secondary | ICD-10-CM | POA: Diagnosis not present

## 2020-04-29 DIAGNOSIS — I10 Essential (primary) hypertension: Secondary | ICD-10-CM | POA: Diagnosis present

## 2020-04-29 DIAGNOSIS — R42 Dizziness and giddiness: Secondary | ICD-10-CM | POA: Diagnosis present

## 2020-04-29 DIAGNOSIS — G7000 Myasthenia gravis without (acute) exacerbation: Secondary | ICD-10-CM | POA: Diagnosis present

## 2020-04-29 DIAGNOSIS — R112 Nausea with vomiting, unspecified: Secondary | ICD-10-CM | POA: Diagnosis present

## 2020-04-29 DIAGNOSIS — K5909 Other constipation: Secondary | ICD-10-CM | POA: Diagnosis present

## 2020-04-29 MED ORDER — MECLIZINE HCL 12.5 MG PO TABS
12.5000 mg | ORAL_TABLET | Freq: Once | ORAL | Status: AC
  Administered 2020-04-29: 12.5 mg via ORAL
  Filled 2020-04-29: qty 1

## 2020-04-29 MED ORDER — MECLIZINE HCL 25 MG PO TABS
25.0000 mg | ORAL_TABLET | Freq: Three times a day (TID) | ORAL | Status: DC
  Administered 2020-04-29 – 2020-04-30 (×3): 25 mg via ORAL
  Filled 2020-04-29 (×5): qty 1

## 2020-04-29 MED ORDER — ACETAMINOPHEN 325 MG PO TABS: 650.0000 mg | ORAL_TABLET | Freq: Four times a day (QID) | ORAL | Status: DC | PRN

## 2020-04-29 MED ORDER — MECLIZINE HCL 25 MG PO TABS: 25.0000 mg | ORAL_TABLET | Freq: Three times a day (TID) | ORAL | 0 refills | Status: AC

## 2020-04-29 MED ORDER — ACETAMINOPHEN 650 MG RE SUPP: 650.0000 mg | Freq: Four times a day (QID) | RECTAL | Status: DC | PRN

## 2020-04-29 NOTE — Progress Notes (Signed)
PROGRESS NOTE    Gregory SECRIST  KYH:[id number] DOB: Nov 29, 1919 DOA: 04/27/2020 PCP: Almetta Lovely, [facility]    Brief Narrative:  Gregory Dimes Schneideris a 87 y.o.Caucasian malewith medical history significant forhypertension and hypothyroidismwho had a fall a couple weeks ago with subsequent left hip pain without fracture for which she has been on opiate narcotic therapy for pain at a skilled nursing facility. Granddaughter was called as he was having dizziness and vomiting with associated vertigo.  Granddaughter states has a history of vertigo.  3/3-was doing better while lying in bed, but became quite dizzy when working with PT this am.     Consultants:     Procedures:   Antimicrobials:       Subjective: No n/v  Objective: Vitals:   04/28/20 2316 04/29/20 0449 04/29/20 0834 04/29/20 1158  BP: 139/71 (!) 161/93 (!) 182/98 113/70  Pulse: 60 79 81 66  Resp: 16 20 20 16   Temp: 97.9 F (36.6 C) (!) 97.4 F (36.3 C) 97.6 F (36.4 C) 97.8 F (36.6 C)  TempSrc: Oral Oral Oral   SpO2: 96% 100% 100% 98%  Weight:      Height:        Intake/Output Summary (Last 24 hours) at 04/29/2020 1248 Last data filed at 04/29/2020 1100 Gross per 24 hour  Intake 1759.24 ml  Output 350 ml  Net 1409.24 ml   Filed Weights   04/27/20 2139 04/28/20 1701  Weight: 79.4 kg 80 kg    Examination:  General exam: Appears calm and comfortable  Respiratory system: Clear to auscultation. Respiratory effort normal. Cardiovascular system: S1 & S2 heard, RRR. No JVD, murmurs, rubs, gallops or clicks. No pedal edema. Gastrointestinal system: Abdomen is nondistended, soft and nontender.  Normal bowel sounds heard. Central nervous system: Alert and oriented. Grossly intact Extremities: no edema Skin: warm, dry Psychiatry: Judgement and insight appear normal. Mood & affect appropriate.     Data Reviewed: I have personally reviewed following labs and imaging studies  CBC: Recent Labs   Lab 04/27/20 2131 04/28/20 0647 04/28/20 1527  WBC 10.5 13.7* 10.7*  NEUTROABS 8.6*  --   --   HGB 12.2* 12.7* 12.0*  HCT 39.2 39.9 39.0  MCV 95.8 95.7 96.8  PLT 227 205 208   Basic Metabolic Panel: Recent Labs  Lab 04/27/20 2131 04/28/20 0647  NA 139 142  K 3.6 4.6  CL 106 106  CO2 24 27  GLUCOSE 152* 93  BUN 24* 22  CREATININE 1.17 1.10  CALCIUM 8.1* 8.7*   GFR: Estimated Creatinine Clearance: 38 mL/min (by C-G formula based on SCr of 1.1 mg/dL). Liver Function Tests: Recent Labs  Lab 04/27/20 2131  AST 37  ALT 35  ALKPHOS 52  BILITOT 0.7  PROT 6.0*  ALBUMIN 3.4*   No results for input(s): LIPASE, AMYLASE in the last 168 hours. No results for input(s): AMMONIA in the last 168 hours. Coagulation Profile: No results for input(s): INR, PROTIME in the last 168 hours. Cardiac Enzymes: No results for input(s): CKTOTAL, CKMB, CKMBINDEX, TROPONINI in the last 168 hours. BNP (last 3 results) No results for input(s): PROBNP in the last 8760 hours. HbA1C: No results for input(s): HGBA1C in the last 72 hours. CBG: No results for input(s): GLUCAP in the last 168 hours. Lipid Profile: No results for input(s): CHOL, HDL, LDLCALC, TRIG, CHOLHDL, LDLDIRECT in the last 72 hours. Thyroid Function Tests: Recent Labs    04/28/20 0647  TSH 0.872   Anemia Panel: No  results for input(s): VITAMINB12, FOLATE, FERRITIN, TIBC, IRON, RETICCTPCT in the last 72 hours. Sepsis Labs: No results for input(s): PROCALCITON, LATICACIDVEN in the last 168 hours.  Recent Results (from the past 240 hour(s))  Resp Panel by RT-PCR (Flu A&B, Covid) Nasopharyngeal Swab     Status: None   Collection Time: 04/27/20  9:48 PM   Specimen: Nasopharyngeal Swab; Nasopharyngeal(NP) swabs in vial transport medium  Result Value Ref Range Status   SARS Coronavirus 2 by RT PCR NEGATIVE NEGATIVE Final    Comment: (NOTE) SARS-CoV-2 target nucleic acids are NOT DETECTED.  The SARS-CoV-2 RNA is  generally detectable in upper respiratory specimens during the acute phase of infection. The lowest concentration of SARS-CoV-2 viral copies this assay can detect is 138 copies/mL. A negative result does not preclude SARS-Cov-2 infection and should not be used as the sole basis for treatment or other patient management decisions. A negative result may occur with  improper specimen collection/handling, submission of specimen other than nasopharyngeal swab, presence of viral mutation(s) within the areas targeted by this assay, and inadequate number of viral copies(<138 copies/mL). A negative result must be combined with clinical observations, patient history, and epidemiological information. The expected result is Negative.  Fact Sheet for Patients:  BloggerCourse.com  Fact Sheet for Healthcare Providers:  SeriousBroker.it  This test is no t yet approved or cleared by the [place] FDA and  has been authorized for detection and/or diagnosis of SARS-CoV-2 by FDA under an Emergency Use Authorization (EUA). This EUA will remain  in effect (meaning this test can be used) for the duration of the COVID-19 declaration under Section 564(b)(1) of the Act, 21 U.S.C.section 360bbb-3(b)(1), unless the authorization is terminated  or revoked sooner.       Influenza A by PCR NEGATIVE NEGATIVE Final   Influenza B by PCR NEGATIVE NEGATIVE Final    Comment: (NOTE) The Xpert Xpress SARS-CoV-2/FLU/RSV plus assay is intended as an aid in the diagnosis of influenza from Nasopharyngeal swab specimens and should not be used as a sole basis for treatment. Nasal washings and aspirates are unacceptable for Xpert Xpress SARS-CoV-2/FLU/RSV testing.  Fact Sheet for Patients: BloggerCourse.com  Fact Sheet for Healthcare Providers: SeriousBroker.it  This test is not yet approved or cleared by the [place] FDA and has been authorized for detection and/or diagnosis of SARS-CoV-2 by FDA under an Emergency Use Authorization (EUA). This EUA will remain in effect (meaning this test can be used) for the duration of the COVID-19 declaration under Section 564(b)(1) of the Act, 21 U.S.C. section 360bbb-3(b)(1), unless the authorization is terminated or revoked.  Performed at [facility], [address], [place], [place] [address]          Radiology Studies: DG Chest 1 View  Result Date: 04/27/2020 CLINICAL DATA:  Dizziness, vomiting EXAM: CHEST  1 VIEW COMPARISON:  None. FINDINGS: Single frontal view of the chest demonstrates an unremarkable cardiac silhouette. No acute airspace disease, effusion, or pneumothorax. No acute bony abnormalities. IMPRESSION: 1. No acute intrathoracic process. Electronically Signed   By: Sharlet Salina M.D.   On: 04/27/2020 21:48   CT Head Wo Contrast  Result Date: 04/27/2020 CLINICAL DATA:  Headache dizzy EXAM: CT HEAD WITHOUT CONTRAST TECHNIQUE: Contiguous axial images were obtained from the base of the skull through the vertex without intravenous contrast. COMPARISON:  MRI 04/12/2009 FINDINGS: Brain: No acute territorial infarction, hemorrhage or intracranial mass. Moderate atrophy. Advanced white matter disease likely chronic small vessel ischemic change. Mildly  prominent ventricles felt secondary to atrophy Vascular: No hyperdense vessels. Vertebral and carotid vascular calcification Skull: Normal. Negative for fracture or focal lesion. Sinuses/Orbits: No acute finding. Other: None IMPRESSION: 1. No CT evidence for acute intracranial abnormality. 2. Atrophy and small vessel ischemic changes of the white matter. Electronically Signed   By: Jasmine Pang M.D.   On: 04/27/2020 22:25   MR BRAIN WO CONTRAST  Result Date: 04/28/2020 CLINICAL DATA:  Initial evaluation for acute dizziness. EXAM: MRI HEAD WITHOUT CONTRAST TECHNIQUE: Multiplanar, multiecho  pulse sequences of the brain and surrounding structures were obtained without intravenous contrast. COMPARISON:  Prior CT from earlier the same day. FINDINGS: Brain: Examination degraded by motion artifact. Generalized age-related cerebral atrophy. Patchy and confluent T2/FLAIR hyperintensity within the periventricular deep white matter both cerebral hemispheres most consistent with chronic small vessel ischemic disease, moderate in nature. No abnormal foci of restricted diffusion to suggest acute or subacute ischemia. Gray-white matter differentiation maintained. No encephalomalacia to suggest chronic cortical infarction. No acute intracranial hemorrhage. Single punctate focus of susceptibility artifact noted at the left occipital lobe, consistent with a small chronic microhemorrhage, of doubtful significance in isolation. No mass lesion, midline shift or mass effect. Diffuse ventricular prominence related to global parenchymal volume loss without hydrocephalus. No extra-axial fluid collection. Pituitary gland suprasellar region within normal limits. Midline structures intact. Vascular: Major intracranial vascular flow voids are maintained. Skull and upper cervical spine: Craniocervical junction within normal limits. Bone marrow signal intensity normal. No scalp soft tissue abnormality. Sinuses/Orbits: Patient status post bilateral ocular lens replacement. Globes and orbital soft tissues demonstrate no acute finding. Paranasal sinuses are largely clear. No significant mastoid effusion. Other: None. IMPRESSION: 1. No acute intracranial abnormality. 2. Generalized age-related cerebral atrophy with moderate chronic microvascular ischemic disease. Electronically Signed   By: Rise Mu M.D.   On: 04/28/2020 00:39        Scheduled Meds: . brimonidine  1 drop Both Eyes BID  . calcium-vitamin D  1 tablet Oral Daily  . enoxaparin (LOVENOX) injection  40 mg Subcutaneous Q24H  . levothyroxine  88 mcg  Oral QAC breakfast  . LORazepam  0.5 mg Intravenous Once  . meclizine  25 mg Oral TID  . mycophenolate  500-1,000 mg Oral BID  . oxyCODONE  10 mg Oral BID  . pantoprazole  40 mg Oral Daily  . predniSONE  20 mg Oral Daily  . pyridostigmine  60 mg Oral TID  . timolol  1 drop Both Eyes BID   Continuous Infusions:  Assessment & Plan:   Active Problems:   Vertigo   Vertigo 1.  Benign positional vertigo.   Has hx/o vertigo Improving slowly, becomes symptomatic with ambulation Will increase meclizine to 25mg  tid MRI braine without acute abn. PT For possible vestibular rehabilitation Ck orthostatics too  D/c'd short acting oxycodone as nsg stated pt became dizzy after this was given?  2.  Hypothyroidism with chronic constipation. -TSH nml Continue synthroid  3.  GERD. Continue PPI   4.  Glaucoma. -continue opthlamic gtt   5.  Myasthenia gravis. -We will continue Mestinon.  6.  Recent fall and left hip pain. -We will continue pain management while monitoring for sedation.   DVT prophylaxis: lovenox Code Status:dnr Family Communication: spoke to daughter via phone Disposition Plan: possible dc in am if asx Status is: Observation  The patient remains OBS appropriate and will d/c before 2 midnights.  Dispo: The patient is from: ALF  Anticipated d/c is to: ALF              Patient currently is not medically stable to d/c.   Difficult to place patient No  Pt still symptomatic with ambulation. Adjusting meds. Pt declined snf.          LOS: 0 days   Time spent: 35 minutes with more than 50% on COC    Lynn ItoSahar Nickalas Mccarrick, MD [facility] Pager [phone]  If 7PM-7AM, please contact night-coverage 04/29/2020, 12:48 PM

## 2020-04-29 NOTE — TOC Initial Note (Signed)
Transition of Care [facility]) - Initial/Assessment Note    Patient Details  Name: Gregory Small MRN: [id number] Date of Birth: April 02, 1919  Transition of [facility]) CM/SW Contact:    Chapman Fitch, RN Phone Number: 04/29/2020, 1:22 PM  Clinical Narrative:                  Patient admitted from [facility] PT has assessed patient and recommends SNF.  Patient wishes to return to [facility] with resumption of home health services.  Patient gives permission for me to reach out to his daughter.   Daughter in agreement with plan.  She is flying back from [facility], and will plan to transport him back to [facility] at discharge  Spoke with Kennyth Arnold at [facility].  She confirms that patient can return.  Requests that nursing notes and fl2 be faxed to [phone]  Fl2 will need to be updated with medications at discharge  [facility] with [facility] notified of admission Expected Discharge Plan: Home w Home Health Services Barriers to Discharge: Continued Medical Work up   Patient Goals and CMS Choice Patient states their goals for this hospitalization and ongoing recovery are:: return to ALF      Expected Discharge Plan and Services Expected Discharge Plan: Home w Home Health Services         Expected Discharge Date: 04/29/20                         HH Arranged: PT,OT HH Agency: [facility] Home Health Care Date [facility] Agency Contacted: 04/29/20   Representative spoke with at [facility] Agency: Kandee Keen  Prior Living Arrangements/Services     Patient language and need for interpreter reviewed:: Yes Do you feel safe going back to the place where you live?: Yes      Need for Family Participation in Patient Care: Yes (Comment) Care giver support system in place?: Yes (comment)   Criminal Activity/Legal Involvement Pertinent to Current Situation/Hospitalization: No - Comment as needed  Activities of Daily Living Home Assistive Devices/Equipment: Walker (specify type) ADL Screening  (condition at time of admission) Patient's cognitive ability adequate to safely complete daily activities?: Yes Is the patient deaf or have difficulty hearing?: Yes Does the patient have difficulty seeing, even when wearing glasses/contacts?: No Does the patient have difficulty concentrating, remembering, or making decisions?: Yes Patient able to express need for assistance with ADLs?: Yes Does the patient have difficulty dressing or bathing?: Yes Independently performs ADLs?: No Communication: Dependent Is this a change from baseline?: Pre-admission baseline Dressing (OT): Dependent Is this a change from baseline?: Pre-admission baseline Grooming: Dependent Is this a change from baseline?: Pre-admission baseline Feeding: Independent Bathing: Dependent Is this a change from baseline?: Pre-admission baseline Toileting: Dependent Is this a change from baseline?: Pre-admission baseline In/Out Bed: Dependent Is this a change from baseline?: Pre-admission baseline Walks in Home: Needs assistance Is this a change from baseline?: Pre-admission baseline Does the patient have difficulty walking or climbing stairs?: Yes Weakness of Legs: Both Weakness of Arms/Hands: None  Permission Sought/Granted                  Emotional Assessment       Orientation: : Oriented to Self,Oriented to Place,Oriented to  Time,Oriented to Situation Alcohol / Substance Use: Not Applicable Psych Involvement: No (comment)  Admission diagnosis:  Vertigo [R42] Left hip pain [M25.552] Nausea and vomiting, intractability of vomiting not specified, unspecified vomiting type [R11.2] Patient Active  Problem List   Diagnosis Date Noted  . Vertigo 04/28/2020  . Acute kidney injury (HCC) 04/21/2020  . Left knee pain 04/20/2020  . Chronic pain syndrome 04/20/2020  . Left hip pain 04/19/2020  . Myasthenia gravis (HCC) 04/19/2020  . Atrial fibrillation, chronic (HCC) 04/19/2020  . Hypothyroidism 04/19/2020    PCP:  [employer] Pharmacy:   [facility] [phone] Dan Humphreys, [place] - [address] [place] [address] Phone: [phone] Fax: [phone]     Social Determinants of Health (SDOH) Interventions    Readmission Risk Interventions No flowsheet data found.

## 2020-04-29 NOTE — Progress Notes (Signed)
Mobility Specialist - Progress Note   04/29/20 1335  Mobility  Activity Stood at bedside  Level of Assistance Moderate assist, patient does 50-74%  Assistive Device  (+2 assist)  Distance Ambulated (ft) 0 ft  Mobility Response Tolerated well  Mobility performed by Mobility specialist  $Mobility charge 1 Mobility    Supine: 148/91 EOB:136/95 Standing: 141/102  Pt was sleeping in bed upon arrival utilizing room air. Family and RN present at bedside. Pt easily awakened by voice. Pt was agreeable to standing for orthostatic VS this date. Pt transferred EOB with minA for LE support. Pt began c/o dizziness once seated. Pt stood at bedside with +2 assist from RN. Pt imbalanced upon standing and c/o moderate dizziness that did progress some. Pt able to hold upright position for measurements, but does require max cueing and assist for posture. Pt does not carry over commands well. Noted pt eyes spinning from activity. Pt sat back EOB with only RN standing in front of him at this time and stated that he was seeing 1.5 people in front of him. Noted mild LOB x1 while seated EOB. Overall, pt tolerated well. Pt returned supine with LE support and was left in bed with all needs in reach and alarm set. Orthostatics recorded above.    Gregory Small Mobility Specialist 04/29/20, 1:46 PM

## 2020-04-29 NOTE — Progress Notes (Signed)
Physical Therapy Treatment Patient Details Name: Gregory Small MRN: [id number] DOB: [birth year] Today's Date: 04/29/2020    History of Present Illness Gregory Small is a 89yoM who [facility] after acute onset vertigo and vomitting at his facility. Pt's memory precludes great detail at evaluation. PMH: HTN, hypotention, hypoTSH, recent fall c left hip pain. Brain scans reveal no acute abnormality. Pt was here last week after fall.    PT Comments    Pt received in Semi-Fowler's position and agreeable to therapy.   Granddaughter in room throughout session.  Pt was able to perform bed-level exercises with mild difficulty on the L LE.  Pt required moderate encouragement to get him to attempt standing and ambulation, with concerns over back and L LE hurting and not being able to place weight through it.  Pt performed bed mobility with good technique utilizing verbal cuing from therapist.  Pt then transferred to standing where pt noted dizziness.  Pt stood for several minutes while waiting for the dizziness symptoms to subside.  Pt then ambulated out into the hallway approximately 20' before stating he needed to use the restroom.  Pt then assisted to the restroom with CGA and use of FWW for support.  Pt required assistance from therapist for toileting.  Pt then ambulated back to hallway another 20' and back to bed.  Pt has posterior lean when going from standing to seated EOB and required modA from therapist to not fall posteriorly in the bed.  Pt performed rest of there ex in bed, and left with call bell and all other needs.  Nursing in room following the conclusion of therapy. Current discharge plans to SNF remain appropriate at this time.  Pt will continue to benefit from skilled therapy in order to address deficits listed below.    Follow Up Recommendations  SNF;Supervision for mobility/OOB;Supervision - Intermittent     Equipment Recommendations   (can use his 4WW from home,  will stll need 1:1 assist for balance)    Recommendations for Other Services       Precautions / Restrictions Precautions Precautions: Fall Restrictions Weight Bearing Restrictions: No    Mobility  Bed Mobility Overal bed mobility: Needs Assistance Bed Mobility: Supine to Sit;Sit to Supine     Supine to sit: Min assist Sit to supine: Min assist   General bed mobility comments: Pt required minA to lift L LE into the bed    Transfers Overall transfer level: Needs assistance Equipment used: 4-wheeled walker Transfers: Sit to/from Stand Sit to Stand: Min guard         General transfer comment: Pt able to maintain standingfor extended time while waiting for dizziness to diminish.  Ambulation/Gait Ambulation/Gait assistance: Min guard Gait Distance (Feet): 80 Feet Assistive device: Rolling walker (2 wheeled) Gait Pattern/deviations: WFL(Within Functional Limits) Gait velocity: decreased   General Gait Details: feels better than anticipated   Stairs             Wheelchair Mobility    Modified Rankin (Stroke Patients Only)       Balance Overall balance assessment: Needs assistance Sitting-balance support: Single extremity supported Sitting balance-Leahy Scale: Fair Sitting balance - Comments: When going from standing to sitting, he experiences a postioer lean. Postural control: Posterior lean Standing balance support: Bilateral upper extremity supported;During functional activity Standing balance-Leahy Scale: Poor Standing balance comment: Requires UE support to maintain balance.  Cognition Arousal/Alertness: Awake/alert Behavior During Therapy: WFL for tasks assessed/performed Overall Cognitive Status: Within Functional Limits for tasks assessed                                        Exercises Total Joint Exercises Ankle Circles/Pumps: AROM;Strengthening;Both;10 reps;Supine Quad Sets:  AROM;Strengthening;Both;10 reps;Supine Gluteal Sets: AROM;Strengthening;Both;10 reps;Supine Hip ABduction/ADduction: AROM;Strengthening;Both;10 reps;Supine Straight Leg Raises: AROM;Strengthening;Both;10 reps;Supine Marching in Standing: AROM;Strengthening;Both;10 reps;Supine    General Comments        Pertinent Vitals/Pain Pain Assessment: Faces Faces Pain Scale: Hurts even more Pain Location: Reports back pain with bed mobility and left leg tenderness. Pain Descriptors / Indicators: Aching Pain Intervention(s): Limited activity within patient's tolerance;Monitored during session;Premedicated before session;Repositioned    Home Living Family/patient expects to be discharged to:: Assisted living (recently moved to [place] from [place] back in November.)             Home Equipment: Dan Humphreys - 4 wheels;Shower seat      Prior Function Level of Independence: Independent with assistive device(s)  Gait / Transfers Assistance Needed: MOD I with rollator, but endorses Vertigo and h/o falling. At least 2 in last 6 months. Unclear whether dizziness contributed to fall. ADL's / Homemaking Assistance Needed: Facility provides meals/med mgt, pt able to perform ADLs I'ly/MOD I (shower chair)     PT Goals (current goals can now be found in the care plan section) Acute Rehab PT Goals Patient Stated Goal: regain strength, stop c nausea PT Goal Formulation: With patient/family Time For Goal Achievement: 05/12/20 Potential to Achieve Goals: Fair Progress towards PT goals: Progressing toward goals    Frequency    Min 2X/week      PT Plan Current plan remains appropriate    Co-evaluation              AM-PAC PT "6 Clicks" Mobility   Outcome Measure  Help needed turning from your back to your side while in a flat bed without using bedrails?: A Little Help needed moving from lying on your back to sitting on the side of a flat bed without using bedrails?: A Lot Help needed  moving to and from a bed to a chair (including a wheelchair)?: A Little Help needed standing up from a chair using your arms (e.g., wheelchair or bedside chair)?: A Little Help needed to walk in hospital room?: A Little Help needed climbing 3-5 steps with a railing? : A Lot 6 Click Score: 16    End of Session Equipment Utilized During Treatment: Gait belt Activity Tolerance: No increased pain;Patient limited by fatigue Patient left: in bed;with call bell/phone within reach;with family/visitor present Nurse Communication: Mobility status PT Visit Diagnosis: Unsteadiness on feet (R26.81);Other abnormalities of gait and mobility (R26.89);History of falling (Z91.81);Difficulty in walking, not elsewhere classified (R26.2);Dizziness and giddiness (R42) Pain - Right/Left: Left Pain - part of body: Leg     Time: 7622-6333 PT Time Calculation (min) (ACUTE ONLY): 43 min  Charges:  $Gait Training: 8-22 mins $Therapeutic Exercise: 8-22 mins $Self Care/Home Management: 8-22                      Nolon Bussing, PT, DPT 04/29/20, 3:03 PM

## 2020-04-29 NOTE — Treatment Plan (Signed)
Pt orthostatic vitals completed at this time. Pt eyes were spinning as he was swaying side to side, helped by mobility tech at this time. Pt states he saw 1.5 of me and the dizziness was there then eased off but remained. MD Amery made aware at this time.

## 2020-04-29 NOTE — [place] FL2 (Addendum)
[place] MEDICAID FL2 LEVEL OF CARE SCREENING TOOL     IDENTIFICATION  Patient Name: Gregory Small Birthdate: 01/21/1920 Sex: male Admission Date (Current Location): 04/27/2020  [place] and [place] Number:  Chiropodist and Address:  [facility], [address], [place], [place] [address]      Provider Number: [id number]  Attending Physician Name and Address:  Lynn Ito, MD  Relative Name and Phone Number:  Revonda Standard - [phone]    Current Level of Care: Hospital Recommended Level of Care: Assisted Living Facility Prior Approval Number:    Date Approved/Denied:   PASRR Number:    Discharge Plan: Other (Comment) (ALF)    Current Diagnoses: Patient Active Problem List   Diagnosis Date Noted  . Vertigo 04/28/2020  . Acute kidney injury (HCC) 04/21/2020  . Left knee pain 04/20/2020  . Chronic pain syndrome 04/20/2020  . Left hip pain 04/19/2020  . Myasthenia gravis (HCC) 04/19/2020  . Atrial fibrillation, chronic (HCC) 04/19/2020  . Hypothyroidism 04/19/2020    Orientation RESPIRATION BLADDER Height & Weight     Self,Situation,Place,Time  Normal Continent Weight: 80 kg Height:  5\' 7"  (170.2 cm)  BEHAVIORAL SYMPTOMS/MOOD NEUROLOGICAL BOWEL NUTRITION STATUS      Continent Diet (Heart Healthy)  AMBULATORY STATUS COMMUNICATION OF NEEDS Skin   Limited Assist Verbally Bruising                       Personal Care Assistance Level of Assistance              Functional Limitations Info             SPECIAL CARE FACTORS FREQUENCY  PT (By licensed PT),OT (By licensed OT)     PT Frequency: [facility] OT Frequency: [facility]            Contractures Contractures Info: Not present    Additional Factors Info  Code Status,Allergies Code Status Info: DNR Allergies Info: simvastatin, sulfa antibiotics           Discharge Medications:  Medication List    TAKE these medications    acetaminophen 500 MG tablet Commonly known as: TYLENOL Take 500-1,000 mg by mouth every 8 (eight) hours as needed for pain.   albuterol 108 (90 Base) MCG/ACT inhaler Commonly known as: VENTOLIN HFA Inhale 1-2 puffs into the lungs every 6 (six) hours as needed for wheezing or shortness of breath.   brimonidine 0.2 % ophthalmic solution Commonly known as: ALPHAGAN Place 1 drop into both eyes 2 (two) times daily.   Caltrate 600+D Plus Minerals 600-800 MG-UNIT Tabs Take 1 tablet by mouth daily.   levothyroxine 88 MCG tablet Commonly known as: SYNTHROID Take 88 mcg by mouth daily before breakfast.   loperamide 2 MG tablet Commonly known as: IMODIUM A-D Take 2-4 mg by mouth 4 (four) times daily as needed for diarrhea or loose stools. TAKE TWO TABLETS BY MOUTH AFTER 1ST LOOSE STOOL THEN 1 TABLET BY MOUTH AFTER EACH SUBSEQUENT LOOSE STOOL. ( DO NOT EXCEED 4 TABLETS IN 24 HOURS) **IF DIARRHEA PERSISTS LONGER THAN 24 HOURS CALL M.D.   meclizine 25 MG tablet Commonly known as: ANTIVERT Take 1 tablet (25 mg total) by mouth 3 (three) times daily.   mycophenolate 500 MG tablet Commonly known as: CELLCEPT Take 500-1,000 mg by mouth 2 (two) times daily.   oxyCODONE 10 mg 12 hr tablet Commonly known as: OXYCONTIN Take 1 tablet (10 mg total)  by mouth every 12 (twelve) hours.   oxyCODONE 5 MG immediate release tablet Commonly known as: Oxy IR/ROXICODONE Take 1 tablet (5 mg total) by mouth every 4 (four) hours as needed for breakthrough pain.   pantoprazole 40 MG tablet Commonly known as: PROTONIX Take 40 mg by mouth daily.   predniSONE 20 MG tablet Commonly known as: DELTASONE Take 20 mg by mouth daily.   pyridostigmine 60 MG tablet Commonly known as: MESTINON Take 60 mg by mouth 3 (three) times daily.   timolol 0.25 % ophthalmic solution Commonly known as: TIMOPTIC Apply 1 drop to eye 2 (two) times daily.   traMADol 50 MG tablet Commonly known as: ULTRAM Take 1  tablet (50 mg total) by mouth every 6 (six) hours as needed for severe pain.          Relevant Imaging Results:  Relevant Lab Results:   Additional Information    Chapman Fitch, RN

## 2020-04-30 DIAGNOSIS — W19XXXS Unspecified fall, sequela: Secondary | ICD-10-CM

## 2020-04-30 NOTE — TOC Transition Note (Signed)
Transition of Care [facility]) - CM/SW Discharge Note   Patient Details  Name: Gregory Small MRN: [id number] Date of Birth: 11-13-1919  [facility]) CM/SW Contact:  Maree Krabbe, LCSW Phone Number: 04/30/2020, 12:34 PM   Clinical Narrative:   Pt to d/c back to [facility] today. Fl2 and dc summary faxed to facility. Per CSW note yesterday pt's daughter will transport back. RN to call report to [phone].    Final next level of care: Home w Home Health Services Barriers to Discharge: No Barriers Identified   Patient Goals and CMS Choice Patient states their goals for this hospitalization and ongoing recovery are:: return to ALF      Discharge Placement              Patient chooses bed at:  [facility]) Patient to be transferred to facility by:  (pt's daughter)   Patient and family notified of of transfer: 04/30/20  Discharge Plan and Services                          HH Arranged: PT,OT [facility] Agency: [facility] Date [facility] Agency Contacted: 04/29/20   Representative spoke with at [facility] Agency: Kandee Keen  Social Determinants of Health (SDOH) Interventions     Readmission Risk Interventions No flowsheet data found.

## 2020-04-30 NOTE — Progress Notes (Signed)
Physical Therapy Treatment Patient Details Name: Gregory Small MRN: [id number] DOB: Jul 26, 1919 Today's Date: 04/30/2020    History of Present Illness Yohannes "Dick" Feil is a 89yoM who comes to [facility] on3/1 after acute onset vertigo and vomitting at his facility. Pt's memory precludes great detail at evaluation. PMH: HTN, hypotention, hypoTSH, recent fall c left hip pain. Brain scans reveal no acute abnormality. Pt was here last week after fall.    PT Comments    Pt in bed upon entry asleep, meal tray completed. Pt agreeable to session. Able to comes to EOB at supervision level, same with standing with RW, AMB c RW around th eunit slowly, movement arrested thrice by progressive left thigh cramping etiology unknown. Pt left up in chair, RN called for linen change and call bell. No dizziness today. Pt remains somewhat weak, difficulty with safety AMB, but balance improved. LLE continues to hurt with prolonged upright/AMB.    Follow Up Recommendations  SNF;Supervision for mobility/OOB;Supervision - Intermittent     Equipment Recommendations  None recommended by PT    Recommendations for Other Services       Precautions / Restrictions Precautions Precautions: Fall    Mobility  Bed Mobility Overal bed mobility: Needs Assistance Bed Mobility: Supine to Sit;Sit to Supine     Supine to sit: Supervision          Transfers Overall transfer level: Needs assistance Equipment used: Rolling walker (2 wheeled) Transfers: Sit to/from [employer] Sit to Stand: Min guard Stand pivot transfers: Min guard          Ambulation/Gait Ambulation/Gait assistance: Min guard Gait Distance (Feet): 230 Feet Assistive device: Rolling walker (2 wheeled) Gait Pattern/deviations: WFL(Within Functional Limits) Gait velocity: 0.57m/s   General Gait Details: left leg pain limitations   Stairs             Wheelchair Mobility    Modified Rankin (Stroke Patients  Only)       Balance Overall balance assessment: Needs assistance;Mild deficits observed, not formally tested                                          Cognition Arousal/Alertness: Awake/alert Behavior During Therapy: WFL for tasks assessed/performed Overall Cognitive Status: Within Functional Limits for tasks assessed                                        Exercises      General Comments        Pertinent Vitals/Pain Pain Assessment: Faces Faces Pain Scale: Hurts even more Pain Location: cramping pain in left anterolateral thigh Pain Descriptors / Indicators: Cramping Pain Intervention(s): Limited activity within patient's tolerance;Monitored during session    Home Living                      Prior Function            PT Goals (current goals can now be found in the care plan section) Acute Rehab PT Goals Patient Stated Goal: regain strength, stop c nausea PT Goal Formulation: With patient/family Time For Goal Achievement: 05/12/20 Potential to Achieve Goals: Fair Progress towards PT goals: Progressing toward goals    Frequency    Min 2X/week      PT Plan Current plan  remains appropriate    Co-evaluation              AM-PAC PT "6 Clicks" Mobility   Outcome Measure  Help needed turning from your back to your side while in a flat bed without using bedrails?: A Little Help needed moving from lying on your back to sitting on the side of a flat bed without using bedrails?: A Lot Help needed moving to and from a bed to a chair (including a wheelchair)?: A Little Help needed standing up from a chair using your arms (e.g., wheelchair or bedside chair)?: A Little Help needed to walk in hospital room?: A Little Help needed climbing 3-5 steps with a railing? : A Lot 6 Click Score: 16    End of Session Equipment Utilized During Treatment: Gait belt Activity Tolerance: Patient limited by pain;Patient limited by  fatigue Patient left: with call bell/phone within reach;in chair Nurse Communication: Mobility status PT Visit Diagnosis: Unsteadiness on feet (R26.81);Other abnormalities of gait and mobility (R26.89);History of falling (Z91.81);Difficulty in walking, not elsewhere classified (R26.2);Dizziness and giddiness (R42) Pain - Right/Left: Left Pain - part of body: Leg     Time: 7517-0017 PT Time Calculation (min) (ACUTE ONLY): 20 min  Charges:  $Therapeutic Exercise: 8-22 mins                     12:18 PM, 04/30/20 Rosamaria Lints, PT, DPT Physical Therapist - [facility]  [phone] (ASCOM)    Buccola,Allan C 04/30/2020, 12:16 PM

## 2020-04-30 NOTE — Discharge Summary (Signed)
Gregory Small WIO:[id number] DOB: 03/17/1919 DOA: 04/27/2020  PCP: [facility]  Admit date: 04/27/2020 Discharge date: 04/30/2020  [facility] Disposition:  Assisted Living  Recommendations for Outpatient Follow-up:  1. Follow up with PCP in 1 week 2. Please obtain BMP/CBC in one week   Home Health:PT/OT    Discharge Condition:Stable CODE STATUS:DNR  Diet recommendation: Heart Healthy   Brief/Interim Summary: Per HPI: Gregory Small is a 87 y.o. Caucasian male with medical history significant for hypertension and hypothyroidism who had a fall a couple weeks ago with subsequent left hip pain without fracture for which she has been on opiate narcotic therapy for pain at a skilled nursing facility.  Today his granddaughter was called as he was having dizziness and vomiting with associated vertigo.Chest x-ray showed no acute cardiopulmonary disease.  Noncontrasted head CT scan revealed no acute intracranial normalities.  Brain MRI showed cerebral atrophy and moderate chronic microvascular ischemic disease that is age-appropriate with no acute intracranial normality.    1. Benign positional vertigo.  Has hx/o vertigo Improving with higher dose of meclizine 25mg  tid MRI braine without acute abn. PT For possible vestibular rehabilitation [facility]ost[facility] neagtive   2. Hypothyroidism with chronic constipation. -TSH nml Continue synthroid  3. GERD. Continue PPI   4. Glaucoma. -continue opthlamic gtt   5. Myasthenia gravis. Continue outpatient medications  6. Recent fall and left hip pain. Continue pain meds PT    Discharge Diagnoses:  Active Problems:   Vertigo    Discharge Instructions  Discharge Instructions    Diet - low sodium heart healthy   Complete by: As directed    Discharge instructions   Complete by: As directed    F/u with pcp in one week   Discharge instructions   Complete by: As directed     Avoid caffeine F/u with pcp in one week   Increase activity slowly   Complete by: As directed    Increase activity slowly   Complete by: As directed      Allergies as of 04/30/2020      Reactions   Simvastatin Other (See Comments)   Myalgias    Sulfa Antibiotics       Medication List    TAKE these medications   acetaminophen 500 MG tablet Commonly known as: TYLENOL Take 500-1,000 mg by mouth every 8 (eight) hours as needed for pain.   albuterol 108 (90 Base) MCG/ACT inhaler Commonly known as: VENTOLIN HFA Inhale 1-2 puffs into the lungs every 6 (six) hours as needed for wheezing or shortness of breath.   brimonidine 0.2 % ophthalmic solution Commonly known as: ALPHAGAN Place 1 drop into both eyes 2 (two) times daily.   Caltrate 600+D Plus Minerals 600-800 MG-UNIT Tabs Take 1 tablet by mouth daily.   levothyroxine 88 MCG tablet Commonly known as: SYNTHROID Take 88 mcg by mouth daily before breakfast.   loperamide 2 MG tablet Commonly known as: IMODIUM A-D Take 2-4 mg by mouth 4 (four) times daily as needed for diarrhea or loose stools. TAKE TWO TABLETS BY MOUTH AFTER 1ST LOOSE STOOL THEN 1 TABLET BY MOUTH AFTER EACH SUBSEQUENT LOOSE STOOL. ( DO NOT EXCEED 4 TABLETS IN 24 HOURS) **IF DIARRHEA PERSISTS LONGER THAN 24 HOURS CALL M.D.   meclizine 25 MG tablet Commonly known as: ANTIVERT Take 1 tablet (25 mg total) by mouth 3 (three) times daily.   mycophenolate 500 MG tablet Commonly known as: CELLCEPT Take 500-1,000 mg by mouth 2 (two) times  daily.   oxyCODONE 10 mg 12 hr tablet Commonly known as: OXYCONTIN Take 1 tablet (10 mg total) by mouth every 12 (twelve) hours.   oxyCODONE 5 MG immediate release tablet Commonly known as: Oxy IR/ROXICODONE Take 1 tablet (5 mg total) by mouth every 4 (four) hours as needed for breakthrough pain.   pantoprazole 40 MG tablet Commonly known as: PROTONIX Take 40 mg by mouth daily.   predniSONE 20 MG tablet Commonly known as:  DELTASONE Take 20 mg by mouth daily.   pyridostigmine 60 MG tablet Commonly known as: MESTINON Take 60 mg by mouth 3 (three) times daily.   timolol 0.25 % ophthalmic solution Commonly known as: TIMOPTIC Apply 1 drop to eye 2 (two) times daily.   traMADol 50 MG tablet Commonly known as: ULTRAM Take 1 tablet (50 mg total) by mouth every 6 (six) hours as needed for severe pain.       Follow-up Information    [facility] Follow up in 1 week(s).   Specialty: Geriatric Medicine Contact information: [address] [place] [address] [phone]              Allergies  Allergen Reactions  . Simvastatin Other (See Comments)    Myalgias   . Sulfa Antibiotics     Consultations:     Procedures/Studies: DG Chest 1 View  Result Date: 04/27/2020 CLINICAL DATA:  Dizziness, vomiting EXAM: CHEST  1 VIEW COMPARISON:  None. FINDINGS: Single frontal view of the chest demonstrates an unremarkable cardiac silhouette. No acute airspace disease, effusion, or pneumothorax. No acute bony abnormalities. IMPRESSION: 1. No acute intrathoracic process. Electronically Signed   By: Sharlet Salina M.D.   On: 04/27/2020 21:48   DG Lumbar Spine Complete  Result Date: 04/19/2020 CLINICAL DATA:  Low back pain after fall 2 days ago. EXAM: LUMBAR SPINE - COMPLETE 4+ VIEW COMPARISON:  None. FINDINGS: Status post surgical posterior fusion of L4-5 and L5-S1 with bilateral intrapedicular screw placement and interbody fusion of L4-5. Minimal grade 1 retrolisthesis of L2-3 and L3-4 is noted secondary to moderate degenerative disc disease at these levels. Moderate degenerative disc disease is also noted at L1-2. No fracture is noted. IMPRESSION: Status post surgical posterior fusion of L4-5 and L5-S1. Multilevel degenerative disc disease is noted. No acute abnormality seen in the lumbar spine. Aortic Atherosclerosis (ICD10-I70.0). Electronically Signed   By: Lupita Raider M.D.    On: 04/19/2020 09:05   CT Head Wo Contrast  Result Date: 04/27/2020 CLINICAL DATA:  Headache dizzy EXAM: CT HEAD WITHOUT CONTRAST TECHNIQUE: Contiguous axial images were obtained from the base of the skull through the vertex without intravenous contrast. COMPARISON:  MRI 04/12/2009 FINDINGS: Brain: No acute territorial infarction, hemorrhage or intracranial mass. Moderate atrophy. Advanced white matter disease likely chronic small vessel ischemic change. Mildly prominent ventricles felt secondary to atrophy Vascular: No hyperdense vessels. Vertebral and carotid vascular calcification Skull: Normal. Negative for fracture or focal lesion. Sinuses/Orbits: No acute finding. Other: None IMPRESSION: 1. No CT evidence for acute intracranial abnormality. 2. Atrophy and small vessel ischemic changes of the white matter. Electronically Signed   By: Jasmine Pang M.D.   On: 04/27/2020 22:25   CT PELVIS WO CONTRAST  Result Date: 04/19/2020 CLINICAL DATA:  Fall with pelvic pain and left hip pain EXAM: CT PELVIS WITHOUT CONTRAST TECHNIQUE: Multidetector CT imaging of the pelvis was performed following the standard protocol without intravenous contrast. COMPARISON:  X-ray 04/19/2020 FINDINGS: Urinary Tract: Urinary  bladder is decompressed, limiting its evaluation. No distal ureteral dilation. Bowel: Scattered colonic diverticulosis. Visualized pelvic bowel loops otherwise within normal limits. No inflammatory changes evident. Vascular/Lymphatic: Extensive aortoiliac calcified atherosclerosis. No intrapelvic or inguinal lymphadenopathy. Reproductive:  No mass or other significant abnormality Other:  No free fluid within the pelvis.  No hernia visualized. Musculoskeletal: Postsurgical changes status post right total hip arthroplasty and L4-S1 posterior and interbody fusion and decompression. No evidence of hardware complication. Evidence of prior bone graft harvest site at the posterior left ilium. Bony pelvis is otherwise  intact without evidence of fracture or diastasis. Mild arthropathy of the SI joints and pubic symphysis. Mild left hip osteoarthritis. No left hip joint effusion is seen. Focal subcutaneous edema lateral to the left greater trochanter without focal hematoma. Mild enthesopathic changes at the bilateral gluteus medius tendon insertions and bilateral hamstring tendon origins. IMPRESSION: 1. No acute osseous abnormality of the pelvis. 2. Mild left hip osteoarthritis. 3. Focal subcutaneous edema lateral to the left greater trochanter without focal hematoma. 4. Postsurgical changes status post right total hip arthroplasty and lumbosacral fusion. No evidence of hardware complication. 5. Colonic diverticulosis without evidence of acute diverticulitis. 6. Aortic atherosclerosis (ICD10-I70.0). Electronically Signed   By: Duanne Guess D.O.   On: 04/19/2020 10:18   MR BRAIN WO CONTRAST  Result Date: 04/28/2020 CLINICAL DATA:  Initial evaluation for acute dizziness. EXAM: MRI HEAD WITHOUT CONTRAST TECHNIQUE: Multiplanar, multiecho pulse sequences of the brain and surrounding structures were obtained without intravenous contrast. COMPARISON:  Prior CT from earlier the same day. FINDINGS: Brain: Examination degraded by motion artifact. Generalized age-related cerebral atrophy. Patchy and confluent T2/FLAIR hyperintensity within the periventricular deep white matter both cerebral hemispheres most consistent with chronic small vessel ischemic disease, moderate in nature. No abnormal foci of restricted diffusion to suggest acute or subacute ischemia. Gray-white matter differentiation maintained. No encephalomalacia to suggest chronic cortical infarction. No acute intracranial hemorrhage. Single punctate focus of susceptibility artifact noted at the left occipital lobe, consistent with a small chronic microhemorrhage, of doubtful significance in isolation. No mass lesion, midline shift or mass effect. Diffuse ventricular  prominence related to global parenchymal volume loss without hydrocephalus. No extra-axial fluid collection. Pituitary gland suprasellar region within normal limits. Midline structures intact. Vascular: Major intracranial vascular flow voids are maintained. Skull and upper cervical spine: Craniocervical junction within normal limits. Bone marrow signal intensity normal. No scalp soft tissue abnormality. Sinuses/Orbits: Patient status post bilateral ocular lens replacement. Globes and orbital soft tissues demonstrate no acute finding. Paranasal sinuses are largely clear. No significant mastoid effusion. Other: None. IMPRESSION: 1. No acute intracranial abnormality. 2. Generalized age-related cerebral atrophy with moderate chronic microvascular ischemic disease. Electronically Signed   By: Rise Mu M.D.   On: 04/28/2020 00:39   DG Knee Complete 4 Views Left  Result Date: 04/20/2020 CLINICAL DATA:  87 year old male with fall and left knee pain. EXAM: LEFT KNEE - COMPLETE 4+ VIEW COMPARISON:  None. FINDINGS: There is no acute fracture or dislocation. The bones are osteopenic. There is osteoarthritic changes of the knee with severe narrowing of the medial compartment. There is meniscal chondrocalcinosis. No joint effusion. The soft tissues are unremarkable. IMPRESSION: 1. No acute fracture or dislocation. 2. Osteopenia with osteoarthritic changes. Electronically Signed   By: Elgie Collard M.D.   On: 04/20/2020 20:34   DG HIP UNILAT WITH PELVIS 2-3 VIEWS LEFT  Result Date: 04/19/2020 CLINICAL DATA:  Left hip pain after fall. EXAM: DG HIP (WITH OR WITHOUT PELVIS)  2-3V LEFT COMPARISON:  None. FINDINGS: There is no evidence of hip fracture or dislocation. Mild osteophyte formation of the left hip is noted. IMPRESSION: Mild degenerative joint disease of the left hip. No acute abnormality is noted. Electronically Signed   By: Lupita Raider M.D.   On: 04/19/2020 09:04       Subjective: Feels  better today. Less dizzy.  Discharge Exam: Vitals:   04/30/20 0440 04/30/20 0754  BP: (!) 153/78 (!) 158/96  Pulse: 65 76  Resp: 16 20  Temp: (!) 97.3 F (36.3 C) 98.2 F (36.8 C)  SpO2: 100% 100%   Vitals:   04/29/20 2029 04/30/20 0045 04/30/20 0440 04/30/20 0754  BP: 129/77 (!) 146/65 (!) 153/78 (!) 158/96  Pulse: 69 (!) 56 65 76  Resp: Temp: 98.5 F (36.9 C) 97.7 F (36.5 C) (!) 97.3 F (36.3 C) 98.2 F (36.8 C)  TempSrc: Oral Oral Oral   SpO2: 99% 97% 100% 100%  Weight:      Height:        General: Pt is alert, awake, not in acute distress Cardiovascular: RRR, S1/S2 +, no rubs, no gallops Respiratory: CTA bilaterally, no wheezing, no rhonchi Abdominal: Soft, NT, ND, bowel sounds + Extremities: no edema    The results of significant diagnostics from this hospitalization (including imaging, microbiology, ancillary and laboratory) are listed below for reference.     Microbiology: Recent Results (from the past 240 hour(s))  Resp Panel by RT-PCR (Flu A&B, Covid) Nasopharyngeal Swab     Status: None   Collection Time: 04/27/20  9:48 PM   Specimen: Nasopharyngeal Swab; Nasopharyngeal(NP) swabs in vial transport medium  Result Value Ref Range Status   SARS Coronavirus 2 by RT PCR NEGATIVE NEGATIVE Final    Comment: (NOTE) SARS-CoV-2 target nucleic acids are NOT DETECTED.  The SARS-CoV-2 RNA is generally detectable in upper respiratory specimens during the acute phase of infection. The lowest concentration of SARS-CoV-2 viral copies this assay can detect is 138 copies/mL. A negative result does not preclude SARS-Cov-2 infection and should not be used as the sole basis for treatment or other patient management decisions. A negative result may occur with  improper specimen collection/handling, submission of specimen other than nasopharyngeal swab, presence of viral mutation(s) within the areas targeted by this assay, and inadequate number of  viral copies(<138 copies/mL). A negative result must be combined with clinical observations, patient history, and epidemiological information. The expected result is Negative.  Fact Sheet for Patients:  BloggerCourse.com  Fact Sheet for Healthcare Providers:  SeriousBroker.it  This test is no t yet approved or cleared by the [place] FDA and  has been authorized for detection and/or diagnosis of SARS-CoV-2 by FDA under an Emergency Use Authorization (EUA). This EUA will remain  in effect (meaning this test can be used) for the duration of the COVID-19 declaration under Section 564(b)(1) of the Act, 21 U.S.C.section 360bbb-3(b)(1), unless the authorization is terminated  or revoked sooner.       Influenza A by PCR NEGATIVE NEGATIVE Final   Influenza B by PCR NEGATIVE NEGATIVE Final    Comment: (NOTE) The Xpert Xpress SARS-CoV-2/FLU/RSV plus assay is intended as an aid in the diagnosis of influenza from Nasopharyngeal swab specimens and should not be used as a sole basis for treatment. Nasal washings and aspirates are unacceptable for Xpert Xpress SARS-CoV-2/FLU/RSV testing.  Fact Sheet for Patients: BloggerCourse.com  Fact Sheet for Healthcare Providers: SeriousBroker.it  This test  is not yet approved or cleared by the [place] FDA and has been authorized for detection and/or diagnosis of SARS-CoV-2 by FDA under an Emergency Use Authorization (EUA). This EUA will remain in effect (meaning this test can be used) for the duration of the COVID-19 declaration under Section 564(b)(1) of the Act, 21 U.S.C. section 360bbb-3(b)(1), unless the authorization is terminated or revoked.  Performed at [facility], [address] [place], [place] [address]      Labs: BNP (last 3 results) No results for input(s): BNP in the last 8760 hours. Basic Metabolic  Panel: Recent Labs  Lab 04/27/20 2131 04/28/20 0647  NA 139 142  K 3.6 4.6  CL 106 106  CO2 24 27  GLUCOSE 152* 93  BUN 24* 22  CREATININE 1.17 1.10  CALCIUM 8.1* 8.7*   Liver Function Tests: Recent Labs  Lab 04/27/20 2131  AST 37  ALT 35  ALKPHOS 52  BILITOT 0.7  PROT 6.0*  ALBUMIN 3.4*   No results for input(s): LIPASE, AMYLASE in the last 168 hours. No results for input(s): AMMONIA in the last 168 hours. CBC: Recent Labs  Lab 04/27/20 2131 04/28/20 0647 04/28/20 1527  WBC 10.5 13.7* 10.7*  NEUTROABS 8.6*  --   --   HGB 12.2* 12.7* 12.0*  HCT 39.2 39.9 39.0  MCV 95.8 95.7 96.8  PLT 227 205 208   Cardiac Enzymes: No results for input(s): CKTOTAL, CKMB, CKMBINDEX, TROPONINI in the last 168 hours. BNP: Invalid input(s): POCBNP CBG: No results for input(s): GLUCAP in the last 168 hours. D-Dimer No results for input(s): DDIMER in the last 72 hours. Hgb A1c No results for input(s): HGBA1C in the last 72 hours. Lipid Profile No results for input(s): CHOL, HDL, LDLCALC, TRIG, CHOLHDL, LDLDIRECT in the last 72 hours. Thyroid function studies Recent Labs    04/28/20 0647  TSH 0.872   Anemia work up No results for input(s): VITAMINB12, FOLATE, FERRITIN, TIBC, IRON, RETICCTPCT in the last 72 hours. Urinalysis    Component Value Date/Time   COLORURINE YELLOW (A) 04/28/2020 0103   APPEARANCEUR HAZY (A) 04/28/2020 0103   LABSPEC 1.028 04/28/2020 0103   PHURINE 5.0 04/28/2020 0103   GLUCOSEU NEGATIVE 04/28/2020 0103   HGBUR NEGATIVE 04/28/2020 0103   BILIRUBINUR NEGATIVE 04/28/2020 0103   KETONESUR NEGATIVE 04/28/2020 0103   PROTEINUR 30 (A) 04/28/2020 0103   NITRITE NEGATIVE 04/28/2020 0103   LEUKOCYTESUR NEGATIVE 04/28/2020 0103   Sepsis Labs Invalid input(s): PROCALCITONIN,  WBC,  LACTICIDVEN Microbiology Recent Results (from the past 240 hour(s))  Resp Panel by RT-PCR (Flu A&B, Covid) Nasopharyngeal Swab     Status: None   Collection Time:  04/27/20  9:48 PM   Specimen: Nasopharyngeal Swab; Nasopharyngeal(NP) swabs in vial transport medium  Result Value Ref Range Status   SARS Coronavirus 2 by RT PCR NEGATIVE NEGATIVE Final    Comment: (NOTE) SARS-CoV-2 target nucleic acids are NOT DETECTED.  The SARS-CoV-2 RNA is generally detectable in upper respiratory specimens during the acute phase of infection. The lowest concentration of SARS-CoV-2 viral copies this assay can detect is 138 copies/mL. A negative result does not preclude SARS-Cov-2 infection and should not be used as the sole basis for treatment or other patient management decisions. A negative result may occur with  improper specimen collection/handling, submission of specimen other than nasopharyngeal swab, presence of viral mutation(s) within the areas targeted by this assay, and inadequate number of viral copies(<138 copies/mL). A negative result must be combined  with clinical observations, patient history, and epidemiological information. The expected result is Negative.  Fact Sheet for Patients:  BloggerCourse.com  Fact Sheet for Healthcare Providers:  SeriousBroker.it  This test is no t yet approved or cleared by the [place] FDA and  has been authorized for detection and/or diagnosis of SARS-CoV-2 by FDA under an Emergency Use Authorization (EUA). This EUA will remain  in effect (meaning this test can be used) for the duration of the COVID-19 declaration under Section 564(b)(1) of the Act, 21 U.S.C.section 360bbb-3(b)(1), unless the authorization is terminated  or revoked sooner.       Influenza A by PCR NEGATIVE NEGATIVE Final   Influenza B by PCR NEGATIVE NEGATIVE Final    Comment: (NOTE) The Xpert Xpress SARS-CoV-2/FLU/RSV plus assay is intended as an aid in the diagnosis of influenza from Nasopharyngeal swab specimens and should not be used as a sole basis for treatment. Nasal washings  and aspirates are unacceptable for Xpert Xpress SARS-CoV-2/FLU/RSV testing.  Fact Sheet for Patients: BloggerCourse.com  Fact Sheet for Healthcare Providers: SeriousBroker.it  This test is not yet approved or cleared by the [place] FDA and has been authorized for detection and/or diagnosis of SARS-CoV-2 by FDA under an Emergency Use Authorization (EUA). This EUA will remain in effect (meaning this test can be used) for the duration of the COVID-19 declaration under Section 564(b)(1) of the Act, 21 U.S.C. section 360bbb-3(b)(1), unless the authorization is terminated or revoked.  Performed at [facility], [address] [place], [place] [address]      Time coordinating discharge: Over 30 minutes  SIGNED:   Lynn Ito, MD  [facility] 04/30/2020, 11:32 AM Pager   If 7PM-7AM, please contact night-coverage [email or web address] Password [email or web address]

## 2020-06-04 ENCOUNTER — Emergency Department: Payer: Medicare Other

## 2020-06-04 ENCOUNTER — Encounter: Payer: Self-pay | Admitting: Internal Medicine

## 2020-06-04 ENCOUNTER — Inpatient Hospital Stay
Admission: EM | Admit: 2020-06-04 | Discharge: 2020-06-14 | DRG: 871 | Disposition: A | Discharge status: Skilled Nursing Facility | Payer: Medicare Other | Source: Skilled Nursing Facility | Attending: Hospitalist | Admitting: Hospitalist

## 2020-06-04 ENCOUNTER — Other Ambulatory Visit: Payer: Self-pay

## 2020-06-04 ENCOUNTER — Inpatient Hospital Stay: Payer: Medicare Other

## 2020-06-04 DIAGNOSIS — Z66 Do not resuscitate: Secondary | ICD-10-CM | POA: Diagnosis present

## 2020-06-04 DIAGNOSIS — M25551 Pain in right hip: Secondary | ICD-10-CM | POA: Diagnosis present

## 2020-06-04 DIAGNOSIS — L03113 Cellulitis of right upper limb: Secondary | ICD-10-CM | POA: Diagnosis present

## 2020-06-04 DIAGNOSIS — Z7952 Long term (current) use of systemic steroids: Secondary | ICD-10-CM | POA: Diagnosis not present

## 2020-06-04 DIAGNOSIS — U071 COVID-19: Secondary | ICD-10-CM | POA: Diagnosis present

## 2020-06-04 DIAGNOSIS — R059 Cough, unspecified: Secondary | ICD-10-CM

## 2020-06-04 DIAGNOSIS — Z7901 Long term (current) use of anticoagulants: Secondary | ICD-10-CM | POA: Diagnosis not present

## 2020-06-04 DIAGNOSIS — L02511 Cutaneous abscess of right hand: Secondary | ICD-10-CM | POA: Diagnosis present

## 2020-06-04 DIAGNOSIS — Z96641 Presence of right artificial hip joint: Secondary | ICD-10-CM | POA: Diagnosis present

## 2020-06-04 DIAGNOSIS — Z79890 Hormone replacement therapy (postmenopausal): Secondary | ICD-10-CM

## 2020-06-04 DIAGNOSIS — R6520 Severe sepsis without septic shock: Secondary | ICD-10-CM | POA: Diagnosis present

## 2020-06-04 DIAGNOSIS — R197 Diarrhea, unspecified: Secondary | ICD-10-CM | POA: Diagnosis present

## 2020-06-04 DIAGNOSIS — K219 Gastro-esophageal reflux disease without esophagitis: Secondary | ICD-10-CM | POA: Diagnosis present

## 2020-06-04 DIAGNOSIS — Z882 Allergy status to sulfonamides status: Secondary | ICD-10-CM | POA: Diagnosis not present

## 2020-06-04 DIAGNOSIS — Z79899 Other long term (current) drug therapy: Secondary | ICD-10-CM

## 2020-06-04 DIAGNOSIS — I10 Essential (primary) hypertension: Secondary | ICD-10-CM | POA: Diagnosis present

## 2020-06-04 DIAGNOSIS — G894 Chronic pain syndrome: Secondary | ICD-10-CM | POA: Diagnosis present

## 2020-06-04 DIAGNOSIS — N1831 Chronic kidney disease, stage 3a: Secondary | ICD-10-CM | POA: Diagnosis present

## 2020-06-04 DIAGNOSIS — I482 Chronic atrial fibrillation, unspecified: Secondary | ICD-10-CM | POA: Diagnosis present

## 2020-06-04 DIAGNOSIS — N179 Acute kidney failure, unspecified: Secondary | ICD-10-CM | POA: Diagnosis present

## 2020-06-04 DIAGNOSIS — W19XXXA Unspecified fall, initial encounter: Secondary | ICD-10-CM | POA: Diagnosis present

## 2020-06-04 DIAGNOSIS — Z95828 Presence of other vascular implants and grafts: Secondary | ICD-10-CM

## 2020-06-04 DIAGNOSIS — R7881 Bacteremia: Secondary | ICD-10-CM | POA: Diagnosis not present

## 2020-06-04 DIAGNOSIS — N183 Chronic kidney disease, stage 3 unspecified: Secondary | ICD-10-CM | POA: Diagnosis present

## 2020-06-04 DIAGNOSIS — M25561 Pain in right knee: Secondary | ICD-10-CM | POA: Diagnosis present

## 2020-06-04 DIAGNOSIS — W1830XA Fall on same level, unspecified, initial encounter: Secondary | ICD-10-CM | POA: Diagnosis present

## 2020-06-04 DIAGNOSIS — M65141 Other infective (teno)synovitis, right hand: Secondary | ICD-10-CM | POA: Diagnosis present

## 2020-06-04 DIAGNOSIS — A4101 Sepsis due to Methicillin susceptible Staphylococcus aureus: Principal | ICD-10-CM

## 2020-06-04 DIAGNOSIS — I129 Hypertensive chronic kidney disease with stage 1 through stage 4 chronic kidney disease, or unspecified chronic kidney disease: Secondary | ICD-10-CM | POA: Diagnosis present

## 2020-06-04 DIAGNOSIS — A419 Sepsis, unspecified organism: Secondary | ICD-10-CM | POA: Insufficient documentation

## 2020-06-04 DIAGNOSIS — G7000 Myasthenia gravis without (acute) exacerbation: Secondary | ICD-10-CM | POA: Diagnosis present

## 2020-06-04 DIAGNOSIS — Z888 Allergy status to other drugs, medicaments and biological substances status: Secondary | ICD-10-CM

## 2020-06-04 DIAGNOSIS — T50B95D Adverse effect of other viral vaccines, subsequent encounter: Secondary | ICD-10-CM

## 2020-06-04 DIAGNOSIS — E039 Hypothyroidism, unspecified: Secondary | ICD-10-CM | POA: Diagnosis present

## 2020-06-04 DIAGNOSIS — I878 Other specified disorders of veins: Secondary | ICD-10-CM | POA: Diagnosis present

## 2020-06-04 DIAGNOSIS — L0291 Cutaneous abscess, unspecified: Secondary | ICD-10-CM

## 2020-06-04 LAB — CBC WITH DIFFERENTIAL/PLATELET
Abs Immature Granulocytes: 0.11 10*3/uL — ABNORMAL HIGH (ref 0.00–0.07)
Basophils Absolute: 0 10*3/uL (ref 0.0–0.1)
Basophils Relative: 0 %
Eosinophils Absolute: 0 10*3/uL (ref 0.0–0.5)
Eosinophils Relative: 0 %
HCT: 39.5 % (ref 39.0–52.0)
Hemoglobin: 12.4 g/dL — ABNORMAL LOW (ref 13.0–17.0)
Immature Granulocytes: 1 %
Lymphocytes Relative: 7 %
Lymphs Abs: 1.1 10*3/uL (ref 0.7–4.0)
MCH: 30.5 pg (ref 26.0–34.0)
MCHC: 31.4 g/dL (ref 30.0–36.0)
MCV: 97.1 fL (ref 80.0–100.0)
Monocytes Absolute: 1.6 10*3/uL — ABNORMAL HIGH (ref 0.1–1.0)
Monocytes Relative: 10 %
Neutro Abs: 12.9 10*3/uL — ABNORMAL HIGH (ref 1.7–7.7)
Neutrophils Relative %: 82 %
Platelets: 186 10*3/uL (ref 150–400)
RBC: 4.07 MIL/uL — ABNORMAL LOW (ref 4.22–5.81)
RDW: 16.4 % — ABNORMAL HIGH (ref 11.5–15.5)
WBC: 15.8 10*3/uL — ABNORMAL HIGH (ref 4.0–10.5)
nRBC: 0 % (ref 0.0–0.2)

## 2020-06-04 LAB — COMPREHENSIVE METABOLIC PANEL
ALT: 21 U/L (ref 0–44)
AST: 48 U/L — ABNORMAL HIGH (ref 15–41)
Albumin: 3.4 g/dL — ABNORMAL LOW (ref 3.5–5.0)
Alkaline Phosphatase: 55 U/L (ref 38–126)
Anion gap: 9 (ref 5–15)
BUN: 23 mg/dL (ref 8–23)
CO2: 28 mmol/L (ref 22–32)
Calcium: 8.9 mg/dL (ref 8.9–10.3)
Chloride: 100 mmol/L (ref 98–111)
Creatinine, Ser: 1.48 mg/dL — ABNORMAL HIGH (ref 0.61–1.24)
GFR, Estimated: 42 mL/min — ABNORMAL LOW (ref >60)
Glucose, Bld: 118 mg/dL — ABNORMAL HIGH (ref 70–99)
Potassium: 4.9 mmol/L (ref 3.5–5.1)
Sodium: 137 mmol/L (ref 135–145)
Total Bilirubin: 1.9 mg/dL — ABNORMAL HIGH (ref 0.3–1.2)
Total Protein: 6.8 g/dL (ref 6.5–8.1)

## 2020-06-04 LAB — C-REACTIVE PROTEIN: CRP: 34.1 mg/dL — ABNORMAL HIGH (ref <1.0)

## 2020-06-04 LAB — SEDIMENTATION RATE: Sed Rate: 54 mm/hr — ABNORMAL HIGH (ref 0–20)

## 2020-06-04 LAB — SARS CORONAVIRUS 2 (TAT 6-24 HRS): SARS Coronavirus 2: POSITIVE — AB

## 2020-06-04 LAB — LACTIC ACID, PLASMA: Lactic Acid, Venous: 1.5 mmol/L (ref 0.5–1.9)

## 2020-06-04 LAB — PROCALCITONIN: Procalcitonin: 1.12 ng/mL

## 2020-06-04 LAB — BRAIN NATRIURETIC PEPTIDE: B Natriuretic Peptide: 192.6 pg/mL — ABNORMAL HIGH (ref 0.0–100.0)

## 2020-06-04 MED ORDER — HYDRALAZINE HCL 20 MG/ML IJ SOLN
5.0000 mg | INTRAMUSCULAR | Status: DC | PRN
  Administered 2020-06-07: 5 mg via INTRAVENOUS
  Filled 2020-06-04: qty 1

## 2020-06-04 MED ORDER — VANCOMYCIN HCL IN DEXTROSE 1-5 GM/200ML-% IV SOLN
1000.0000 mg | Freq: Once | INTRAVENOUS | Status: AC
  Administered 2020-06-04: 1000 mg via INTRAVENOUS
  Filled 2020-06-04: qty 200

## 2020-06-04 MED ORDER — OXYCODONE HCL 5 MG PO TABS
5.0000 mg | ORAL_TABLET | Freq: Four times a day (QID) | ORAL | Status: DC | PRN
  Administered 2020-06-05 – 2020-06-13 (×11): 5 mg via ORAL
  Filled 2020-06-04 (×11): qty 1

## 2020-06-04 MED ORDER — HYDROCORTISONE NA SUCCINATE PF 100 MG IJ SOLR
50.0000 mg | Freq: Three times a day (TID) | INTRAMUSCULAR | Status: DC
  Administered 2020-06-04 – 2020-06-07 (×10): 50 mg via INTRAVENOUS
  Filled 2020-06-04 (×8): qty 1
  Filled 2020-06-04: qty 2
  Filled 2020-06-04 (×3): qty 1

## 2020-06-04 MED ORDER — LOPERAMIDE HCL 2 MG PO CAPS: 2.0000 mg | ORAL_CAPSULE | Freq: Four times a day (QID) | ORAL | Status: DC | PRN

## 2020-06-04 MED ORDER — ENOXAPARIN SODIUM 30 MG/0.3ML ~~LOC~~ SOLN
30.0000 mg | SUBCUTANEOUS | Status: DC
  Administered 2020-06-04: 30 mg via SUBCUTANEOUS
  Filled 2020-06-04: qty 0.3

## 2020-06-04 MED ORDER — MECLIZINE HCL 25 MG PO TABS
25.0000 mg | ORAL_TABLET | Freq: Three times a day (TID) | ORAL | Status: DC | PRN
  Filled 2020-06-04: qty 1

## 2020-06-04 MED ORDER — CALCIUM CARBONATE ANTACID 500 MG PO CHEW
600.0000 mg | CHEWABLE_TABLET | Freq: Every day | ORAL | Status: DC
  Administered 2020-06-05 – 2020-06-14 (×9): 600 mg via ORAL
  Filled 2020-06-04 (×9): qty 3

## 2020-06-04 MED ORDER — ONDANSETRON HCL 4 MG/2ML IJ SOLN: 4.0000 mg | Freq: Three times a day (TID) | INTRAMUSCULAR | Status: DC | PRN

## 2020-06-04 MED ORDER — DM-GUAIFENESIN ER 30-600 MG PO TB12
1.0000 | ORAL_TABLET | Freq: Two times a day (BID) | ORAL | Status: DC | PRN
  Administered 2020-06-05 – 2020-06-12 (×4): 1 via ORAL
  Filled 2020-06-04 (×5): qty 1

## 2020-06-04 MED ORDER — ALBUTEROL SULFATE HFA 108 (90 BASE) MCG/ACT IN AERS
2.0000 | INHALATION_SPRAY | RESPIRATORY_TRACT | Status: DC | PRN
  Filled 2020-06-04: qty 6.7

## 2020-06-04 MED ORDER — SODIUM CHLORIDE 0.9 % IV SOLN
2.0000 g | INTRAVENOUS | Status: DC
  Administered 2020-06-05: 2 g via INTRAVENOUS
  Filled 2020-06-04: qty 2

## 2020-06-04 MED ORDER — PYRIDOSTIGMINE BROMIDE 60 MG PO TABS
60.0000 mg | ORAL_TABLET | Freq: Three times a day (TID) | ORAL | Status: DC
  Administered 2020-06-04 – 2020-06-13 (×26): 60 mg via ORAL
  Filled 2020-06-04 (×32): qty 1

## 2020-06-04 MED ORDER — PANTOPRAZOLE SODIUM 40 MG PO TBEC
40.0000 mg | DELAYED_RELEASE_TABLET | Freq: Every day | ORAL | Status: DC
  Administered 2020-06-05 – 2020-06-14 (×10): 40 mg via ORAL
  Filled 2020-06-04 (×10): qty 1

## 2020-06-04 MED ORDER — TIMOLOL MALEATE 0.25 % OP SOLN
1.0000 [drp] | Freq: Two times a day (BID) | OPHTHALMIC | Status: DC
  Administered 2020-06-04 – 2020-06-13 (×19): 1 [drp] via OPHTHALMIC
  Filled 2020-06-04: qty 5

## 2020-06-04 MED ORDER — ALBUTEROL SULFATE HFA 108 (90 BASE) MCG/ACT IN AERS
1.0000 | INHALATION_SPRAY | Freq: Four times a day (QID) | RESPIRATORY_TRACT | Status: DC | PRN
  Filled 2020-06-04: qty 6.7

## 2020-06-04 MED ORDER — ACETAMINOPHEN 325 MG PO TABS
650.0000 mg | ORAL_TABLET | Freq: Four times a day (QID) | ORAL | Status: DC | PRN
  Administered 2020-06-04 – 2020-06-12 (×4): 650 mg via ORAL
  Filled 2020-06-04 (×4): qty 2

## 2020-06-04 MED ORDER — VANCOMYCIN HCL 1500 MG/300ML IV SOLN: 1500.0000 mg | INTRAVENOUS | Status: DC

## 2020-06-04 MED ORDER — SODIUM CHLORIDE 0.9 % IV BOLUS
500.0000 mL | Freq: Once | INTRAVENOUS | Status: AC
  Administered 2020-06-04: 500 mL via INTRAVENOUS

## 2020-06-04 MED ORDER — ENOXAPARIN SODIUM 40 MG/0.4ML ~~LOC~~ SOLN: 40.0000 mg | SUBCUTANEOUS | Status: DC

## 2020-06-04 MED ORDER — SODIUM CHLORIDE 0.9 % IV SOLN
2.0000 g | Freq: Once | INTRAVENOUS | Status: AC
  Administered 2020-06-04: 2 g via INTRAVENOUS
  Filled 2020-06-04: qty 2

## 2020-06-04 MED ORDER — LEVOTHYROXINE SODIUM 88 MCG PO TABS
88.0000 ug | ORAL_TABLET | Freq: Every day | ORAL | Status: DC
  Administered 2020-06-05 – 2020-06-14 (×9): 88 ug via ORAL
  Filled 2020-06-04 (×10): qty 1

## 2020-06-04 MED ORDER — CALCIUM CARBONATE-VITAMIN D 500-200 MG-UNIT PO TABS
1.0000 | ORAL_TABLET | Freq: Every day | ORAL | Status: DC
  Administered 2020-06-05 – 2020-06-14 (×9): 1 via ORAL
  Filled 2020-06-04 (×9): qty 1

## 2020-06-04 MED ORDER — BRIMONIDINE TARTRATE 0.2 % OP SOLN
1.0000 [drp] | Freq: Two times a day (BID) | OPHTHALMIC | Status: DC
  Administered 2020-06-04 – 2020-06-14 (×20): 1 [drp] via OPHTHALMIC
  Filled 2020-06-04: qty 5

## 2020-06-04 MED ORDER — SODIUM CHLORIDE 0.9 % IV SOLN: 1.0000 g | INTRAVENOUS | Status: DC

## 2020-06-04 NOTE — ED Notes (Signed)
Informed Rn bed assigned [id number]

## 2020-06-04 NOTE — ED Triage Notes (Signed)
Pt brought in via EMS from [facility] for unwitnessed fall.  C/O right knee pain, possible dislocation and right arm pain, swelling and redness.  Staff reports pt tested covid + yesterday at facility.  Edema present to BLE, baseline.

## 2020-06-04 NOTE — Progress Notes (Signed)
PHARMACIST - PHYSICIAN COMMUNICATION  CONCERNING:  Enoxaparin (Lovenox) for DVT Prophylaxis    RECOMMENDATION: Patient was prescribed enoxaprin 40mg  q24 hours for VTE prophylaxis.   Filed Weights   06/04/20 0924  Weight: 84.9 kg (187 lb 1.6 oz)    Body mass index is 29.3 kg/m.  Estimated Creatinine Clearance: 29 mL/min (A) (by C-G formula based on SCr of 1.48 mg/dL (H)).  Patient is candidate for enoxaparin 30mg  every 24 hours based on CrCl <76ml/min or Weight <45kg  DESCRIPTION: Pharmacy has adjusted enoxaparin dose per [facility] policy.  Patient is now receiving enoxaparin 30 mg every 24 hours    31m, PharmD Clinical Pharmacist  06/04/2020 5:45 PM

## 2020-06-04 NOTE — Consult Note (Signed)
Pharmacy Antibiotic Note  Gregory Small is a 87 y.o. male admitted on 06/04/2020 with right arm pain, swelling and redness following unwitnessed fall. PMH significant for myasthenia gravis/immunosuppression.  Pharmacy has been consulted for vancomycin dosing for cellulitis.   [place] of right arm pending Patient also ordered ceftriaxone 2 gram Q24H  Plan:  Vancomycin 1000 mg given in ED, will given another 1000 mg IV dose for total LD of 2000 mg x 1  Maintenance regimen of 1500 mg Q48H. Goal AUC: 400-550  Expected AUC: 471; Cssmin: 10.3  Scr used: 1.48  Monitor renal function daily   Height: 5\' 7"  (170.2 cm) Weight: 84.9 kg (187 lb 1.6 oz) IBW/kg (Calculated) : 66.1  Temp (24hrs), Avg:98.4 F (36.9 C), Min:98.4 F (36.9 C), Max:98.4 F (36.9 C)  Recent Labs  Lab 06/04/20 0939 06/04/20 1129  WBC 15.8*  --   CREATININE 1.48*  --   LATICACIDVEN  --  1.5    Estimated Creatinine Clearance: 29 mL/min (A) (by C-G formula based on SCr of 1.48 mg/dL (H)).    Allergies  Allergen Reactions  . Simvastatin Other (See Comments)    Myalgias   . Sulfa Antibiotics     Antimicrobials this admission: 4/8 cefepime x 1   4/8 vancomycin >>  4/8 ceftriaxone >>  Dose adjustments this admission: n/a  Microbiology results: 4/8 BCx: sent  Thank you for allowing pharmacy to be a part of this patient's care.  6/8, PharmD, BCPS Clinical Pharmacist  06/04/2020 12:39 PM

## 2020-06-04 NOTE — H&P (Signed)
History and Physical    Gregory Small HGD:[id number] DOB: 25-Dec-1919 DOA: 06/04/2020  Referring MD/NP/PA:   PCP: [facility]   Patient coming from:  The patient is coming from ALF.    Chief Complaint: fall, pain in right hip and right knee, right arm pain and redness,   HPI: Gregory Small is a 87 y.o. male with medical history significant of hypertension, GERD, hypothyroidism, vertigo, atrial fibrillation on anticoagulants, chronic pain syndrome, CKD stage IIIa, myasthenia gravis after receiving 2 doses of COVID vaccine, who presents with a fall, pain in right hip and right knee, right arm pain and redness.  Per her daughter, patient had unwitnessed fall in the early morning.  Daughter does not think patient has LOC.  Patient complains of pain in the right hip and right knee.  Per daughter, patient has pain in right arm which has been going on since Monday.  The right arm is also swelling and erythematous.  Daughter states the patient had positive Covid test yesterday in the facility.  Patient has mild dry cough, mild shortness breath, no fever or chills.  Denies chest pain.  Patient does not have nausea, vomiting or abdominal pain.  Patient has mild intermittent diarrhea at normal baseline.  No symptoms of UTI.  Daughter states that patient's myasthenia gravis seems to be stable.  ED Course: pt was found to have WBC 15.8, pending COVID-19 PCR, renal function close to baseline, temperature normal, blood pressure 167/89, heart rate 102, RR 18, oxygen saturation 98% on room air.  Chest x-ray negative.  X-ray of right knee and right hip is negative for acute bony fracture.  CT of head is negative for acute intracranial abnormalities.  CT of C-spine is negative for acute fracture, but showed degenerative disc disease.  Patient is admitted to [facility] bed as inpatient   Review of Systems:   General: no fevers, chills, no body weight gain, has fatigue HEENT: no blurry  vision, hearing changes or sore throat Respiratory: has dyspnea, coughing, no wheezing CV: no chest pain, no palpitations GI: no nausea, vomiting, abdominal pain, diarrhea, constipation GU: no dysuria, burning on urination, increased urinary frequency, hematuria  Ext: has leg edema.  Neuro: has fall.  No unilateral numbness or tingling his extremities. Skin: no rash, no skin tear. MSK: has pain in right hip and right knee. Has swelling, erythema and pain in right arm Heme: No easy bruising.  Travel history: No recent long distant travel.  Allergy:  Allergies  Allergen Reactions  . Simvastatin Other (See Comments)    Myalgias   . Sulfa Antibiotics     Past Medical History:  Diagnosis Date  . Hypertension   . Hypothyroidism   . Myasthenia gravis [facility])     Past Surgical History:  Procedure Laterality Date  . BACK SURGERY      Social History:  reports that he has never smoked. He does not have any smokeless tobacco history on file. He reports previous alcohol use. He reports that he does not use drugs.  Family History:  Family History  Problem Relation Age of Onset  . Cataracts Daughter      Prior to Admission medications   Medication Sig Start Date End Date Taking? Authorizing Provider  acetaminophen (TYLENOL) 500 MG tablet Take 500-1,000 mg by mouth every 8 (eight) hours as needed for pain. 12/24/19   [provider]  albuterol (VENTOLIN HFA) 108 (90 Base) MCG/ACT inhaler Inhale 1-2 puffs into the lungs every  6 (six) hours as needed for wheezing or shortness of breath. 04/15/20   [provider]  brimonidine (ALPHAGAN) 0.2 % ophthalmic solution Place 1 drop into both eyes 2 (two) times daily. 04/12/20   [provider]  Calcium Carbonate-Vit D-Min (CALTRATE 600+D PLUS MINERALS) 600-800 MG-UNIT TABS Take 1 tablet by mouth daily.    [provider]  levothyroxine (SYNTHROID) 88 MCG tablet Take 88 mcg by mouth daily before breakfast. 11/12/19    [provider]  loperamide (IMODIUM A-D) 2 MG tablet Take 2-4 mg by mouth 4 (four) times daily as needed for diarrhea or loose stools. TAKE TWO TABLETS BY MOUTH AFTER 1ST LOOSE STOOL THEN 1 TABLET BY MOUTH AFTER EACH SUBSEQUENT LOOSE STOOL. ( DO NOT EXCEED 4 TABLETS IN 24 HOURS) **IF DIARRHEA PERSISTS LONGER THAN 24 HOURS CALL M.D.    [provider]  meclizine (ANTIVERT) 25 MG tablet Take 1 tablet (25 mg total) by mouth 3 (three) times daily. 04/29/20   Nolberto Hanlon, MD  mycophenolate (CELLCEPT) 500 MG tablet Take 500-1,000 mg by mouth 2 (two) times daily. 04/14/20   [provider]  oxyCODONE (OXY IR/ROXICODONE) 5 MG immediate release tablet Take 1 tablet (5 mg total) by mouth every 4 (four) hours as needed for breakthrough pain. 04/22/20   Cherene Altes, MD  oxyCODONE (OXYCONTIN) 10 mg 12 hr tablet Take 1 tablet (10 mg total) by mouth every 12 (twelve) hours. 04/22/20   Cherene Altes, MD  pantoprazole (PROTONIX) 40 MG tablet Take 40 mg by mouth daily. 04/14/20   [provider]  predniSONE (DELTASONE) 20 MG tablet Take 20 mg by mouth daily. 04/14/20   [provider]  pyridostigmine (MESTINON) 60 MG tablet Take 60 mg by mouth 3 (three) times daily. 04/14/20   [provider]  timolol (TIMOPTIC) 0.25 % ophthalmic solution Apply 1 drop to eye 2 (two) times daily. 10/22/19   [provider]  traMADol (ULTRAM) 50 MG tablet Take 1 tablet (50 mg total) by mouth every 6 (six) hours as needed for severe pain. 04/22/20   Cherene Altes, MD    Physical Exam: Vitals:   06/04/20 7026 06/04/20 0924 06/04/20 1230  BP: (!) 167/89  132/74  Pulse: (!) 102  (!) 102  Resp: 18  16  Temp: 98.4 F (36.9 C)    TempSrc: Oral    SpO2: 98%  96%  Weight:  84.9 kg   Height:  _0  (1.702 m)    General: Not in acute distress HEENT:       Eyes: PERRL, EOMI, no scleral icterus.       ENT: No discharge from the ears and nose, no pharynx injection,  no tonsillar enlargement.        Neck: No JVD, no bruit, no mass felt. Heme: No neck lymph node enlargement. Cardiac: S1/S2, RRR, No murmurs, No gallops or rubs. Respiratory: No rales, wheezing, rhonchi or rubs. GI: Soft, nondistended, nontender, no rebound pain, no organomegaly, BS present. GU: No hematuria Ext: has 2+ pitting leg edema bilaterally. 1+DP/PT pulse bilaterally. Has tenderness, erythema, warmth, swelling in right forearm and wrist Musculoskeletal: No joint deformities, No joint redness or warmth, no limitation of ROM in spin. Skin: No rashes.  Neuro: Alert, oriented X3, cranial nerves II-XII grossly intact, moves all extremities  Psych: Patient is not psychotic, no suicidal or hemocidal ideation.  Labs on Admission: I have personally reviewed following labs and imaging studies  CBC: Recent Labs  Lab 06/04/20 0939  WBC 15.8*  NEUTROABS 12.9*  HGB 12.4*  HCT 39.5  MCV 97.1  PLT 786   Basic Metabolic Panel: Recent Labs  Lab 06/04/20 0939  NA 137  K 4.9  CL 100  CO2 28  GLUCOSE 118*  BUN 23  CREATININE 1.48*  CALCIUM 8.9   GFR: Estimated Creatinine Clearance: 29 mL/min (A) (by C-G formula based on SCr of 1.48 mg/dL (H)). Liver Function Tests: Recent Labs  Lab 06/04/20 0939  AST 48*  ALT 21  ALKPHOS 55  BILITOT 1.9*  PROT 6.8  ALBUMIN 3.4*   No results for input(s): LIPASE, AMYLASE in the last 168 hours. No results for input(s): AMMONIA in the last 168 hours. Coagulation Profile: No results for input(s): INR, PROTIME in the last 168 hours. Cardiac Enzymes: No results for input(s): CKTOTAL, CKMB, CKMBINDEX, TROPONINI in the last 168 hours. BNP (last 3 results) No results for input(s): PROBNP in the last 8760 hours. HbA1C: No results for input(s): HGBA1C in the last 72 hours. CBG: No results for input(s): GLUCAP in the last 168 hours. Lipid Profile: No results for input(s): CHOL, HDL, LDLCALC, TRIG, CHOLHDL, LDLDIRECT in the last 72  hours. Thyroid Function Tests: No results for input(s): TSH, T4TOTAL, FREET4, T3FREE, THYROIDAB in the last 72 hours. Anemia Panel: No results for input(s): VITAMINB12, FOLATE, FERRITIN, TIBC, IRON, RETICCTPCT in the last 72 hours. Urine analysis:    Component Value Date/Time   COLORURINE YELLOW (A) 04/28/2020 0103   APPEARANCEUR HAZY (A) 04/28/2020 0103   LABSPEC 1.028 04/28/2020 0103   PHURINE 5.0 04/28/2020 0103   GLUCOSEU NEGATIVE 04/28/2020 0103   HGBUR NEGATIVE 04/28/2020 0103   BILIRUBINUR NEGATIVE 04/28/2020 0103   KETONESUR NEGATIVE 04/28/2020 0103   PROTEINUR 30 (A) 04/28/2020 0103   NITRITE NEGATIVE 04/28/2020 0103   LEUKOCYTESUR NEGATIVE 04/28/2020 0103   Sepsis Labs: _0 (procalcitonin:4,lacticidven:4) )No results found for this or any previous visit (from the past 240 hour(s)).   Radiological Exams on Admission: CT Head Wo Contrast  Result Date: 06/04/2020 CLINICAL DATA:  Unwitnessed fall. EXAM: CT HEAD WITHOUT CONTRAST CT CERVICAL SPINE WITHOUT CONTRAST TECHNIQUE: Multidetector CT imaging of the head and cervical spine was performed following the standard protocol without intravenous contrast. Multiplanar CT image reconstructions of the cervical spine were also generated. COMPARISON:  CT head April 27, 2020 FINDINGS: CT HEAD FINDINGS Brain: No evidence of acute large vascular territory infarction, hemorrhage, hydrocephalus, extra-axial collection or mass lesion/mass effect. Similar generalized cerebral atrophy with ex vacuo ventricular dilation. Similar moderate patchy and confluent white matter hypoattenuation, most likely related to chronic microvascular ischemic disease. Similar mineralization along the falx and tentorial leaflets. Vascular: No hyperdense vessel identified. Intracranial and extracranial calcific atherosclerosis. Skull: No acute fracture. Sinuses/Orbits: Mild ethmoid air cell mucosal thickening without visible air-fluid level. No acute abnormality in  the visualized orbits. Other: Small left mastoid effusion. CT CERVICAL SPINE FINDINGS Alignment: Straightening of the normal cervical lordosis. Slight anterolisthesis of C2 on C3, favor degenerative given facet arthropathy at this level. Broad levocurvature. Skull base and vertebrae: Degenerative Schmorl's nodes involving the inferior T1 and T2 endplates. No evidence of acute fracture. Vertebral body heights are maintained. Diffuse osteopenia. Soft tissues and spinal canal: No prevertebral fluid or swelling. No visible canal hematoma. Disc levels: Severe degenerative disc disease at multiple levels with disc height loss, endplate sclerosis/cystic change, and posterior disc osteophyte complexes. Partial osseous fusion across the C5-C6 disc and across the C4-C5 right facet joint. Bulky multilevel facet/uncovertebral  hypertrophy with suspected severe foraminal stenosis on the right at C3-C4. Upper chest: No consolidation in the visualized lung apices. Other: Calcific atherosclerosis of the carotid arteries. IMPRESSION: CT head: 1. No evidence of acute intracranial abnormality. 2. Similar atrophy and chronic microvascular ischemic disease. CT cervical spine: 1. No evidence of acute fracture or traumatic malalignment the cervical spine. 2. Severe multilevel degenerative disc disease and bulky multilevel facet/uncovertebral hypertrophy with suspected severe foraminal stenosis on the right at C3-C4. MRI could better evaluate the canal/cord/foramina if clinically indicated. Electronically Signed   By: Margaretha Sheffield MD   On: 06/04/2020 10:29   CT Cervical Spine Wo Contrast  Result Date: 06/04/2020 CLINICAL DATA:  Unwitnessed fall. EXAM: CT HEAD WITHOUT CONTRAST CT CERVICAL SPINE WITHOUT CONTRAST TECHNIQUE: Multidetector CT imaging of the head and cervical spine was performed following the standard protocol without intravenous contrast. Multiplanar CT image reconstructions of the cervical spine were also generated.  COMPARISON:  CT head April 27, 2020 FINDINGS: CT HEAD FINDINGS Brain: No evidence of acute large vascular territory infarction, hemorrhage, hydrocephalus, extra-axial collection or mass lesion/mass effect. Similar generalized cerebral atrophy with ex vacuo ventricular dilation. Similar moderate patchy and confluent white matter hypoattenuation, most likely related to chronic microvascular ischemic disease. Similar mineralization along the falx and tentorial leaflets. Vascular: No hyperdense vessel identified. Intracranial and extracranial calcific atherosclerosis. Skull: No acute fracture. Sinuses/Orbits: Mild ethmoid air cell mucosal thickening without visible air-fluid level. No acute abnormality in the visualized orbits. Other: Small left mastoid effusion. CT CERVICAL SPINE FINDINGS Alignment: Straightening of the normal cervical lordosis. Slight anterolisthesis of C2 on C3, favor degenerative given facet arthropathy at this level. Broad levocurvature. Skull base and vertebrae: Degenerative Schmorl's nodes involving the inferior T1 and T2 endplates. No evidence of acute fracture. Vertebral body heights are maintained. Diffuse osteopenia. Soft tissues and spinal canal: No prevertebral fluid or swelling. No visible canal hematoma. Disc levels: Severe degenerative disc disease at multiple levels with disc height loss, endplate sclerosis/cystic change, and posterior disc osteophyte complexes. Partial osseous fusion across the C5-C6 disc and across the C4-C5 right facet joint. Bulky multilevel facet/uncovertebral hypertrophy with suspected severe foraminal stenosis on the right at C3-C4. Upper chest: No consolidation in the visualized lung apices. Other: Calcific atherosclerosis of the carotid arteries. IMPRESSION: CT head: 1. No evidence of acute intracranial abnormality. 2. Similar atrophy and chronic microvascular ischemic disease. CT cervical spine: 1. No evidence of acute fracture or traumatic malalignment the  cervical spine. 2. Severe multilevel degenerative disc disease and bulky multilevel facet/uncovertebral hypertrophy with suspected severe foraminal stenosis on the right at C3-C4. MRI could better evaluate the canal/cord/foramina if clinically indicated. Electronically Signed   By: Margaretha Sheffield MD   On: 06/04/2020 10:29   DG Chest Port 1 View  Result Date: 06/04/2020 CLINICAL DATA:  Unwitnessed fall EXAM: PORTABLE CHEST 1 VIEW COMPARISON:  Prior chest x-ray 04/27/2020 FINDINGS: Stable cardiac and mediastinal contours. Atherosclerotic calcification present in the transverse aorta. Stable appearance of the lungs with chronic bronchitic changes and mild interstitial prominence. No focal consolidation, pleural effusion or pneumothorax. No acute fracture identified. IMPRESSION: No active disease. Electronically Signed   By: Jacqulynn Cadet M.D.   On: 06/04/2020 11:20   DG Knee Complete 4 Views Right  Result Date: 06/04/2020 CLINICAL DATA:  Pain following fall EXAM: RIGHT KNEE - COMPLETE 4+ VIEW COMPARISON:  None. FINDINGS: Frontal, lateral, and bilateral oblique views were obtained. There is no fracture or dislocation. No evident joint effusion. There is  moderate joint space narrowing medially. There is slight joint space narrowing elsewhere. There is chondrocalcinosis. There are foci of arterial vascular calcification. IMPRESSION: No fracture or dislocation. No joint effusion. Osteoarthritic change, most notably medially. Chondrocalcinosis present, a finding that may be seen with osteoarthritis or calcium pyrophosphate deposition disease. Foci of atherosclerotic arterial vascular calcification noted. Electronically Signed   By: Lowella Grip III M.D.   On: 06/04/2020 10:34   DG Hip Unilat With Pelvis 2-3 Views Right  Result Date: 06/04/2020 CLINICAL DATA:  Pain following fall EXAM: DG HIP (WITH OR WITHOUT PELVIS) 2-3V RIGHT COMPARISON:  Pelvis CT April 19, 2020 FINDINGS: Frontal pelvis as well as  frontal and lateral right hip images obtained. 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 Bones are diffusely osteoporotic. There is no acute fracture or dislocation. There is mild narrowing of the left hip joint. There is postoperative change in the lower lumbar and upper sacral regions. Multifocal arterial vascular calcification noted. IMPRESSION: Total hip replacement on the right with prosthetic components well-seated. No fracture or dislocation. Underlying osteoporosis. Postoperative change lower lumbar spine and upper sacrum. Slight narrowing left hip joint. Atherosclerotic arterial vascular calcification present. Electronically Signed   By: Lowella Grip III M.D.   On: 06/04/2020 10:33     EKG:   Not done in ED, will get one.   Assessment/Plan Principal Problem:   Cellulitis of right upper arm Active Problems:   Myasthenia gravis (HCC)   Atrial fibrillation, chronic (HCC)   Hypothyroidism   Chronic pain syndrome   Fall   CKD (chronic kidney disease), stage IIIa   Sepsis (HCC)   HTN (hypertension)   GERD (gastroesophageal reflux disease)   COVID-19 virus infection   Sepsis due to cellulitis of right upper arm: Patient meets criteria for sepsis with leukocytosis with WBC 15.8, tachycardia with heart rate of 102.  Lactic acid is normal.  Currently hemodynamically stable. Right UE doppler  Is negative for DVT.  - Admitted to [facility] bed as inpatient - Empiric antimicrobial treatment with vancomycin and Rocephin (patient received 1 dose of cefepime by ED) - Blood cultures x 2  - ESR and CRP - will get Procalcitonin - IVF: 500 cc of of NS bolus in ED,  Myasthenia gravis [facility]): Patient is taking Mestinon and prednisone 20 mg daily -start Solu-Cortef 50 mg every 8 hour -Hold prednisone -Continue Mestinon  Atrial fibrillation, chronic ([place]): Patient is not taking anticoagulants.  Heart rate 102 which is likely due to  sepsis -Cardiac monitoring  Hypothyroidism -Synthroid  Chronic pain syndrome -As needed oxycodone and Tylenol  Fall: CT head negative.  CT of C-spine is negative for acute issues.  X-ray of right hip and right knee negative for acute bony fracture. -PT/OT  CKD (chronic kidney disease), stage IIIa: Recent baseline creatinine 1.1-1.4.  His creatinine is 1.48, BUN 23, close to baseline. -Follow-up with BMP  HTN (hypertension) -IV hydralazine as needed -Hold Lasix due to sepsis  GERD (gastroesophageal reflux disease) -Protonix  Possible COVID-19 virus infection: Daughter states the patient had a positive Covid test yesterday.  Patient has a mild dry cough and mild shortness of breath.  No oxygen desaturation.  Chest x-ray negative. -As needed albuterol and Mucinex     DVT ppx:  SQ Lovenox Code Status: DNR per his daguhter Family Communication: Yes, patient's daughtger   at bed side Disposition Plan:  Anticipate discharge back to previous environment Consults called:  none Admission status  and Level of care: Med-Surg:   as inpt     Status is: Inpatient  Remains inpatient appropriate because:Inpatient level of care appropriate due to severity of illness   Dispo: The patient is from: ALF              Anticipated d/c is to: ALF              Patient currently is not medically stable to d/c.   Difficult to place patient No        Date of Service 06/04/2020    [facility]   If 7PM-7AM, please contact night-coverage [email or web address] 06/04/2020, 1:03 PM

## 2020-06-04 NOTE — ED Provider Notes (Signed)
[facility] Emergency Department Provider Note   ____________________________________________   Event Date/Time   First MD Initiated Contact with Patient 06/04/20 [phone]     (approximate)  I have reviewed the triage vital signs and the nursing notes.   HISTORY  Chief Complaint Leg Injury    HPI Gregory Small is a 87 y.o. male with a past medical history of hypertension, hypothyroidism, myasthenia gravis who presents via EMS after an unwitnessed fall from standing complaining of right knee pain.  Per EMS, patient had an obvious deformity of the right knee in which there was significant valgus angulation at the knee that corrected itself after patient was picked up from the ground and placed on the stretcher.  Patient continues to complain of severe pain at the right knee and right hip and is unable to move at either joint.  Patient does not recall whether he hit his head or lost consciousness.  Patient currently denies any vision changes, tinnitus, difficulty speaking, facial droop, sore throat, chest pain, shortness of breath, abdominal pain, nausea/vomiting/diarrhea, dysuria, or weakness/numbness/paresthesias in any extremity         Past Medical History:  Diagnosis Date  . Hypertension   . Hypothyroidism   . Myasthenia gravis [facility])     Patient Active Problem List   Diagnosis Date Noted  . Fall 06/04/2020  . Cellulitis of right upper arm 06/04/2020  . CKD (chronic kidney disease), stage IIIa 06/04/2020  . Sepsis (HCC) 06/04/2020  . HTN (hypertension) 06/04/2020  . GERD (gastroesophageal reflux disease) 06/04/2020  . COVID-19 virus infection 06/04/2020  . Vertigo 04/28/2020  . Acute kidney injury (HCC) 04/21/2020  . Left knee pain 04/20/2020  . Chronic pain syndrome 04/20/2020  . Left hip pain 04/19/2020  . Myasthenia gravis (HCC) 04/19/2020  . Atrial fibrillation, chronic (HCC) 04/19/2020  . Hypothyroidism 04/19/2020    Past Surgical  History:  Procedure Laterality Date  . BACK SURGERY      Prior to Admission medications   Medication Sig Start Date End Date Taking? Authorizing Provider  acetaminophen (TYLENOL) 500 MG tablet Take 500-1,000 mg by mouth every 8 (eight) hours as needed for pain. 12/24/19  Yes [provider]  brimonidine (ALPHAGAN) 0.2 % ophthalmic solution Place 1 drop into both eyes 2 (two) times daily. 04/12/20  Yes [provider]  calcium carbonate (OSCAL) 1500 (600 Ca) MG TABS tablet Take 600 mg of elemental calcium by mouth daily with breakfast.   Yes [provider]  Calcium Carbonate-Vit D-Min (CALTRATE 600+D PLUS MINERALS) 600-800 MG-UNIT TABS Take 1 tablet by mouth daily.   Yes [provider]  furosemide (LASIX) 20 MG tablet Take 20 mg by mouth daily. 05/16/20  Yes [provider]  levothyroxine (SYNTHROID) 88 MCG tablet Take 88 mcg by mouth daily before breakfast. 11/12/19  Yes [provider]  meclizine (ANTIVERT) 25 MG tablet Take 1 tablet (25 mg total) by mouth 3 (three) times daily. 04/29/20  Yes Lynn Ito, MD  oxyCODONE (OXY IR/ROXICODONE) 5 MG immediate release tablet Take 1 tablet (5 mg total) by mouth every 4 (four) hours as needed for breakthrough pain. 04/22/20  Yes Lonia Blood, MD  pantoprazole (PROTONIX) 40 MG tablet Take 40 mg by mouth daily. 04/14/20  Yes [provider]  predniSONE (DELTASONE) 20 MG tablet Take 20 mg by mouth daily. 04/14/20  Yes [provider]  pyridostigmine (MESTINON) 60 MG tablet Take 60 mg by mouth 3 (three) times daily. 04/14/20  Yes [provider]  timolol (TIMOPTIC) 0.25 % ophthalmic solution Apply 1 drop to eye 2 (two) times daily. 10/22/19  Yes [provider]  albuterol (VENTOLIN HFA) 108 (90 Base) MCG/ACT inhaler Inhale 1-2 puffs into the lungs every 6 (six) hours as needed for wheezing or shortness of breath. 04/15/20   [provider]  loperamide (IMODIUM  A-D) 2 MG tablet Take 2-4 mg by mouth 4 (four) times daily as needed for diarrhea or loose stools. TAKE TWO TABLETS BY MOUTH AFTER 1ST LOOSE STOOL THEN 1 TABLET BY MOUTH AFTER EACH SUBSEQUENT LOOSE STOOL. ( DO NOT EXCEED 4 TABLETS IN 24 HOURS) **IF DIARRHEA PERSISTS LONGER THAN 24 HOURS CALL M.D.    [provider]  mycophenolate (CELLCEPT) 500 MG tablet Take 500-1,000 mg by mouth 2 (two) times daily. Patient not taking: Reported on 06/04/2020 04/14/20   [provider]  oxyCODONE (OXYCONTIN) 10 mg 12 hr tablet Take 1 tablet (10 mg total) by mouth every 12 (twelve) hours. Patient not taking: Reported on 06/04/2020 04/22/20   Lonia Blood, MD  traMADol (ULTRAM) 50 MG tablet Take 1 tablet (50 mg total) by mouth every 6 (six) hours as needed for severe pain. Patient not taking: Reported on 06/04/2020 04/22/20   Lonia Blood, MD    Allergies Simvastatin and Sulfa antibiotics  Family History  Problem Relation Age of Onset  . Cataracts Daughter     Social History Social History   Tobacco Use  . Smoking status: Never Smoker  Vaping Use  . Vaping Use: Never used  Substance Use Topics  . Alcohol use: Not Currently  . Drug use: Never    Review of Systems Constitutional: No fever/chills Eyes: No visual changes. ENT: No sore throat. Cardiovascular: Denies chest pain. Respiratory: Denies shortness of breath. Gastrointestinal: No abdominal pain.  No nausea, no vomiting.  No diarrhea. Genitourinary: Negative for dysuria. Musculoskeletal: Endorses acute right knee and hip pain Skin: Negative for rash. Neurological: Negative for headaches, weakness/numbness/paresthesias in any extremity Psychiatric: Negative for suicidal ideation/homicidal ideation   ____________________________________________   PHYSICAL EXAM:  VITAL SIGNS: ED Triage Vitals  Enc Vitals Group     BP      Pulse      Resp      Temp      Temp src      SpO2      Weight      Height      Head  Circumference      Peak Flow      Pain Score      Pain Loc      Pain Edu?      Excl. in GC?    Constitutional: Alert and oriented. Well appearing and in no acute distress. Eyes: Conjunctivae are normal. PERRL. Head: Atraumatic. Nose: No congestion/rhinnorhea. Mouth/Throat: Mucous membranes are moist. Neck: No stridor Cardiovascular: Grossly normal heart sounds.  Good peripheral circulation. Respiratory: Normal respiratory effort.  No retractions. Gastrointestinal: Soft and nontender. No distention. Musculoskeletal: No obvious deformities.  Tenderness to palpation at the right knee as well as limited range of motion secondary to pain Neurologic:  Normal speech and language. No gross focal neurologic deficits are appreciated. Skin:  Skin is warm and dry. No rash noted. Psychiatric: Mood and affect are normal. Speech and behavior are normal.  ____________________________________________   LABS (all labs ordered are listed, but only abnormal results are displayed)  Labs Reviewed  COMPREHENSIVE METABOLIC PANEL - Abnormal; Notable for the  following components:      Result Value   Glucose, Bld 118 (*)    Creatinine, Ser 1.48 (*)    Albumin 3.4 (*)    AST 48 (*)    Total Bilirubin 1.9 (*)    GFR, Estimated 42 (*)    All other components within normal limits  CBC WITH DIFFERENTIAL/PLATELET - Abnormal; Notable for the following components:   WBC 15.8 (*)    RBC 4.07 (*)    Hemoglobin 12.4 (*)    RDW 16.4 (*)    Neutro Abs 12.9 (*)    Monocytes Absolute 1.6 (*)    Abs Immature Granulocytes 0.11 (*)    All other components within normal limits  SEDIMENTATION RATE - Abnormal; Notable for the following components:   Sed Rate 54 (*)    All other components within normal limits  BRAIN NATRIURETIC PEPTIDE - Abnormal; Notable for the following components:   B Natriuretic Peptide 192.6 (*)    All other components within normal limits  SARS CORONAVIRUS 2 (TAT 6-24 HRS)  CULTURE, BLOOD  (ROUTINE X 2)  LACTIC ACID, PLASMA  PROCALCITONIN  C-REACTIVE PROTEIN   ____________________________________________   RADIOLOGY  ED MD interpretation: CT of the head and cervical spine without contrast shows no evidence of acute abnormalities in including no acute intracranial hemorrhage, fractures, or dislocations  X-ray of the right knee and hip also shows no evidence of acute abnormalities.  There is evidence of patient's prior hip replacement with hardware intact  Official radiology report(s): CT Head Wo Contrast  Result Date: 06/04/2020 CLINICAL DATA:  Unwitnessed fall. EXAM: CT HEAD WITHOUT CONTRAST CT CERVICAL SPINE WITHOUT CONTRAST TECHNIQUE: Multidetector CT imaging of the head and cervical spine was performed following the standard protocol without intravenous contrast. Multiplanar CT image reconstructions of the cervical spine were also generated. COMPARISON:  CT head April 27, 2020 FINDINGS: CT HEAD FINDINGS Brain: No evidence of acute large vascular territory infarction, hemorrhage, hydrocephalus, extra-axial collection or mass lesion/mass effect. Similar generalized cerebral atrophy with ex vacuo ventricular dilation. Similar moderate patchy and confluent white matter hypoattenuation, most likely related to chronic microvascular ischemic disease. Similar mineralization along the falx and tentorial leaflets. Vascular: No hyperdense vessel identified. Intracranial and extracranial calcific atherosclerosis. Skull: No acute fracture. Sinuses/Orbits: Mild ethmoid air cell mucosal thickening without visible air-fluid level. No acute abnormality in the visualized orbits. Other: Small left mastoid effusion. CT CERVICAL SPINE FINDINGS Alignment: Straightening of the normal cervical lordosis. Slight anterolisthesis of C2 on C3, favor degenerative given facet arthropathy at this level. Broad levocurvature. Skull base and vertebrae: Degenerative Schmorl's nodes involving the inferior T1 and T2  endplates. No evidence of acute fracture. Vertebral body heights are maintained. Diffuse osteopenia. Soft tissues and spinal canal: No prevertebral fluid or swelling. No visible canal hematoma. Disc levels: Severe degenerative disc disease at multiple levels with disc height loss, endplate sclerosis/cystic change, and posterior disc osteophyte complexes. Partial osseous fusion across the C5-C6 disc and across the C4-C5 right facet joint. Bulky multilevel facet/uncovertebral hypertrophy with suspected severe foraminal stenosis on the right at C3-C4. Upper chest: No consolidation in the visualized lung apices. Other: Calcific atherosclerosis of the carotid arteries. IMPRESSION: CT head: 1. No evidence of acute intracranial abnormality. 2. Similar atrophy and chronic microvascular ischemic disease. CT cervical spine: 1. No evidence of acute fracture or traumatic malalignment the cervical spine. 2. Severe multilevel degenerative disc disease and bulky multilevel facet/uncovertebral hypertrophy with suspected severe foraminal stenosis on the right at C3-C4.  MRI could better evaluate the canal/cord/foramina if clinically indicated. Electronically Signed   By: Feliberto HartsFrederick S Jones MD   On: 06/04/2020 10:29   CT Cervical Spine Wo Contrast  Result Date: 06/04/2020 CLINICAL DATA:  Unwitnessed fall. EXAM: CT HEAD WITHOUT CONTRAST CT CERVICAL SPINE WITHOUT CONTRAST TECHNIQUE: Multidetector CT imaging of the head and cervical spine was performed following the standard protocol without intravenous contrast. Multiplanar CT image reconstructions of the cervical spine were also generated. COMPARISON:  CT head April 27, 2020 FINDINGS: CT HEAD FINDINGS Brain: No evidence of acute large vascular territory infarction, hemorrhage, hydrocephalus, extra-axial collection or mass lesion/mass effect. Similar generalized cerebral atrophy with ex vacuo ventricular dilation. Similar moderate patchy and confluent white matter hypoattenuation, most  likely related to chronic microvascular ischemic disease. Similar mineralization along the falx and tentorial leaflets. Vascular: No hyperdense vessel identified. Intracranial and extracranial calcific atherosclerosis. Skull: No acute fracture. Sinuses/Orbits: Mild ethmoid air cell mucosal thickening without visible air-fluid level. No acute abnormality in the visualized orbits. Other: Small left mastoid effusion. CT CERVICAL SPINE FINDINGS Alignment: Straightening of the normal cervical lordosis. Slight anterolisthesis of C2 on C3, favor degenerative given facet arthropathy at this level. Broad levocurvature. Skull base and vertebrae: Degenerative Schmorl's nodes involving the inferior T1 and T2 endplates. No evidence of acute fracture. Vertebral body heights are maintained. Diffuse osteopenia. Soft tissues and spinal canal: No prevertebral fluid or swelling. No visible canal hematoma. Disc levels: Severe degenerative disc disease at multiple levels with disc height loss, endplate sclerosis/cystic change, and posterior disc osteophyte complexes. Partial osseous fusion across the C5-C6 disc and across the C4-C5 right facet joint. Bulky multilevel facet/uncovertebral hypertrophy with suspected severe foraminal stenosis on the right at C3-C4. Upper chest: No consolidation in the visualized lung apices. Other: Calcific atherosclerosis of the carotid arteries. IMPRESSION: CT head: 1. No evidence of acute intracranial abnormality. 2. Similar atrophy and chronic microvascular ischemic disease. CT cervical spine: 1. No evidence of acute fracture or traumatic malalignment the cervical spine. 2. Severe multilevel degenerative disc disease and bulky multilevel facet/uncovertebral hypertrophy with suspected severe foraminal stenosis on the right at C3-C4. MRI could better evaluate the canal/cord/foramina if clinically indicated. Electronically Signed   By: Feliberto HartsFrederick S Jones MD   On: 06/04/2020 10:29   US Venous Img Upper Uni  Right(DVT)  Result Date: 06/04/2020 CLINICAL DATA:  Right upper extremity cellulitis EXAM: RIGHT UPPER EXTREMITY VENOUS DOPPLER ULTRASOUND TECHNIQUE: Gray-scale sonography with graded compression, as well as color Doppler and duplex ultrasound were performed to evaluate the upper extremity deep venous system from the level of the subclavian vein and including the jugular, axillary, basilic, radial, ulnar and upper cephalic vein. Spectral Doppler was utilized to evaluate flow at rest and with distal augmentation maneuvers. COMPARISON:  None. FINDINGS: Contralateral Subclavian Vein: Respiratory phasicity is normal and symmetric with the symptomatic side. No evidence of thrombus. Normal compressibility. Internal Jugular Vein: No evidence of thrombus. Normal compressibility, respiratory phasicity and response to augmentation. Subclavian Vein: No evidence of thrombus. Normal compressibility, respiratory phasicity and response to augmentation. Axillary Vein: No evidence of thrombus. Normal compressibility, respiratory phasicity and response to augmentation. Cephalic Vein: No evidence of thrombus. Normal compressibility, respiratory phasicity and response to augmentation. Basilic Vein: No evidence of thrombus. Normal compressibility, respiratory phasicity and response to augmentation. Brachial Veins: No evidence of thrombus. Normal compressibility, respiratory phasicity and response to augmentation. Radial Veins: No evidence of thrombus. Normal compressibility, respiratory phasicity and response to augmentation. Ulnar Veins: No evidence of thrombus. Normal  compressibility, respiratory phasicity and response to augmentation. Venous Reflux:  None visualized. Other Findings:  Soft tissue edema about the forearm. IMPRESSION: 1.  No evidence of DVT within the right upper extremity. 2.  Soft tissue edema about the forearm. Electronically Signed   By: Lauralyn Primes M.D.   On: 06/04/2020 14:22   DG Chest Port 1 View  Result  Date: 06/04/2020 CLINICAL DATA:  Unwitnessed fall EXAM: PORTABLE CHEST 1 VIEW COMPARISON:  Prior chest x-ray 04/27/2020 FINDINGS: Stable cardiac and mediastinal contours. Atherosclerotic calcification present in the transverse aorta. Stable appearance of the lungs with chronic bronchitic changes and mild interstitial prominence. No focal consolidation, pleural effusion or pneumothorax. No acute fracture identified. IMPRESSION: No active disease. Electronically Signed   By: Malachy Moan M.D.   On: 06/04/2020 11:20   DG Knee Complete 4 Views Right  Result Date: 06/04/2020 CLINICAL DATA:  Pain following fall EXAM: RIGHT KNEE - COMPLETE 4+ VIEW COMPARISON:  None. FINDINGS: Frontal, lateral, and bilateral oblique views were obtained. There is no fracture or dislocation. No evident joint effusion. There is moderate joint space narrowing medially. There is slight joint space narrowing elsewhere. There is chondrocalcinosis. There are foci of arterial vascular calcification. IMPRESSION: No fracture or dislocation. No joint effusion. Osteoarthritic change, most notably medially. Chondrocalcinosis present, a finding that may be seen with osteoarthritis or calcium pyrophosphate deposition disease. Foci of atherosclerotic arterial vascular calcification noted. Electronically Signed   By: Bretta Bang III M.D.   On: 06/04/2020 10:34   DG Hip Unilat With Pelvis 2-3 Views Right  Result Date: 06/04/2020 CLINICAL DATA:  Pain following fall EXAM: DG HIP (WITH OR WITHOUT PELVIS) 2-3V RIGHT COMPARISON:  Pelvis CT April 19, 2020 FINDINGS: Frontal pelvis as well as frontal and lateral right hip images obtained. 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 Bones are diffusely osteoporotic. There is no acute fracture or dislocation. There is mild narrowing of the left hip joint. There is postoperative change in the lower lumbar and upper sacral regions. Multifocal  arterial vascular calcification noted. IMPRESSION: Total hip replacement on the right with prosthetic components well-seated. No fracture or dislocation. Underlying osteoporosis. Postoperative change lower lumbar spine and upper sacrum. Slight narrowing left hip joint. Atherosclerotic arterial vascular calcification present. Electronically Signed   By: Bretta Bang III M.D.   On: 06/04/2020 10:33    ____________________________________________   PROCEDURES  Procedure(s) performed (including Critical Care):  .1-3 Lead EKG Interpretation Performed by: Merwyn Katos, MD Authorized by: Merwyn Katos, MD     Interpretation: normal     ECG rate:  82   ECG rate assessment: normal     Rhythm: sinus rhythm     Ectopy: none     Conduction: normal       ____________________________________________   INITIAL IMPRESSION / ASSESSMENT AND PLAN / ED COURSE  As part of my medical decision making, I reviewed the following data within the electronic MEDICAL RECORD NUMBER Nursing notes reviewed and incorporated, Labs reviewed, EKG interpreted, Old chart reviewed, Radiograph reviewed and Notes from prior ED visits reviewed and incorporated    Patient presents with complaints of syncope/presyncope ED Workup:  CBC, BMP, Troponin, BNP, ECG, CXR Differential diagnosis includes HF, ICH, seizure, stroke, HOCM, ACS, aortic dissection, malignant arrhythmia, or GI bleed. Findings: No evidence of acute laboratory abnormalities.  Troponin negative x1 EKG: No e/o STEMI. No evidence of Brugadas sign, delta wave, epsilon wave, significantly prolonged QTc,  or malignant arrhythmia. Patient also showed signs of significant cellulitis to the right upper extremity that require further antibiotics. Disposition: Admit to medicine, telemetry bed for cardiac monitoring and cardiology review.     ____________________________________________   FINAL CLINICAL IMPRESSION(S) / ED DIAGNOSES  Final diagnoses:   Cough  Cellulitis of right upper extremity  Fall, initial encounter     ED Discharge Orders    None       Note:  This document was prepared using Dragon voice recognition software and may include unintentional dictation errors.   Merwyn Katos, MD 06/04/20 [phone]

## 2020-06-05 ENCOUNTER — Inpatient Hospital Stay: Payer: Medicare Other

## 2020-06-05 DIAGNOSIS — L03113 Cellulitis of right upper limb: Secondary | ICD-10-CM | POA: Diagnosis not present

## 2020-06-05 DIAGNOSIS — U071 COVID-19: Secondary | ICD-10-CM | POA: Diagnosis not present

## 2020-06-05 DIAGNOSIS — A4101 Sepsis due to Methicillin susceptible Staphylococcus aureus: Secondary | ICD-10-CM

## 2020-06-05 LAB — CBC
HCT: 35.3 % — ABNORMAL LOW (ref 39.0–52.0)
Hemoglobin: 11.2 g/dL — ABNORMAL LOW (ref 13.0–17.0)
MCH: 30.9 pg (ref 26.0–34.0)
MCHC: 31.7 g/dL (ref 30.0–36.0)
MCV: 97.2 fL (ref 80.0–100.0)
Platelets: 169 10*3/uL (ref 150–400)
RBC: 3.63 MIL/uL — ABNORMAL LOW (ref 4.22–5.81)
RDW: 16.5 % — ABNORMAL HIGH (ref 11.5–15.5)
WBC: 13.8 10*3/uL — ABNORMAL HIGH (ref 4.0–10.5)
nRBC: 0 % (ref 0.0–0.2)

## 2020-06-05 LAB — BLOOD CULTURE ID PANEL (REFLEXED) - BCID2

## 2020-06-05 LAB — BASIC METABOLIC PANEL
Anion gap: 8 (ref 5–15)
BUN: 23 mg/dL (ref 8–23)
CO2: 26 mmol/L (ref 22–32)
Calcium: 8.5 mg/dL — ABNORMAL LOW (ref 8.9–10.3)
Chloride: 104 mmol/L (ref 98–111)
Creatinine, Ser: 1.03 mg/dL (ref 0.61–1.24)
GFR, Estimated: >60 mL/min (ref >60)
Glucose, Bld: 92 mg/dL (ref 70–99)
Potassium: 3.6 mmol/L (ref 3.5–5.1)
Sodium: 138 mmol/L (ref 135–145)

## 2020-06-05 MED ORDER — ENOXAPARIN SODIUM 40 MG/0.4ML ~~LOC~~ SOLN
40.0000 mg | SUBCUTANEOUS | Status: DC
  Administered 2020-06-05 – 2020-06-13 (×9): 40 mg via SUBCUTANEOUS
  Filled 2020-06-05 (×9): qty 0.4

## 2020-06-05 MED ORDER — CEFAZOLIN SODIUM-DEXTROSE 2-4 GM/100ML-% IV SOLN
2.0000 g | Freq: Three times a day (TID) | INTRAVENOUS | Status: DC
  Administered 2020-06-05 – 2020-06-07 (×7): 2 g via INTRAVENOUS
  Filled 2020-06-05 (×11): qty 100

## 2020-06-05 NOTE — Progress Notes (Incomplete)
Physical Therapy Evaluation Patient Details Name: Gregory Small MRN: [id number] DOB: 1920/01/10 Today's Date: 06/05/2020   History of Present Illness  87 yo male with onset of RUE cellulitis from unknown source was brought to ED, after a fall at his ALF which was unwitnessed.  Pt is having some joint pain with no diagnosed injuries.  Noted sepsis, current Covid infection, falls.  PMHx:  recent Covid dx, myasthenia gravis, vertigo, a-fib, GERD, hypothyroidism, HTN, chronic pain, spinal surgery with DJD Lumbar spine, falls, CKD 3a  Clinical Impression  Pt was seen on side of bed to work on STS and could only do lateral scoot transfers with mod assist.  Focus on his ability to use LE strength, monitor HR and sats, and work on endurance standing and quality of gait.  Follow for acute PT goals.    Follow Up Recommendations      Equipment Recommendations       Recommendations for Other Services       Precautions / Restrictions Precautions Precautions: Fall Precaution Comments: monitor HR and sats Restrictions Weight Bearing Restrictions: No      Mobility  Bed Mobility               General bed mobility comments: deferred. MOD A per PT note    Transfers                 General transfer comment: pt politely declines citing fatigue  Ambulation/Gait                Stairs            Wheelchair Mobility    Modified Rankin (Stroke Patients Only)       Balance                                             Pertinent Vitals/Pain Pain Assessment: Faces Faces Pain Scale: Hurts little more Pain Location: R UE Pain Descriptors / Indicators: Guarding Pain Intervention(s): Limited activity within patient's tolerance;Monitored during session    Home Living Family/patient expects to be discharged to:: Assisted living               Home Equipment: Walker - 4 wheels;Shower seat Additional Comments: pt was walking without assist  in ALF    Prior Function Level of Independence: Independent with assistive device(s);Needs assistance   Gait / Transfers Assistance Needed: I with rollator  ADL's / Homemaking Assistance Needed: facility gives pt his meds, provides meals and assists with personal care        Hand Dominance   Dominant Hand: Right    Extremity/Trunk Assessment   Upper Extremity Assessment Upper Extremity Assessment: Generalized weakness;RUE deficits/detail RUE Deficits / Details: decreased ROM tolerance 2/2 pain from cellulitis RUE: Unable to fully assess due to pain    Lower Extremity Assessment Lower Extremity Assessment: Generalized weakness       Communication   Communication: No difficulties  Cognition Arousal/Alertness: Awake/alert Behavior During Therapy: WFL for tasks assessed/performed Overall Cognitive Status: No family/caregiver present to determine baseline cognitive functioning                                 General Comments: Pt oriented to self only. Pleasantly confused and able to follow all commands. HOH  General Comments      Exercises     Assessment/Plan    PT Assessment    PT Problem List         PT Treatment Interventions      PT Goals (Current goals can be found in the Care Plan section)  Acute Rehab PT Goals Patient Stated Goal: to hurt less and get better    Frequency     Barriers to discharge        Co-evaluation               AM-PAC PT "6 Clicks" Mobility  Outcome Measure                  End of Session              Time:  -      Charges:              Ivar Drape 06/05/2020, 9:01 PM  Samul Dada, PT MS Acute Rehab Dept. Number: [facility] [id number] and [facility] [phone]

## 2020-06-05 NOTE — Consult Note (Signed)
Pharmacy Antibiotic Note  Gregory Small is a 87 y.o. male admitted on 06/04/2020 with cellulitis. Patient initiated on broad spectrum coverage with vancomycin and ceftriaxone. Blood cultures have now resulted GPC in 1/4 bottles to date. BCID detected Staphylococcus aureus. No resistance genes detected. Pharmacy has been consulted for cefazolin dosing. ID has been consulted.  Plan:  Cefazolin 2 g IV q8h  Continue to monitor renal function and adjust antibiotics as indicated. Continue to follow-up cultures.  Height: 5\' 7"  (170.2 cm) Weight: 84.9 kg (187 lb 1.6 oz) IBW/kg (Calculated) : 66.1  Temp (24hrs), Avg:97.6 F (36.4 C), Min:97.1 F (36.2 C), Max:98.1 F (36.7 C)  Recent Labs  Lab 06/04/20 0939 06/04/20 1129 06/05/20 0524  WBC 15.8*  --  13.8*  CREATININE 1.48*  --  1.03  LATICACIDVEN  --  1.5  --     Estimated Creatinine Clearance: 41.7 mL/min (by C-G formula based on SCr of 1.03 mg/dL).    Allergies  Allergen Reactions  . Simvastatin Other (See Comments)    Myalgias   . Sulfa Antibiotics     Antimicrobials this admission: Cefepime 4/8 x 1 Vancomycin 4/8 x 1 Ceftriaxone 4/9 x 1 Cefazolin 4/9 >>  Dose adjustments this admission: n/a  Microbiology results: 4/8 BCx: 1/4 bottles GPC. BCID detected SA no resistance genes 4/8 SARS-CoV-2: (+)  Thank you for allowing pharmacy to be a part of this patient's care.  6/8 06/05/2020 9:52 AM

## 2020-06-05 NOTE — Consult Note (Signed)
ORTHOPAEDIC CONSULTATION  PATIENT NAME: Gregory Small DOB: Feb 25, 1920  MRN: [id number]  REQUESTING PHYSICIAN: Marrion Coy, MD  Chief Complaint: R hand pain  HPI: Gregory Small is a 87 y.o. male who complains of  Right hand pain for a few weeks. Denies any preceding trauma. No known skin breaks/risk factors for wounds. Pt admitted overnight and has not had 24 hrs of antibiotics.   Past Medical History:  Diagnosis Date  . Hypertension   . Hypothyroidism   . Myasthenia gravis [facility])    Past Surgical History:  Procedure Laterality Date  . BACK SURGERY     Social History   Socioeconomic History  . Marital status: Widowed    Spouse name: Not on file  . Number of children: Not on file  . Years of education: Not on file  . Highest education level: Not on file  Occupational History  . Not on file  Tobacco Use  . Smoking status: Never Smoker  . Smokeless tobacco: Not on file  Vaping Use  . Vaping Use: Never used  Substance and Sexual Activity  . Alcohol use: Not Currently  . Drug use: Never  . Sexual activity: Not Currently  Other Topics Concern  . Not on file  Social History Narrative  . Not on file   Social Determinants of Health   Financial Resource Strain: Not on file  Food Insecurity: Not on file  Transportation Needs: Not on file  Physical Activity: Not on file  Stress: Not on file  Social Connections: Not on file   Family History  Problem Relation Age of Onset  . Cataracts Daughter    Allergies  Allergen Reactions  . Simvastatin Other (See Comments)    Myalgias   . Sulfa Antibiotics    Prior to Admission medications   Medication Sig Start Date End Date Taking? Authorizing Provider  acetaminophen (TYLENOL) 500 MG tablet Take 500-1,000 mg by mouth every 8 (eight) hours as needed for pain. 12/24/19  Yes [provider]  brimonidine (ALPHAGAN) 0.2 % ophthalmic solution Place 1 drop into both eyes 2 (two) times daily. 04/12/20  Yes  [provider]  calcium carbonate (OSCAL) 1500 (600 Ca) MG TABS tablet Take 600 mg of elemental calcium by mouth daily with breakfast.   Yes [provider]  Calcium Carbonate-Vit D-Min (CALTRATE 600+D PLUS MINERALS) 600-800 MG-UNIT TABS Take 1 tablet by mouth daily.   Yes [provider]  furosemide (LASIX) 20 MG tablet Take 20 mg by mouth daily. 05/16/20  Yes [provider]  levothyroxine (SYNTHROID) 88 MCG tablet Take 88 mcg by mouth daily before breakfast. 11/12/19  Yes [provider]  meclizine (ANTIVERT) 25 MG tablet Take 1 tablet (25 mg total) by mouth 3 (three) times daily. 04/29/20  Yes Lynn Ito, MD  oxyCODONE (OXY IR/ROXICODONE) 5 MG immediate release tablet Take 1 tablet (5 mg total) by mouth every 4 (four) hours as needed for breakthrough pain. 04/22/20  Yes Lonia Blood, MD  pantoprazole (PROTONIX) 40 MG tablet Take 40 mg by mouth daily. 04/14/20  Yes [provider]  predniSONE (DELTASONE) 20 MG tablet Take 20 mg by mouth daily. 04/14/20  Yes [provider]  pyridostigmine (MESTINON) 60 MG tablet Take 60 mg by mouth 3 (three) times daily. 04/14/20  Yes [provider]  timolol (TIMOPTIC) 0.25 % ophthalmic solution Apply 1 drop to eye 2 (two) times daily. 10/22/19  Yes [provider]  albuterol (VENTOLIN HFA) 108 (90 Base)  MCG/ACT inhaler Inhale 1-2 puffs into the lungs every 6 (six) hours as needed for wheezing or shortness of breath. 04/15/20   [provider]  loperamide (IMODIUM A-D) 2 MG tablet Take 2-4 mg by mouth 4 (four) times daily as needed for diarrhea or loose stools. TAKE TWO TABLETS BY MOUTH AFTER 1ST LOOSE STOOL THEN 1 TABLET BY MOUTH AFTER EACH SUBSEQUENT LOOSE STOOL. ( DO NOT EXCEED 4 TABLETS IN 24 HOURS) **IF DIARRHEA PERSISTS LONGER THAN 24 HOURS CALL M.D.    [provider]  mycophenolate (CELLCEPT) 500 MG tablet Take 500-1,000 mg by mouth 2 (two) times daily. Patient  not taking: Reported on 06/04/2020 04/14/20   [provider]  oxyCODONE (OXYCONTIN) 10 mg 12 hr tablet Take 1 tablet (10 mg total) by mouth every 12 (twelve) hours. Patient not taking: Reported on 06/04/2020 04/22/20   Lonia Blood, MD  traMADol (ULTRAM) 50 MG tablet Take 1 tablet (50 mg total) by mouth every 6 (six) hours as needed for severe pain. Patient not taking: Reported on 06/04/2020 04/22/20   Lonia Blood, MD   CT Head Wo Contrast  Result Date: 06/04/2020 CLINICAL DATA:  Unwitnessed fall. EXAM: CT HEAD WITHOUT CONTRAST CT CERVICAL SPINE WITHOUT CONTRAST TECHNIQUE: Multidetector CT imaging of the head and cervical spine was performed following the standard protocol without intravenous contrast. Multiplanar CT image reconstructions of the cervical spine were also generated. COMPARISON:  CT head April 27, 2020 FINDINGS: CT HEAD FINDINGS Brain: No evidence of acute large vascular territory infarction, hemorrhage, hydrocephalus, extra-axial collection or mass lesion/mass effect. Similar generalized cerebral atrophy with ex vacuo ventricular dilation. Similar moderate patchy and confluent white matter hypoattenuation, most likely related to chronic microvascular ischemic disease. Similar mineralization along the falx and tentorial leaflets. Vascular: No hyperdense vessel identified. Intracranial and extracranial calcific atherosclerosis. Skull: No acute fracture. Sinuses/Orbits: Mild ethmoid air cell mucosal thickening without visible air-fluid level. No acute abnormality in the visualized orbits. Other: Small left mastoid effusion. CT CERVICAL SPINE FINDINGS Alignment: Straightening of the normal cervical lordosis. Slight anterolisthesis of C2 on C3, favor degenerative given facet arthropathy at this level. Broad levocurvature. Skull base and vertebrae: Degenerative Schmorl's nodes involving the inferior T1 and T2 endplates. No evidence of acute fracture. Vertebral body heights are  maintained. Diffuse osteopenia. Soft tissues and spinal canal: No prevertebral fluid or swelling. No visible canal hematoma. Disc levels: Severe degenerative disc disease at multiple levels with disc height loss, endplate sclerosis/cystic change, and posterior disc osteophyte complexes. Partial osseous fusion across the C5-C6 disc and across the C4-C5 right facet joint. Bulky multilevel facet/uncovertebral hypertrophy with suspected severe foraminal stenosis on the right at C3-C4. Upper chest: No consolidation in the visualized lung apices. Other: Calcific atherosclerosis of the carotid arteries. IMPRESSION: CT head: 1. No evidence of acute intracranial abnormality. 2. Similar atrophy and chronic microvascular ischemic disease. CT cervical spine: 1. No evidence of acute fracture or traumatic malalignment the cervical spine. 2. Severe multilevel degenerative disc disease and bulky multilevel facet/uncovertebral hypertrophy with suspected severe foraminal stenosis on the right at C3-C4. MRI could better evaluate the canal/cord/foramina if clinically indicated. Electronically Signed   By: Feliberto Harts MD   On: 06/04/2020 10:29   CT Cervical Spine Wo Contrast  Result Date: 06/04/2020 CLINICAL DATA:  Unwitnessed fall. EXAM: CT HEAD WITHOUT CONTRAST CT CERVICAL SPINE WITHOUT CONTRAST TECHNIQUE: Multidetector CT imaging of the head and cervical spine was performed following the standard protocol without intravenous contrast. Multiplanar CT image  reconstructions of the cervical spine were also generated. COMPARISON:  CT head April 27, 2020 FINDINGS: CT HEAD FINDINGS Brain: No evidence of acute large vascular territory infarction, hemorrhage, hydrocephalus, extra-axial collection or mass lesion/mass effect. Similar generalized cerebral atrophy with ex vacuo ventricular dilation. Similar moderate patchy and confluent white matter hypoattenuation, most likely related to chronic microvascular ischemic disease. Similar  mineralization along the falx and tentorial leaflets. Vascular: No hyperdense vessel identified. Intracranial and extracranial calcific atherosclerosis. Skull: No acute fracture. Sinuses/Orbits: Mild ethmoid air cell mucosal thickening without visible air-fluid level. No acute abnormality in the visualized orbits. Other: Small left mastoid effusion. CT CERVICAL SPINE FINDINGS Alignment: Straightening of the normal cervical lordosis. Slight anterolisthesis of C2 on C3, favor degenerative given facet arthropathy at this level. Broad levocurvature. Skull base and vertebrae: Degenerative Schmorl's nodes involving the inferior T1 and T2 endplates. No evidence of acute fracture. Vertebral body heights are maintained. Diffuse osteopenia. Soft tissues and spinal canal: No prevertebral fluid or swelling. No visible canal hematoma. Disc levels: Severe degenerative disc disease at multiple levels with disc height loss, endplate sclerosis/cystic change, and posterior disc osteophyte complexes. Partial osseous fusion across the C5-C6 disc and across the C4-C5 right facet joint. Bulky multilevel facet/uncovertebral hypertrophy with suspected severe foraminal stenosis on the right at C3-C4. Upper chest: No consolidation in the visualized lung apices. Other: Calcific atherosclerosis of the carotid arteries. IMPRESSION: CT head: 1. No evidence of acute intracranial abnormality. 2. Similar atrophy and chronic microvascular ischemic disease. CT cervical spine: 1. No evidence of acute fracture or traumatic malalignment the cervical spine. 2. Severe multilevel degenerative disc disease and bulky multilevel facet/uncovertebral hypertrophy with suspected severe foraminal stenosis on the right at C3-C4. MRI could better evaluate the canal/cord/foramina if clinically indicated. Electronically Signed   By: Feliberto HartsFrederick S Jones MD   On: 06/04/2020 10:29   US Venous Img Upper Uni Right(DVT)  Result Date: 06/04/2020 CLINICAL DATA:  Right upper  extremity cellulitis EXAM: RIGHT UPPER EXTREMITY VENOUS DOPPLER ULTRASOUND TECHNIQUE: Gray-scale sonography with graded compression, as well as color Doppler and duplex ultrasound were performed to evaluate the upper extremity deep venous system from the level of the subclavian vein and including the jugular, axillary, basilic, radial, ulnar and upper cephalic vein. Spectral Doppler was utilized to evaluate flow at rest and with distal augmentation maneuvers. COMPARISON:  None. FINDINGS: Contralateral Subclavian Vein: Respiratory phasicity is normal and symmetric with the symptomatic side. No evidence of thrombus. Normal compressibility. Internal Jugular Vein: No evidence of thrombus. Normal compressibility, respiratory phasicity and response to augmentation. Subclavian Vein: No evidence of thrombus. Normal compressibility, respiratory phasicity and response to augmentation. Axillary Vein: No evidence of thrombus. Normal compressibility, respiratory phasicity and response to augmentation. Cephalic Vein: No evidence of thrombus. Normal compressibility, respiratory phasicity and response to augmentation. Basilic Vein: No evidence of thrombus. Normal compressibility, respiratory phasicity and response to augmentation. Brachial Veins: No evidence of thrombus. Normal compressibility, respiratory phasicity and response to augmentation. Radial Veins: No evidence of thrombus. Normal compressibility, respiratory phasicity and response to augmentation. Ulnar Veins: No evidence of thrombus. Normal compressibility, respiratory phasicity and response to augmentation. Venous Reflux:  None visualized. Other Findings:  Soft tissue edema about the forearm. IMPRESSION: 1.  No evidence of DVT within the right upper extremity. 2.  Soft tissue edema about the forearm. Electronically Signed   By: Lauralyn PrimesAlex  Bibbey M.D.   On: 06/04/2020 14:22   DG Chest Port 1 View  Result Date: 06/04/2020 CLINICAL DATA:  Unwitnessed fall  EXAM: PORTABLE  CHEST 1 VIEW COMPARISON:  Prior chest x-ray 04/27/2020 FINDINGS: Stable cardiac and mediastinal contours. Atherosclerotic calcification present in the transverse aorta. Stable appearance of the lungs with chronic bronchitic changes and mild interstitial prominence. No focal consolidation, pleural effusion or pneumothorax. No acute fracture identified. IMPRESSION: No active disease. Electronically Signed   By: Malachy Moan M.D.   On: 06/04/2020 11:20   DG Knee Complete 4 Views Right  Result Date: 06/04/2020 CLINICAL DATA:  Pain following fall EXAM: RIGHT KNEE - COMPLETE 4+ VIEW COMPARISON:  None. FINDINGS: Frontal, lateral, and bilateral oblique views were obtained. There is no fracture or dislocation. No evident joint effusion. There is moderate joint space narrowing medially. There is slight joint space narrowing elsewhere. There is chondrocalcinosis. There are foci of arterial vascular calcification. IMPRESSION: No fracture or dislocation. No joint effusion. Osteoarthritic change, most notably medially. Chondrocalcinosis present, a finding that may be seen with osteoarthritis or calcium pyrophosphate deposition disease. Foci of atherosclerotic arterial vascular calcification noted. Electronically Signed   By: Bretta Bang III M.D.   On: 06/04/2020 10:34   DG Hip Unilat With Pelvis 2-3 Views Right  Result Date: 06/04/2020 CLINICAL DATA:  Pain following fall EXAM: DG HIP (WITH OR WITHOUT PELVIS) 2-3V RIGHT COMPARISON:  Pelvis CT April 19, 2020 FINDINGS: Frontal pelvis as well as frontal and lateral right hip images obtained. 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 Bones are diffusely osteoporotic. There is no acute fracture or dislocation. There is mild narrowing of the left hip joint. There is postoperative change in the lower lumbar and upper sacral regions. Multifocal arterial vascular calcification noted. IMPRESSION: Total hip  replacement on the right with prosthetic components well-seated. No fracture or dislocation. Underlying osteoporosis. Postoperative change lower lumbar spine and upper sacrum. Slight narrowing left hip joint. Atherosclerotic arterial vascular calcification present. Electronically Signed   By: Bretta Bang III M.D.   On: 06/04/2020 10:33    Positive ROS: All other systems have been reviewed and were otherwise negative with the exception of those mentioned in the HPI and as above.  Physical Exam: General: Well developed, well nourished male seen in no acute distress. HEENT: Atraumatic and normocephalic. Sclera are clear. Extraocular motion is intact. Oropharynx is clear with moist mucosa. Neck: Supple, nontender, good range of motion. No JVD or carotid bruits. Lungs: Clear to auscultation bilaterally. Cardiovascular: Regular rate and rhythm with normal S1 and S2. No murmurs. No gallops or rubs. Pedal pulses are palpable bilaterally. Homans test is negative bilaterally. No significant pretibial or ankle edema. Abdomen: Soft, nontender, and nondistended. Bowel sounds are present. Skin: No lesions in the area of chief complaint Neurologic: Awake, alert, and oriented. Sensory function is grossly intact. Motor strength is felt to be 5 over 5 bilaterally. No clonus or tremor. Good motor coordination. Lymphatic: No axillary or cervical lymphadenopathy  MUSCULOSKELETAL: R hand: No skin breaks/wounds. Dorsal swelling and erythema focused on mid RF [facility]. No pain w/ passive motion of MCPJs, Mild pain with motion of wrist joint. Pain w/ extensor tendon stretch. NVI.   Assessment: 87 y/o M w/ R hand cellulitis/extensor tendon tenosynovitis.   Plan: No absolute indication for I&D at this time. Patient has not failed trial of IV antibiotics. Dorsal swelling likely tenosynovitis of extensor tendons which will be better delineated on previously ordered MRI.  -Splint/elevate -IV abx -Serial exams.   -Recommend R hand xrays.  -F/u MRI results.   Mena Goes  M.D.

## 2020-06-05 NOTE — Progress Notes (Signed)
Spoke with daughter Revonda Standard over phone. Update given.   Madie Reno, RN

## 2020-06-05 NOTE — Progress Notes (Signed)
Called daughter Revonda Standard on patient's phone so she could talk to her father.   Madie Reno, RN

## 2020-06-05 NOTE — Progress Notes (Signed)
PHARMACIST - PHYSICIAN COMMUNICATION  CONCERNING:  Enoxaparin (Lovenox) for DVT Prophylaxis    RECOMMENDATION: Patient was prescribed enoxaparin 30mg  q24 hours for VTE prophylaxis.   Filed Weights   06/04/20 0924  Weight: 84.9 kg (187 lb 1.6 oz)    Body mass index is 29.3 kg/m.  Estimated Creatinine Clearance: 41.7 mL/min (by C-G formula based on SCr of 1.03 mg/dL).  Patient is candidate for enoxaparin 40mg  every 24 hours based on CrCl >18ml/min and Weight >45kg  DESCRIPTION: Pharmacy has adjusted enoxaparin dose per [facility] policy.  Patient is now receiving enoxaparin 40 mg every 24 hours    31m 06/05/2020 7:36 AM

## 2020-06-05 NOTE — Plan of Care (Signed)

## 2020-06-05 NOTE — Evaluation (Signed)
Occupational Therapy Evaluation Patient Details Name: Gregory Small MRN: [id number] DOB: Nov 08, 1919 Today's Date: 06/05/2020    History of Present Illness 87 yo male with onset of RUE cellulitis from unknown source was brought to ED, after a fall at his ALF which was unwitnessed.  Pt is having some joint pain with no diagnosed injuries.  Noted sepsis, current Covid infection, falls.  PMHx:  recent Covid dx, myasthenia gravis, vertigo, a-fib, GERD, hypothyroidism, HTN, chronic pain, spinal surgery with DJD Lumbar spine, falls, CKD 3a   Clinical Impression   Pt seen for OT evaluation this date in setting of acute hospitalization d/t R UE cellulitis and fall. Pt presents with decreased functional strength, decreased activity tolerance and R UE pain superimposed on some confusion, all impacting his abiltiy to safely perform ADLs. Documentation indicates that pt amb with rollator at ALF. Pt presents this date requiring MOD A for bed mobility (per PT note, pt politely declines to get OOB on OT session). Pt requires MIN A with UB ADLs d/t R UE pain and MAX A with LB ADLs d/t limited tolerance for mobility and LB ROM/strength at this time. Will continue to follow acutely. Anticipate pt will require STR in SNF setting.     Follow Up Recommendations  SNF    Equipment Recommendations  Other (comment) (defer)    Recommendations for Other Services       Precautions / Restrictions Precautions Precautions: Fall Precaution Comments: monitor HR and sats Restrictions Weight Bearing Restrictions: No      Mobility Bed Mobility               General bed mobility comments: deferred. MOD A per PT note    Transfers                 General transfer comment: pt politely declines citing fatigue    Balance                                           ADL either performed or assessed with clinical judgement   ADL Overall ADL's : Needs assistance/impaired                                        General ADL Comments: MIN A with UB, MAX A with LB     Vision Patient Visual Report: No change from baseline       Perception     Praxis      Pertinent Vitals/Pain Pain Assessment: Faces Faces Pain Scale: Hurts little more Pain Location: R UE Pain Descriptors / Indicators: Guarding Pain Intervention(s): Limited activity within patient's tolerance;Monitored during session     Hand Dominance Right   Extremity/Trunk Assessment Upper Extremity Assessment Upper Extremity Assessment: Generalized weakness;RUE deficits/detail RUE Deficits / Details: decreased ROM tolerance 2/2 pain from cellulitis RUE: Unable to fully assess due to pain   Lower Extremity Assessment Lower Extremity Assessment: Generalized weakness       Communication Communication Communication: No difficulties   Cognition Arousal/Alertness: Awake/alert Behavior During Therapy: WFL for tasks assessed/performed Overall Cognitive Status: No family/caregiver present to determine baseline cognitive functioning  General Comments: Pt oriented to self only. Pleasantly confused and able to follow all commands. [facility]   General Comments       Exercises     Shoulder Instructions      Home Living Family/patient expects to be discharged to:: Assisted living                             Home Equipment: Walker - 4 wheels;Shower seat   Additional Comments: pt was walking without assist in ALF      Prior Functioning/Environment Level of Independence: Independent with assistive device(s);Needs assistance  Gait / Transfers Assistance Needed: I with rollator ADL's / Homemaking Assistance Needed: facility gives pt his meds, provides meals and assists with personal care Communication / Swallowing Assistance Needed: HOH          OT Problem List: Decreased strength;Decreased range of motion;Decreased activity  tolerance;Impaired balance (sitting and/or standing);Decreased coordination;Cardiopulmonary status limiting activity;Impaired UE functional use;Pain;Increased edema      OT Treatment/Interventions: Self-care/ADL training;DME and/or AE instruction;Therapeutic activities;Balance training;Therapeutic exercise;Energy conservation;Patient/family education    OT Goals(Current goals can be found in the care plan section) Acute Rehab OT Goals Patient Stated Goal: to hurt less and get better OT Goal Formulation: With patient Time For Goal Achievement: 06/19/20 Potential to Achieve Goals: Fair ADL Goals Pt Will Perform Upper Body Dressing: with set-up;sitting Pt Will Perform Lower Body Dressing: with min assist;sit to/from stand (wiht LRAD/AE) Pt Will Transfer to Toilet: with min assist;ambulating;grab bars;bedside commode (with LRAD for at least ~10' to [facility] to improve tolerance for fxl distances.)  OT Frequency: Min 1X/week   Barriers to D/C:            Co-evaluation              AM-PAC OT "6 Clicks" Daily Activity     Outcome Measure Help from another person eating meals?: A Little Help from another person taking care of personal grooming?: A Little Help from another person toileting, which includes using toliet, bedpan, or urinal?: A Lot Help from another person bathing (including washing, rinsing, drying)?: A Lot Help from another person to put on and taking off regular upper body clothing?: A Lot Help from another person to put on and taking off regular lower body clothing?: A Lot 6 Click Score: 14   End of Session Nurse Communication: Other (comment) (notified that IV beeping)  Activity Tolerance: Patient tolerated treatment well Patient left: in bed;with call bell/phone within reach;with bed alarm set  OT Visit Diagnosis: Unsteadiness on feet (R26.81);Muscle weakness (generalized) (M62.81);Other symptoms and signs involving cognitive function                Time:  1555-1616 OT Time Calculation (min): 21 min Charges:  OT General Charges $OT Visit: 1 Visit OT Evaluation $OT Eval Moderate Complexity: 1 Mod OT Treatments $Self Care/Home Management : 8-22 mins  Rejeana Brock, MS, OTR/L ascom [phone] 06/05/20, 5:39 PM

## 2020-06-05 NOTE — Progress Notes (Signed)
      INFECTIOUS DISEASE ATTENDING ADDENDUM:   Date: 06/05/2020  Patient name: Gregory Small  Medical record number: [id number]  Date of birth: [birth year]        [facility] Antimicrobial Management Team Staphylococcus aureus bacteremia   Staphylococcus aureus bacteremia (SAB) is associated with a high rate of complications and mortality.  Specific aspects of clinical management are critical to optimizing the outcome of patients with SAB.  Therefore, the [facility] Antimicrobial Management Team [facility]) has initiated an intervention aimed at improving the management of SAB at [facility].  To do so, Infectious Diseases physicians are providing an evidence-based consult for the management of all patients with SAB.     Yes No Comments  Perform follow-up blood cultures (even if the patient is afebrile) to ensure clearance of bacteremia [x]  []    Remove vascular catheter and obtain follow-up blood cultures after the removal of the catheter [x]  []  Do not place picc  Perform echocardiography to evaluate for endocarditis (transthoracic ECHO is 40-50% sensitive, TEE is > 90% sensitive) [x]  []  Please keep in mind, that neither test can definitively EXCLUDE endocarditis, and that should clinical suspicion remain high for endocarditis the patient should then still be treated with an "endocarditis" duration of therapy = 6 weeks  Consult electrophysiologist to evaluate implanted cardiac device (pacemaker, ICD) []  []    Ensure source control []  []  Have all abscesses been drained effectively? Have deep seeded infections (septic joints or osteomyelitis) had appropriate surgical debridement?  Cellulitis?  Investigate for "metastatic" sites of infection [x]  []  Does the patient have ANY symptom or physical exam finding that would suggest a deeper infection (back or neck pain that may be suggestive of vertebral osteomyelitis or epidural abscess, muscle pain that could be a symptom of pyomyositis)?  Keep in  mind that for deep seeded infections MRI imaging with contrast is preferred rather than other often insensitive tests such as plain x-rays, especially early in a patient's presentation.  Change antibiotic therapy to cefazolin []  []  Beta-lactam antibiotics are preferred for MSSA due to higher cure rates.   If on Vancomycin, goal trough should be 15 - 20 mcg/mL  Estimated duration of IV antibiotic therapy:  4-6 weeks []  []  Consult case management for probably prolonged outpatient IV antibiotic therapy    Will discuss w primary  06/05/2020, 9:41 AM

## 2020-06-05 NOTE — Progress Notes (Signed)
Pt was seen on side of bed to work on STS and could only do lateral scoot transfers with mod assist.  Focus on his ability to use LE strength, monitor HR and sats, and work on endurance standing and quality of gait.  Follow for acute PT goals.  06/05/20 1200  PT Visit Information  Last PT Received On 06/05/20  Assistance Needed +2  History of Present Illness 87 yo male with onset of RUE cellulitis from unknown source was brought to ED, after a fall at his ALF which was unwitnessed.  Pt is having some joint pain with no diagnosed injuries.  Noted sepsis, current Covid infection, falls.  PMHx:  recent Covid dx, myasthenia gravis, vertigo, a-fib, GERD, hypothyroidism, HTN, chronic pain, spinal surgery with DJD Lumbar spine, falls, CKD 3a  Precautions  Precautions Fall  Precaution Comments monitor HR and sats  Restrictions  Weight Bearing Restrictions No  RUE Weight Bearing WBAT  Home Living  Family/patient expects to be discharged to: Assisted living  Home Equipment Walker - 4 wheels;Shower seat  Additional Comments pt was walking without assist in ALF  Prior Function  Level of Independence Independent with assistive device(s);Needs assistance  Gait / Transfers Assistance Needed I with rollator  ADL's / Homemaking Assistance Needed facility gives pt his meds, provides meals and assists with personal care  Communication / Swallowing Assistance Needed [facility]  Communication  Communication No difficulties  Pain Assessment  Pain Assessment Faces  Faces Pain Scale 4  Pain Location general pain from falling  Pain Descriptors / Indicators Guarding  Pain Intervention(s) Limited activity within patient's tolerance;Monitored during session;Premedicated before session;Repositioned  Cognition  Arousal/Alertness Awake/alert  Behavior During Therapy WFL for tasks assessed/performed  Overall Cognitive Status No family/caregiver present to determine baseline cognitive functioning  General Comments pt can  talk about history but has some limits, such as not being able to name the ALF where he lives  Upper Extremity Assessment  Upper Extremity Assessment Generalized weakness  Lower Extremity Assessment  Lower Extremity Assessment Generalized weakness  Cervical / Trunk Assessment  Cervical / Trunk Assessment Kyphotic  Bed Mobility  Overal bed mobility Needs Assistance  Bed Mobility Supine to Sit;Rolling;Sit to Supine  Rolling Min assist;Mod assist  Supine to sit Mod assist  General bed mobility comments mod assist to lift trunk to side of bed and mod to get legs onto bed  Transfers  Overall transfer level Needs assistance  Equipment used Rolling walker (2 wheeled);1 person hand held assist  Transfers Sit to/from Stand  Ambulation/Gait  General Gait Details unable to stand  Balance  Overall balance assessment Needs assistance  Sitting balance-Leahy Scale Fair  Standing balance-Leahy Scale Zero  General Comments  General comments (skin integrity, edema, etc.) pt was assisted to try to stand unsuccessfully, but is trying to push through legs.  Has been in bed several days.  Exercises  Exercises Other exercises (4- to 4+ strength)  PT - End of Session  Equipment Utilized During Treatment Gait belt;Oxygen  Activity Tolerance Patient limited by fatigue;Treatment limited secondary to medical complications (Comment)  Patient left with call bell/phone within reach;in bed;with bed alarm set  Nurse Communication Mobility status  PT Assessment  PT Visit Diagnosis Unsteadiness on feet (R26.81);Repeated falls (R29.6);Muscle weakness (generalized) (M62.81);Difficulty in walking, not elsewhere classified (R26.2)  PT Plan  PT Frequency (ACUTE ONLY) Min 2X/week  AM-PAC PT "6 Clicks" Mobility Outcome Measure (Version 2)  Help needed turning from your back to your side while in  a flat bed without using bedrails? 2  Help needed moving from lying on your back to sitting on the side of a flat bed without  using bedrails? 2  Help needed moving to and from a bed to a chair (including a wheelchair)? 2  Help needed standing up from a chair using your arms (e.g., wheelchair or bedside chair)? 1  Help needed to walk in hospital room? 1  Help needed climbing 3-5 steps with a railing?  1  6 Click Score 9  Consider Recommendation of Discharge To: CIR/SNF/LTACH  PT Recommendation  Follow Up Recommendations SNF  PT equipment None recommended by PT  Acute Rehab PT Goals  Patient Stated Goal to get stronger  PT Goal Formulation With patient  Time For Goal Achievement 06/19/20  Potential to Achieve Goals Good  PT Time Calculation  PT Start Time (ACUTE ONLY) 1002  PT Stop Time (ACUTE ONLY) 1025  PT Time Calculation (min) (ACUTE ONLY) 23 min  PT General Charges  $$ ACUTE PT VISIT 1 Visit  PT Evaluation  $PT Eval Moderate Complexity 1 Mod  PT Treatments  $Therapeutic Activity 8-22 mins  Written Expression  Dominant Hand Right   Samul Dada, PT MS Acute Rehab Dept. Number: [facility] [id number] and [facility] [phone]

## 2020-06-05 NOTE — Progress Notes (Signed)
PROGRESS NOTE    Gregory Small  WUJ:[id number] DOB: 05-27-1919 DOA: 06/04/2020 PCP: Almetta Lovely, [facility]   Chief complaint right hand pain and swelling. Brief Narrative:  Gregory Small is a 87 y.o. male with medical history significant of hypertension, GERD, hypothyroidism, vertigo, atrial fibrillation on anticoagulants, chronic pain syndrome, CKD stage IIIa, myasthenia gravis after receiving 2 doses of COVID vaccine, who presents with a fall, pain in right hip and right knee, right arm pain and redness. Upon arriving the hospital, he was Covid positive.  Right hand was swelling and tender.  He was diagnosed with cellulitis of right hand, start vancomycin and Rocephin.  Blood culture came back with MSSA, antibiotic switched to cefazolin.   Assessment & Plan:   Principal Problem:   Cellulitis of right upper arm Active Problems:   Myasthenia gravis (HCC)   Atrial fibrillation, chronic (HCC)   Hypothyroidism   Chronic pain syndrome   Fall   CKD (chronic kidney disease), stage IIIa   Sepsis (HCC)   HTN (hypertension)   GERD (gastroesophageal reflux disease)   COVID-19 virus infection  #1.  Sepsis with MSSA septicemia secondary to right arm cellulitis Right arm cellulitis with small right hand abscess. Discussed with ID, patient blood culture came back with MSSA.  Presumed to be the pathogen.  Antibiotic switched to cefazolin. Patient seemed to have some some induration on the right hand, MRI is ordered.  Orthopedics will be consulted. Obtain echocardiogram per recommendation from ID.  #2.  Myasthenia gravis Patient is currently on steroids.  3.  Covid infection. Currently patient does not have any respite symptoms.  She is already on steroids for myasthenia gravis.  Will follow.  4.  Hypothyroidism Continue Synthroid.  5.  Chronic disease stage IIIa. Renal function stable.    DVT prophylaxis: Lovenox Code Status: DNR Family Communication: Called daughter,  condition updated. Disposition Plan:  .   Status is: Inpatient  Remains inpatient appropriate because:Inpatient level of care appropriate due to severity of illness   Dispo: The patient is from: ALF              Anticipated d/c is to: ALF              Patient currently is not medically stable to d/c.   Difficult to place patient No        I/O last 3 completed shifts: In: 300 [IV Piggyback:300] Out: 400 [Urine:400] Total I/O In: -  Out: 100 [Urine:100]     Consultants:   Orthopedics  Procedures: None  Antimicrobials:  Cefazolin.  Subjective: Patient feels a little bit better today than yesterday.  He has a cough, but not short of breath, no hypoxia.  He has a right hand pain, swelling. No fever chills No nausea vomiting, no abdominal pain. No dysuria hematuria.  Objective: Vitals:   06/04/20 2029 06/04/20 2257 06/05/20 0400 06/05/20 0752  BP: 132/71 (!) 154/93 107/70 (!) 149/77  Pulse: 81 85    Resp: 20 18    Temp: (!) 97.4 F (36.3 C) 98.1 F (36.7 C) 98 F (36.7 C) 97.6 F (36.4 C)  TempSrc:   Oral Oral  SpO2: 100% 100%    Weight:      Height:        Intake/Output Summary (Last 24 hours) at 06/05/2020 1011 Last data filed at 06/05/2020 0949 Gross per 24 hour  Intake 300 ml  Output 500 ml  Net -200 ml   Filed Weights   06/04/20  1610  Weight: 84.9 kg    Examination:  General exam: Appears calm and comfortable  Respiratory system: Clear to auscultation. Respiratory effort normal. Cardiovascular system: S1 & S2 heard, RRR. No JVD, murmurs, rubs, gallops or clicks. No pedal edema. Gastrointestinal system: Abdomen is nondistended, soft and nontender. No organomegaly or masses felt. Normal bowel sounds heard. Central nervous system: Alert and oriented x3. No focal neurological deficits. Extremities: Right arm swelling and red, right hand with a small area of induration. Skin: No rashes, lesions or ulcers Psychiatry:  Mood & affect appropriate.      Data Reviewed: I have personally reviewed following labs and imaging studies  CBC: Recent Labs  Lab 06/04/20 0939 06/05/20 0524  WBC 15.8* 13.8*  NEUTROABS 12.9*  --   HGB 12.4* 11.2*  HCT 39.5 35.3*  MCV 97.1 97.2  PLT 186 169   Basic Metabolic Panel: Recent Labs  Lab 06/04/20 0939 06/05/20 0524  NA 137 138  K 4.9 3.6  CL 100 104  CO2 28 26  GLUCOSE 118* 92  BUN 23 23  CREATININE 1.48* 1.03  CALCIUM 8.9 8.5*   GFR: Estimated Creatinine Clearance: 41.7 mL/min (by C-G formula based on SCr of 1.03 mg/dL). Liver Function Tests: Recent Labs  Lab 06/04/20 0939  AST 48*  ALT 21  ALKPHOS 55  BILITOT 1.9*  PROT 6.8  ALBUMIN 3.4*   No results for input(s): LIPASE, AMYLASE in the last 168 hours. No results for input(s): AMMONIA in the last 168 hours. Coagulation Profile: No results for input(s): INR, PROTIME in the last 168 hours. Cardiac Enzymes: No results for input(s): CKTOTAL, CKMB, CKMBINDEX, TROPONINI in the last 168 hours. BNP (last 3 results) No results for input(s): PROBNP in the last 8760 hours. HbA1C: No results for input(s): HGBA1C in the last 72 hours. CBG: No results for input(s): GLUCAP in the last 168 hours. Lipid Profile: No results for input(s): CHOL, HDL, LDLCALC, TRIG, CHOLHDL, LDLDIRECT in the last 72 hours. Thyroid Function Tests: No results for input(s): TSH, T4TOTAL, FREET4, T3FREE, THYROIDAB in the last 72 hours. Anemia Panel: No results for input(s): VITAMINB12, FOLATE, FERRITIN, TIBC, IRON, RETICCTPCT in the last 72 hours. Sepsis Labs: Recent Labs  Lab 06/04/20 1129 06/04/20 1140  PROCALCITON  --  1.12  LATICACIDVEN 1.5  --     Recent Results (from the past 240 hour(s))  Culture, blood (routine x 2)     Status: None (Preliminary result)   Collection Time: 06/04/20 11:40 AM   Specimen: BLOOD RIGHT ARM  Result Value Ref Range Status   Specimen Description BLOOD RIGHT ARM  Final   Special Requests BAA Blood Culture  adequate volume  Final   Culture  Setup Time   Final    GRAM POSITIVE COCCI ANAEROBIC BOTTLE ONLY Organism ID to follow CRITICAL RESULT CALLED TO, READ BACK BY AND VERIFIED WITH: A.CHAPPEL,PHARMD AT 9604 ON 06/05/20 BY GM Performed at [facility], [address] [place], [place] [address]    Culture [facility] POSITIVE COCCI  Final   Report Status PENDING  Incomplete  Blood Culture ID Panel (Reflexed)     Status: Abnormal   Collection Time: 06/04/20 11:40 AM  Result Value Ref Range Status   Enterococcus faecalis NOT DETECTED NOT DETECTED Final   Enterococcus Faecium NOT DETECTED NOT DETECTED Final   Listeria monocytogenes NOT DETECTED NOT DETECTED Final   Staphylococcus species DETECTED (A) NOT DETECTED Final    Comment: CRITICAL RESULT CALLED TO, READ BACK BY  AND VERIFIED WITH: A.CHAPPEL,PHARMD AT 0917 ON 06/05/20 BY GM    Staphylococcus aureus (BCID) DETECTED (A) NOT DETECTED Final    Comment: CRITICAL RESULT CALLED TO, READ BACK BY AND VERIFIED WITH: A.CHAPPEL,PHARMD AT 0917 ON 06/05/20 BY GM    Staphylococcus epidermidis NOT DETECTED NOT DETECTED Final   Staphylococcus lugdunensis NOT DETECTED NOT DETECTED Final   Streptococcus species NOT DETECTED NOT DETECTED Final   Streptococcus agalactiae NOT DETECTED NOT DETECTED Final   Streptococcus pneumoniae NOT DETECTED NOT DETECTED Final   Streptococcus pyogenes NOT DETECTED NOT DETECTED Final   A.calcoaceticus-baumannii NOT DETECTED NOT DETECTED Final   Bacteroides fragilis NOT DETECTED NOT DETECTED Final   Enterobacterales NOT DETECTED NOT DETECTED Final   Enterobacter cloacae complex NOT DETECTED NOT DETECTED Final   Escherichia coli NOT DETECTED NOT DETECTED Final   Klebsiella aerogenes NOT DETECTED NOT DETECTED Final   Klebsiella oxytoca NOT DETECTED NOT DETECTED Final   Klebsiella pneumoniae NOT DETECTED NOT DETECTED Final   Proteus species NOT DETECTED NOT DETECTED Final   Salmonella species NOT DETECTED NOT DETECTED  Final   Serratia marcescens NOT DETECTED NOT DETECTED Final   Haemophilus influenzae NOT DETECTED NOT DETECTED Final   Neisseria meningitidis NOT DETECTED NOT DETECTED Final   Pseudomonas aeruginosa NOT DETECTED NOT DETECTED Final   Stenotrophomonas maltophilia NOT DETECTED NOT DETECTED Final   Candida albicans NOT DETECTED NOT DETECTED Final   Candida auris NOT DETECTED NOT DETECTED Final   Candida glabrata NOT DETECTED NOT DETECTED Final   Candida krusei NOT DETECTED NOT DETECTED Final   Candida parapsilosis NOT DETECTED NOT DETECTED Final   Candida tropicalis NOT DETECTED NOT DETECTED Final   Cryptococcus neoformans/gattii NOT DETECTED NOT DETECTED Final   Meth resistant mecA/C and MREJ NOT DETECTED NOT DETECTED Final    Comment: Performed at [facility], [address] [place], [place] [address]  SARS CORONAVIRUS 2 (TAT 6-24 HRS) Nasopharyngeal Nasopharyngeal Swab     Status: Abnormal   Collection Time: 06/04/20 11:48 AM   Specimen: Nasopharyngeal Swab  Result Value Ref Range Status   SARS Coronavirus 2 POSITIVE (A) NEGATIVE Final    Comment: (NOTE) SARS-CoV-2 target nucleic acids are DETECTED.  The SARS-CoV-2 RNA is generally detectable in upper and lower respiratory specimens during the acute phase of infection. Positive results are indicative of the presence of SARS-CoV-2 RNA. Clinical correlation with patient history and other diagnostic information is  necessary to determine patient infection status. Positive results do not rule out bacterial infection or co-infection with other viruses.  The expected result is Negative.  Fact Sheet for Patients: HairSlick.no  Fact Sheet for Healthcare Providers: quierodirigir.com  This test is not yet approved or cleared by the [place] FDA and  has been authorized for detection and/or diagnosis of SARS-CoV-2 by FDA under an Emergency Use Authorization (EUA). This  EUA will remain  in effect (meaning this test can be used) for the duration of the COVID-19 declaration under Section 564(b)(1) of the Act, 21 U. S.C. section 360bbb-3(b)(1), unless the authorization is terminated or revoked sooner.   Performed at [facility], [address] [place], [place] [address]          Radiology Studies: CT Head Wo Contrast  Result Date: 06/04/2020 CLINICAL DATA:  Unwitnessed fall. EXAM: CT HEAD WITHOUT CONTRAST CT CERVICAL SPINE WITHOUT CONTRAST TECHNIQUE: Multidetector CT imaging of the head and cervical spine was performed following the standard protocol without intravenous contrast. Multiplanar CT image reconstructions  of the cervical spine were also generated. COMPARISON:  CT head April 27, 2020 FINDINGS: CT HEAD FINDINGS Brain: No evidence of acute large vascular territory infarction, hemorrhage, hydrocephalus, extra-axial collection or mass lesion/mass effect. Similar generalized cerebral atrophy with ex vacuo ventricular dilation. Similar moderate patchy and confluent white matter hypoattenuation, most likely related to chronic microvascular ischemic disease. Similar mineralization along the falx and tentorial leaflets. Vascular: No hyperdense vessel identified. Intracranial and extracranial calcific atherosclerosis. Skull: No acute fracture. Sinuses/Orbits: Mild ethmoid air cell mucosal thickening without visible air-fluid level. No acute abnormality in the visualized orbits. Other: Small left mastoid effusion. CT CERVICAL SPINE FINDINGS Alignment: Straightening of the normal cervical lordosis. Slight anterolisthesis of C2 on C3, favor degenerative given facet arthropathy at this level. Broad levocurvature. Skull base and vertebrae: Degenerative Schmorl's nodes involving the inferior T1 and T2 endplates. No evidence of acute fracture. Vertebral body heights are maintained. Diffuse osteopenia. Soft tissues and spinal canal: No prevertebral fluid or swelling.  No visible canal hematoma. Disc levels: Severe degenerative disc disease at multiple levels with disc height loss, endplate sclerosis/cystic change, and posterior disc osteophyte complexes. Partial osseous fusion across the C5-C6 disc and across the C4-C5 right facet joint. Bulky multilevel facet/uncovertebral hypertrophy with suspected severe foraminal stenosis on the right at C3-C4. Upper chest: No consolidation in the visualized lung apices. Other: Calcific atherosclerosis of the carotid arteries. IMPRESSION: CT head: 1. No evidence of acute intracranial abnormality. 2. Similar atrophy and chronic microvascular ischemic disease. CT cervical spine: 1. No evidence of acute fracture or traumatic malalignment the cervical spine. 2. Severe multilevel degenerative disc disease and bulky multilevel facet/uncovertebral hypertrophy with suspected severe foraminal stenosis on the right at C3-C4. MRI could better evaluate the canal/cord/foramina if clinically indicated. Electronically Signed   By: Feliberto Harts MD   On: 06/04/2020 10:29   CT Cervical Spine Wo Contrast  Result Date: 06/04/2020 CLINICAL DATA:  Unwitnessed fall. EXAM: CT HEAD WITHOUT CONTRAST CT CERVICAL SPINE WITHOUT CONTRAST TECHNIQUE: Multidetector CT imaging of the head and cervical spine was performed following the standard protocol without intravenous contrast. Multiplanar CT image reconstructions of the cervical spine were also generated. COMPARISON:  CT head April 27, 2020 FINDINGS: CT HEAD FINDINGS Brain: No evidence of acute large vascular territory infarction, hemorrhage, hydrocephalus, extra-axial collection or mass lesion/mass effect. Similar generalized cerebral atrophy with ex vacuo ventricular dilation. Similar moderate patchy and confluent white matter hypoattenuation, most likely related to chronic microvascular ischemic disease. Similar mineralization along the falx and tentorial leaflets. Vascular: No hyperdense vessel identified.  Intracranial and extracranial calcific atherosclerosis. Skull: No acute fracture. Sinuses/Orbits: Mild ethmoid air cell mucosal thickening without visible air-fluid level. No acute abnormality in the visualized orbits. Other: Small left mastoid effusion. CT CERVICAL SPINE FINDINGS Alignment: Straightening of the normal cervical lordosis. Slight anterolisthesis of C2 on C3, favor degenerative given facet arthropathy at this level. Broad levocurvature. Skull base and vertebrae: Degenerative Schmorl's nodes involving the inferior T1 and T2 endplates. No evidence of acute fracture. Vertebral body heights are maintained. Diffuse osteopenia. Soft tissues and spinal canal: No prevertebral fluid or swelling. No visible canal hematoma. Disc levels: Severe degenerative disc disease at multiple levels with disc height loss, endplate sclerosis/cystic change, and posterior disc osteophyte complexes. Partial osseous fusion across the C5-C6 disc and across the C4-C5 right facet joint. Bulky multilevel facet/uncovertebral hypertrophy with suspected severe foraminal stenosis on the right at C3-C4. Upper chest: No consolidation in the visualized lung apices. Other: Calcific atherosclerosis of the  carotid arteries. IMPRESSION: CT head: 1. No evidence of acute intracranial abnormality. 2. Similar atrophy and chronic microvascular ischemic disease. CT cervical spine: 1. No evidence of acute fracture or traumatic malalignment the cervical spine. 2. Severe multilevel degenerative disc disease and bulky multilevel facet/uncovertebral hypertrophy with suspected severe foraminal stenosis on the right at C3-C4. MRI could better evaluate the canal/cord/foramina if clinically indicated. Electronically Signed   By: Feliberto Harts MD   On: 06/04/2020 10:29   US Venous Img Upper Uni Right(DVT)  Result Date: 06/04/2020 CLINICAL DATA:  Right upper extremity cellulitis EXAM: RIGHT UPPER EXTREMITY VENOUS DOPPLER ULTRASOUND TECHNIQUE: Gray-scale  sonography with graded compression, as well as color Doppler and duplex ultrasound were performed to evaluate the upper extremity deep venous system from the level of the subclavian vein and including the jugular, axillary, basilic, radial, ulnar and upper cephalic vein. Spectral Doppler was utilized to evaluate flow at rest and with distal augmentation maneuvers. COMPARISON:  None. FINDINGS: Contralateral Subclavian Vein: Respiratory phasicity is normal and symmetric with the symptomatic side. No evidence of thrombus. Normal compressibility. Internal Jugular Vein: No evidence of thrombus. Normal compressibility, respiratory phasicity and response to augmentation. Subclavian Vein: No evidence of thrombus. Normal compressibility, respiratory phasicity and response to augmentation. Axillary Vein: No evidence of thrombus. Normal compressibility, respiratory phasicity and response to augmentation. Cephalic Vein: No evidence of thrombus. Normal compressibility, respiratory phasicity and response to augmentation. Basilic Vein: No evidence of thrombus. Normal compressibility, respiratory phasicity and response to augmentation. Brachial Veins: No evidence of thrombus. Normal compressibility, respiratory phasicity and response to augmentation. Radial Veins: No evidence of thrombus. Normal compressibility, respiratory phasicity and response to augmentation. Ulnar Veins: No evidence of thrombus. Normal compressibility, respiratory phasicity and response to augmentation. Venous Reflux:  None visualized. Other Findings:  Soft tissue edema about the forearm. IMPRESSION: 1.  No evidence of DVT within the right upper extremity. 2.  Soft tissue edema about the forearm. Electronically Signed   By: Lauralyn Primes M.D.   On: 06/04/2020 14:22   DG Chest Port 1 View  Result Date: 06/04/2020 CLINICAL DATA:  Unwitnessed fall EXAM: PORTABLE CHEST 1 VIEW COMPARISON:  Prior chest x-ray 04/27/2020 FINDINGS: Stable cardiac and mediastinal  contours. Atherosclerotic calcification present in the transverse aorta. Stable appearance of the lungs with chronic bronchitic changes and mild interstitial prominence. No focal consolidation, pleural effusion or pneumothorax. No acute fracture identified. IMPRESSION: No active disease. Electronically Signed   By: Malachy Moan M.D.   On: 06/04/2020 11:20   DG Knee Complete 4 Views Right  Result Date: 06/04/2020 CLINICAL DATA:  Pain following fall EXAM: RIGHT KNEE - COMPLETE 4+ VIEW COMPARISON:  None. FINDINGS: Frontal, lateral, and bilateral oblique views were obtained. There is no fracture or dislocation. No evident joint effusion. There is moderate joint space narrowing medially. There is slight joint space narrowing elsewhere. There is chondrocalcinosis. There are foci of arterial vascular calcification. IMPRESSION: No fracture or dislocation. No joint effusion. Osteoarthritic change, most notably medially. Chondrocalcinosis present, a finding that may be seen with osteoarthritis or calcium pyrophosphate deposition disease. Foci of atherosclerotic arterial vascular calcification noted. Electronically Signed   By: Bretta Bang III M.D.   On: 06/04/2020 10:34   DG Hip Unilat With Pelvis 2-3 Views Right  Result Date: 06/04/2020 CLINICAL DATA:  Pain following fall EXAM: DG HIP (WITH OR WITHOUT PELVIS) 2-3V RIGHT COMPARISON:  Pelvis CT April 19, 2020 FINDINGS: Frontal pelvis as well as frontal and lateral right hip images obtained.  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 Bones are diffusely osteoporotic. There is no acute fracture or dislocation. There is mild narrowing of the left hip joint. There is postoperative change in the lower lumbar and upper sacral regions. Multifocal arterial vascular calcification noted. IMPRESSION: Total hip replacement on the right with prosthetic components well-seated. No fracture or dislocation. Underlying  osteoporosis. Postoperative change lower lumbar spine and upper sacrum. Slight narrowing left hip joint. Atherosclerotic arterial vascular calcification present. Electronically Signed   By: Bretta BangWilliam  Woodruff III M.D.   On: 06/04/2020 10:33        Scheduled Meds: . brimonidine  1 drop Both Eyes BID  . calcium carbonate  600 mg of elemental calcium Oral Q breakfast  . calcium-vitamin D  1 tablet Oral Daily  . enoxaparin (LOVENOX) injection  40 mg Subcutaneous Q24H  . hydrocortisone sod succinate (SOLU-CORTEF) inj  50 mg Intravenous Q8H  . levothyroxine  88 mcg Oral QAC breakfast  . pantoprazole  40 mg Oral Daily  . pyridostigmine  60 mg Oral TID  . timolol  1 drop Both Eyes BID   Continuous Infusions: .  ceFAZolin (ANCEF) IV       LOS: 1 day    Time spent: 34 minutes, more than 50% time spent on direct patient care.    Marrion Coyekui Witney Huie, MD [facility]   To contact the attending provider between 7A-7P or the covering provider during after hours 7P-7A, please log into the web site [email or web address] and access using universal [place] password for that web site. If you do not have the password, please call the hospital operator.  06/05/2020, 10:11 AM

## 2020-06-05 NOTE — Progress Notes (Signed)
PHARMACY - PHYSICIAN COMMUNICATION CRITICAL VALUE ALERT - BLOOD CULTURE IDENTIFICATION (BCID)  Gregory Small is an 87 y.o. male who presented to [facility] on 06/04/2020 with a chief complaint of right upper arm cellulitis  Assessment:  1/4 bottles GPC so far. BCID detected Staphylococcus aureus. No resistance genes detected. Suspect skin source.   Name of physician (or Provider) Contacted: Dr. Chipper Herb  Current antibiotics: Vancomycin + ceftriaxone  Changes to prescribed antibiotics recommended:  Consolidate antimicrobial therapy to cefazolin  Results for orders placed or performed during the hospital encounter of 06/04/20  Blood Culture ID Panel (Reflexed) (Collected: 06/04/2020 11:40 AM)  Result Value Ref Range   Enterococcus faecalis NOT DETECTED NOT DETECTED   Enterococcus Faecium NOT DETECTED NOT DETECTED   Listeria monocytogenes NOT DETECTED NOT DETECTED   Staphylococcus species DETECTED (A) NOT DETECTED   Staphylococcus aureus (BCID) DETECTED (A) NOT DETECTED   Staphylococcus epidermidis NOT DETECTED NOT DETECTED   Staphylococcus lugdunensis NOT DETECTED NOT DETECTED   Streptococcus species NOT DETECTED NOT DETECTED   Streptococcus agalactiae NOT DETECTED NOT DETECTED   Streptococcus pneumoniae NOT DETECTED NOT DETECTED   Streptococcus pyogenes NOT DETECTED NOT DETECTED   A.calcoaceticus-baumannii NOT DETECTED NOT DETECTED   Bacteroides fragilis NOT DETECTED NOT DETECTED   Enterobacterales NOT DETECTED NOT DETECTED   Enterobacter cloacae complex NOT DETECTED NOT DETECTED   Escherichia coli NOT DETECTED NOT DETECTED   Klebsiella aerogenes NOT DETECTED NOT DETECTED   Klebsiella oxytoca NOT DETECTED NOT DETECTED   Klebsiella pneumoniae NOT DETECTED NOT DETECTED   Proteus species NOT DETECTED NOT DETECTED   Salmonella species NOT DETECTED NOT DETECTED   Serratia marcescens NOT DETECTED NOT DETECTED   Haemophilus influenzae NOT DETECTED NOT DETECTED   Neisseria meningitidis  NOT DETECTED NOT DETECTED   Pseudomonas aeruginosa NOT DETECTED NOT DETECTED   Stenotrophomonas maltophilia NOT DETECTED NOT DETECTED   Candida albicans NOT DETECTED NOT DETECTED   Candida auris NOT DETECTED NOT DETECTED   Candida glabrata NOT DETECTED NOT DETECTED   Candida krusei NOT DETECTED NOT DETECTED   Candida parapsilosis NOT DETECTED NOT DETECTED   Candida tropicalis NOT DETECTED NOT DETECTED   Cryptococcus neoformans/gattii NOT DETECTED NOT DETECTED   Meth resistant mecA/C and MREJ NOT DETECTED NOT DETECTED    Tressie Ellis 06/05/2020  9:50 AM

## 2020-06-06 DIAGNOSIS — L03113 Cellulitis of right upper limb: Secondary | ICD-10-CM | POA: Diagnosis not present

## 2020-06-06 DIAGNOSIS — U071 COVID-19: Secondary | ICD-10-CM | POA: Diagnosis not present

## 2020-06-06 DIAGNOSIS — N1831 Chronic kidney disease, stage 3a: Secondary | ICD-10-CM

## 2020-06-06 LAB — BASIC METABOLIC PANEL
Anion gap: 7 (ref 5–15)
BUN: 25 mg/dL — ABNORMAL HIGH (ref 8–23)
CO2: 27 mmol/L (ref 22–32)
Calcium: 8.4 mg/dL — ABNORMAL LOW (ref 8.9–10.3)
Chloride: 105 mmol/L (ref 98–111)
Creatinine, Ser: 0.89 mg/dL (ref 0.61–1.24)
GFR, Estimated: >60 mL/min (ref >60)
Glucose, Bld: 120 mg/dL — ABNORMAL HIGH (ref 70–99)
Potassium: 3.9 mmol/L (ref 3.5–5.1)
Sodium: 139 mmol/L (ref 135–145)

## 2020-06-06 NOTE — Progress Notes (Signed)
      INFECTIOUS DISEASE ATTENDING ADDENDUM:   Date: 06/06/2020  Patient name: Gregory Small  Medical record number: [id number]  Date of birth: 1919-09-21       87 year old man with past medical history significant for hypertension GERD hypothyroidism vertigo atrial fibrillation chronic kidney disease myasthenia gravis who was admitted with a fall found to have MSSA bacteremia.  He is also Covid positive.  Source of his bacteremia appears to be his right hand which is swollen and tender.  MRI of the hand and wrist does not show evidence of septic wrist or osteomyelitis.  Echocardiogram is pending  Repeat blood cultures have been taken.  I will have Dr. Sampson Goon check on the patient tomorrow.  Acey Lav 06/06/2020, 4:58 PM

## 2020-06-06 NOTE — Progress Notes (Signed)
Attempted to place PIV x2. Unable to get access. Informed charge nurse Windle Guard, RN and she was unable to get access for pt. IV consult placed.

## 2020-06-06 NOTE — Progress Notes (Addendum)
PROGRESS NOTE    Gregory Small  ZOX:[id number] DOB: 10/07/1919 DOA: 06/04/2020 PCP: Almetta Lovely, [facility]   Chief complaint.  Right hand swelling. Brief Narrative:  Gregory Dimes Schneideris a 87 y.o.malewith medical history significant ofhypertension, GERD, hypothyroidism, vertigo, atrial fibrillation on anticoagulants, chronic pain syndrome, CKD stage IIIa,myasthenia gravis after receiving 2 doses of COVID vaccine,who presents with a fall, pain in right hip and right knee, right arm pain and redness. Upon arriving the hospital, he was Covid positive.  Right hand was swelling and tender.  He was diagnosed with cellulitis of right hand, start vancomycin and Rocephin.  Blood culture came back with MSSA, antibiotic switched to cefazolin.   Assessment & Plan:   Principal Problem:   Cellulitis of right upper arm Active Problems:   Myasthenia gravis (HCC)   Atrial fibrillation, chronic (HCC)   Hypothyroidism   Chronic pain syndrome   Fall   CKD (chronic kidney disease), stage IIIa   Sepsis (HCC)   HTN (hypertension)   GERD (gastroesophageal reflux disease)   COVID-19 virus infection   MSSA (methicillin susceptible Staphylococcus aureus) septicemia (HCC)  #1.  Sepsis with MSSA septicemia secondary to right arm cellulitis Right arm cellulitis with small right hand abscess. Condition improving, pending echocardiogram.  Right hand started forming abscess, may need bedside I&D.  Orthopedic surgery is following. Continue cefazolin. MRI of the hand only showed soft tissue swelling without bony or tendon involvement.  2.  Myasthenia gravis. Continue on steroids per  3.  Covid infection. Still has no symptoms.  4.  Chronic kidney disease stage IIIa. Stable.  5.  Chronic atrial fibrillation. Heart rate under control.   DVT prophylaxis: Lovenox Code Status: DNR Family Communication:  Disposition Plan:  .   Status is: Inpatient  Remains inpatient appropriate  because:Inpatient level of care appropriate due to severity of illness   Dispo: The patient is from: ALF              Anticipated d/c is to: ALF              Patient currently is not medically stable to d/c.   Difficult to place patient No        I/O last 3 completed shifts: In: 800 [P.O.:600; IV Piggyback:200] Out: 500 [Urine:500] Total I/O In: -  Out: 100 [Urine:100]     Consultants:   Orthopedic surgery  Procedures: None  Antimicrobials:  Cefazolin Subjective: Patient doing very well today, still has some pain in the right hand with swelling.  Otherwise no secondary short of breath or cough No abdominal pain or nausea vomiting. No fever or chills. No dysuria hematuria.  Objective: Vitals:   06/06/20 0539 06/06/20 0807 06/06/20 1205 06/06/20 1238  BP: (!) 149/85 (!) 158/88 (!) 147/71 140/80  Pulse: 69 82  65  Resp: 20 16  18   Temp: 98.7 F (37.1 C) 97.6 F (36.4 C) 97.7 F (36.5 C) 97.7 F (36.5 C)  TempSrc:   Oral   SpO2: 99% 100%  98%  Weight:      Height:        Intake/Output Summary (Last 24 hours) at 06/06/2020 1426 Last data filed at 06/06/2020 1411 Gross per 24 hour  Intake 220 ml  Output 100 ml  Net 120 ml   Filed Weights   06/04/20 0924  Weight: 84.9 kg    Examination:  General exam: Appears calm and comfortable  Respiratory system: Clear to auscultation. Respiratory effort normal. Cardiovascular system: Regular.  No  JVD, murmurs, rubs, gallops or clicks. No pedal edema. Gastrointestinal system: Abdomen is nondistended, soft and nontender. No organomegaly or masses felt. Normal bowel sounds heard. Central nervous system: Alert and oriented. No focal neurological deficits. Extremities: Right hand swelling, her abscess has formed with a size of 2 to 3 cm. Skin: No rashes, lesions or ulcers Psychiatry: Judgement and insight appear normal. Mood & affect appropriate.     Data Reviewed: I have personally reviewed following labs and  imaging studies  CBC: Recent Labs  Lab 06/04/20 0939 06/05/20 0524  WBC 15.8* 13.8*  NEUTROABS 12.9*  --   HGB 12.4* 11.2*  HCT 39.5 35.3*  MCV 97.1 97.2  PLT 186 169   Basic Metabolic Panel: Recent Labs  Lab 06/04/20 0939 06/05/20 0524 06/06/20 0455  NA 137 138 139  K 4.9 3.6 3.9  CL 100 104 105  CO2 GLUCOSE 118* 92 120*  BUN 23 23 25*  CREATININE 1.48* 1.03 0.89  CALCIUM 8.9 8.5* 8.4*   GFR: Estimated Creatinine Clearance: 48.2 mL/min (by C-G formula based on SCr of 0.89 mg/dL). Liver Function Tests: Recent Labs  Lab 06/04/20 0939  AST 48*  ALT 21  ALKPHOS 55  BILITOT 1.9*  PROT 6.8  ALBUMIN 3.4*   No results for input(s): LIPASE, AMYLASE in the last 168 hours. No results for input(s): AMMONIA in the last 168 hours. Coagulation Profile: No results for input(s): INR, PROTIME in the last 168 hours. Cardiac Enzymes: No results for input(s): CKTOTAL, CKMB, CKMBINDEX, TROPONINI in the last 168 hours. BNP (last 3 results) No results for input(s): PROBNP in the last 8760 hours. HbA1C: No results for input(s): HGBA1C in the last 72 hours. CBG: No results for input(s): GLUCAP in the last 168 hours. Lipid Profile: No results for input(s): CHOL, HDL, LDLCALC, TRIG, CHOLHDL, LDLDIRECT in the last 72 hours. Thyroid Function Tests: No results for input(s): TSH, T4TOTAL, FREET4, T3FREE, THYROIDAB in the last 72 hours. Anemia Panel: No results for input(s): VITAMINB12, FOLATE, FERRITIN, TIBC, IRON, RETICCTPCT in the last 72 hours. Sepsis Labs: Recent Labs  Lab 06/04/20 1129 06/04/20 1140  PROCALCITON  --  1.12  LATICACIDVEN 1.5  --     Recent Results (from the past 240 hour(s))  Culture, blood (routine x 2)     Status: Abnormal (Preliminary result)   Collection Time: 06/04/20 11:40 AM   Specimen: BLOOD RIGHT ARM  Result Value Ref Range Status   Specimen Description   Final    BLOOD RIGHT ARM Performed at [facility], [address] [place], [place] [address]    Special Requests   Final    BAA Blood Culture adequate volume Performed at [facility], [address] [place], [place] [address]    Culture  Setup Time   Final    GRAM POSITIVE COCCI ANAEROBIC BOTTLE ONLY Organism ID to follow CRITICAL RESULT CALLED TO, READ BACK BY AND VERIFIED WITH: A.CHAPPEL,PHARMD AT 0981 ON 06/05/20 BY GM Performed at [facility], [address] [place], [place] [address]    Culture STAPHYLOCOCCUS AUREUS (A)  Final   Report Status PENDING  Incomplete  Blood Culture ID Panel (Reflexed)     Status: Abnormal   Collection Time: 06/04/20 11:40 AM  Result Value Ref Range Status   Enterococcus faecalis NOT DETECTED NOT DETECTED Final   Enterococcus Faecium NOT DETECTED NOT DETECTED Final   Listeria monocytogenes NOT DETECTED NOT DETECTED Final   Staphylococcus species  DETECTED (A) NOT DETECTED Final    Comment: CRITICAL RESULT CALLED TO, READ BACK BY AND VERIFIED WITH: A.CHAPPEL,PHARMD AT 0917 ON 06/05/20 BY GM    Staphylococcus aureus (BCID) DETECTED (A) NOT DETECTED Final    Comment: CRITICAL RESULT CALLED TO, READ BACK BY AND VERIFIED WITH: A.CHAPPEL,PHARMD AT 0917 ON 06/05/20 BY GM    Staphylococcus epidermidis NOT DETECTED NOT DETECTED Final   Staphylococcus lugdunensis NOT DETECTED NOT DETECTED Final   Streptococcus species NOT DETECTED NOT DETECTED Final   Streptococcus agalactiae NOT DETECTED NOT DETECTED Final   Streptococcus pneumoniae NOT DETECTED NOT DETECTED Final   Streptococcus pyogenes NOT DETECTED NOT DETECTED Final   A.calcoaceticus-baumannii NOT DETECTED NOT DETECTED Final   Bacteroides fragilis NOT DETECTED NOT DETECTED Final   Enterobacterales NOT DETECTED NOT DETECTED Final   Enterobacter cloacae complex NOT DETECTED NOT DETECTED Final   Escherichia coli NOT DETECTED NOT DETECTED Final   Klebsiella aerogenes NOT DETECTED NOT DETECTED Final   Klebsiella oxytoca NOT DETECTED NOT DETECTED Final    Klebsiella pneumoniae NOT DETECTED NOT DETECTED Final   Proteus species NOT DETECTED NOT DETECTED Final   Salmonella species NOT DETECTED NOT DETECTED Final   Serratia marcescens NOT DETECTED NOT DETECTED Final   Haemophilus influenzae NOT DETECTED NOT DETECTED Final   Neisseria meningitidis NOT DETECTED NOT DETECTED Final   Pseudomonas aeruginosa NOT DETECTED NOT DETECTED Final   Stenotrophomonas maltophilia NOT DETECTED NOT DETECTED Final   Candida albicans NOT DETECTED NOT DETECTED Final   Candida auris NOT DETECTED NOT DETECTED Final   Candida glabrata NOT DETECTED NOT DETECTED Final   Candida krusei NOT DETECTED NOT DETECTED Final   Candida parapsilosis NOT DETECTED NOT DETECTED Final   Candida tropicalis NOT DETECTED NOT DETECTED Final   Cryptococcus neoformans/gattii NOT DETECTED NOT DETECTED Final   Meth resistant mecA/C and MREJ NOT DETECTED NOT DETECTED Final    Comment: Performed at [facility], [address] [place], [place] [address]  SARS CORONAVIRUS 2 (TAT 6-24 HRS) Nasopharyngeal Nasopharyngeal Swab     Status: Abnormal   Collection Time: 06/04/20 11:48 AM   Specimen: Nasopharyngeal Swab  Result Value Ref Range Status   SARS Coronavirus 2 POSITIVE (A) NEGATIVE Final    Comment: (NOTE) SARS-CoV-2 target nucleic acids are DETECTED.  The SARS-CoV-2 RNA is generally detectable in upper and lower respiratory specimens during the acute phase of infection. Positive results are indicative of the presence of SARS-CoV-2 RNA. Clinical correlation with patient history and other diagnostic information is  necessary to determine patient infection status. Positive results do not rule out bacterial infection or co-infection with other viruses.  The expected result is Negative.  Fact Sheet for Patients: HairSlick.no  Fact Sheet for Healthcare Providers: quierodirigir.com  This test is not yet approved or  cleared by the [place] FDA and  has been authorized for detection and/or diagnosis of SARS-CoV-2 by FDA under an Emergency Use Authorization (EUA). This EUA will remain  in effect (meaning this test can be used) for the duration of the COVID-19 declaration under Section 564(b)(1) of the Act, 21 U. S.C. section 360bbb-3(b)(1), unless the authorization is terminated or revoked sooner.   Performed at [facility], [address], [place], [place] [address]   Culture, blood (Routine X 2) w Reflex to ID Panel     Status: None (Preliminary result)   Collection Time: 06/05/20 10:55 AM   Specimen: BLOOD LEFT HAND  Result Value Ref Range Status  Specimen Description BLOOD LEFT HAND  Final   Special Requests   Final    BOTTLES DRAWN AEROBIC AND ANAEROBIC Blood Culture adequate volume   Culture   Final    NO GROWTH < 24 HOURS Performed at [facility], [address], [place], [place] [address]    Report Status PENDING  Incomplete  Culture, blood (Routine X 2) w Reflex to ID Panel     Status: None (Preliminary result)   Collection Time: 06/05/20 11:51 AM   Specimen: BLOOD  Result Value Ref Range Status   Specimen Description BLOOD BLOOD LEFT HAND  Final   Special Requests   Final    BOTTLES DRAWN AEROBIC AND ANAEROBIC Blood Culture adequate volume   Culture   Final    NO GROWTH < 24 HOURS Performed at [facility], [address], [place], [place] [address]    Report Status PENDING  Incomplete         Radiology Studies: MR HAND RIGHT WO CONTRAST  Result Date: 06/06/2020 CLINICAL DATA:  Right hand pain and swelling.  Bacteremia. EXAM: MRI OF THE RIGHT HAND WITHOUT CONTRAST TECHNIQUE: Multiplanar, multisequence MR imaging of the right hand was performed. No intravenous contrast was administered. COMPARISON:  Right hand x-rays dated June 05, 2020. FINDINGS: Despite efforts by the technologist and patient, motion artifact is present on today's exam and  could not be eliminated. This reduces exam sensitivity and specificity. Bones/Joint/Cartilage No suspicious marrow signal abnormality. No fracture or dislocation. Mild osteoarthritis of the wrist, MCP joints, and IP joints. Large cyst in the lunate. Small midcarpal joint effusion. Ligaments Collateral ligaments are intact. Muscles and Tendons Flexor and extensor tendons are intact. There is prominent soft tissue edema around the extensor tendons but not within the tendon sheaths themselves. Soft tissue Diffuse soft tissue swelling, most severe over the dorsal hand. No fluid collection or hematoma. No soft tissue mass. IMPRESSION: 1. Diffuse soft tissue swelling, most severe over the dorsal hand, consistent with reported history of cellulitis. No abscess. 2. No evidence of osteomyelitis. 3. No tenosynovitis. Prominent soft tissue edema around the extensor tendons but not within the tendon sheaths themselves. Electronically Signed   By: Obie Dredge M.D.   On: 06/06/2020 09:31   DG Hand 2 View Right  Result Date: 06/05/2020 CLINICAL DATA:  Right upper arm cellulitis with right hand pain and swelling. EXAM: RIGHT HAND - 2 VIEW COMPARISON:  None. FINDINGS: Diffuse dorsal soft tissue swelling, most pronounced at the level of the distal metacarpals. No fractures, bone destruction or periosteal reaction. No soft tissue gas. Mild-to-moderate 3rd MCP joint degenerative changes. Extensive arterial calcifications. IMPRESSION: 1. Diffuse dorsal soft tissue swelling without underlying bony abnormality. 2. Extensive atheromatous arterial calcifications. Electronically Signed   By: Beckie Salts M.D.   On: 06/05/2020 15:39        Scheduled Meds: . brimonidine  1 drop Both Eyes BID  . calcium carbonate  600 mg of elemental calcium Oral Q breakfast  . calcium-vitamin D  1 tablet Oral Daily  . enoxaparin (LOVENOX) injection  40 mg Subcutaneous Q24H  . hydrocortisone sod succinate (SOLU-CORTEF) inj  50 mg Intravenous  Q8H  . levothyroxine  88 mcg Oral QAC breakfast  . pantoprazole  40 mg Oral Daily  . pyridostigmine  60 mg Oral TID  . timolol  1 drop Both Eyes BID   Continuous Infusions: .  ceFAZolin (ANCEF) IV 2 g (06/06/20 1411)     LOS: 2 days  Time spent: 27 minutes    Marrion Coyekui Srihan Brutus, MD [facility]   To contact the attending provider between 7A-7P or the covering provider during after hours 7P-7A, please log into the web site [email or web address] and access using universal [place] password for that web site. If you do not have the password, please call the hospital operator.  06/06/2020, 2:26 PM

## 2020-06-06 NOTE — NC FL2 (Signed)
[place] MEDICAID FL2 LEVEL OF CARE SCREENING TOOL     IDENTIFICATION  Patient Name: Gregory Small Birthdate: April 23, 1919 Sex: male Admission Date (Current Location): 06/04/2020  [place] and [place] Number:  Chiropodist and Address:  [facility], [address], [place], [place] [address]      Provider Number: [id number]  Attending Physician Name and Address:  Marrion Coy, MD  Relative Name and Phone Number:  Dylann Gallier - [phone]    Current Level of Care: Hospital Recommended Level of Care: Skilled Nursing Facility Prior Approval Number:    Date Approved/Denied:   PASRR Number: [id number]  Discharge Plan:      Current Diagnoses: Patient Active Problem List   Diagnosis Date Noted  . MSSA (methicillin susceptible Staphylococcus aureus) septicemia (HCC) 06/05/2020  . Fall 06/04/2020  . Cellulitis of right upper arm 06/04/2020  . CKD (chronic kidney disease), stage IIIa 06/04/2020  . Sepsis (HCC) 06/04/2020  . HTN (hypertension) 06/04/2020  . GERD (gastroesophageal reflux disease) 06/04/2020  . COVID-19 virus infection 06/04/2020  . Vertigo 04/28/2020  . Acute kidney injury (HCC) 04/21/2020  . Left knee pain 04/20/2020  . Chronic pain syndrome 04/20/2020  . Left hip pain 04/19/2020  . Myasthenia gravis (HCC) 04/19/2020  . Atrial fibrillation, chronic (HCC) 04/19/2020  . Hypothyroidism 04/19/2020    Orientation RESPIRATION BLADDER Height & Weight     Self,Time,Situation,Place  Normal Continent Weight: 187 lb 1.6 oz (84.9 kg) Height:  5\' 7"  (170.2 cm)  BEHAVIORAL SYMPTOMS/MOOD NEUROLOGICAL BOWEL NUTRITION STATUS      Continent Diet (dysphagia 2 diet; thin liquids)  AMBULATORY STATUS COMMUNICATION OF NEEDS Skin   Limited Assist Verbally Other (Comment) (cellulitis)                       Personal Care Assistance Level of Assistance  Bathing,Feeding,Dressing Bathing Assistance: Limited  assistance Feeding assistance: Independent Dressing Assistance: Limited assistance     Functional Limitations Info             SPECIAL CARE FACTORS FREQUENCY  PT (By licensed PT),OT (By licensed OT)     PT Frequency: 5 x/week OT Frequency: 5 x/week            Contractures      Additional Factors Info  Code Status,Allergies Code Status Info: DNR Allergies Info: simvastatin, sulfa antibiotics           Current Medications (06/06/2020):  This is the current hospital active medication list Current Facility-Administered Medications  Medication Dose Route Frequency Provider Last Rate Last Admin  . acetaminophen (TYLENOL) tablet 650 mg  650 mg Oral Q6H PRN 08/06/2020, MD   650 mg at 06/04/20 1150  . albuterol (VENTOLIN HFA) 108 (90 Base) MCG/ACT inhaler 1-2 puff  1-2 puff Inhalation Q6H PRN 08/04/20, MD      . brimonidine (ALPHAGAN) 0.2 % ophthalmic solution 1 drop  1 drop Both Eyes BID Lorretta Harp, MD   1 drop at 06/06/20 1010  . calcium carbonate (TUMS - dosed in mg elemental calcium) chewable tablet 600 mg of elemental calcium  600 mg of elemental calcium Oral Q breakfast 08/06/20, MD   600 mg of elemental calcium at 06/06/20 1009  . calcium-vitamin D (OSCAL WITH D) 500-200 MG-UNIT per tablet 1 tablet  1 tablet Oral Daily 08/06/20, MD   1 tablet at 06/06/20 1023  . ceFAZolin (ANCEF) IVPB 2g/100 mL premix  2 g Intravenous  Q8H Dorothea Ogle B, RPH 200 mL/hr at 06/06/20 1411 2 g at 06/06/20 1411  . dextromethorphan-guaiFENesin (MUCINEX DM) 30-600 MG per 12 hr tablet 1 tablet  1 tablet Oral BID PRN Lorretta Harp, MD   1 tablet at 06/05/20 0855  . enoxaparin (LOVENOX) injection 40 mg  40 mg Subcutaneous Q24H Tressie Ellis, RPH   40 mg at 06/05/20 2124  . hydrALAZINE (APRESOLINE) injection 5 mg  5 mg Intravenous Q2H PRN Lorretta Harp, MD      . hydrocortisone sodium succinate (SOLU-CORTEF) 100 MG injection 50 mg  50 mg Intravenous Q8H Lorretta Harp, MD   50 mg at 06/06/20 1011  .  levothyroxine (SYNTHROID) tablet 88 mcg  88 mcg Oral QAC breakfast Lorretta Harp, MD   88 mcg at 06/06/20 4270  . loperamide (IMODIUM) capsule 2-4 mg  2-4 mg Oral QID PRN Lorretta Harp, MD      . meclizine (ANTIVERT) tablet 25 mg  25 mg Oral TID PRN Lorretta Harp, MD      . ondansetron [facility]) injection 4 mg  4 mg Intravenous Q8H PRN Lorretta Harp, MD      . oxyCODONE (Oxy IR/ROXICODONE) immediate release tablet 5 mg  5 mg Oral Q6H PRN Lorretta Harp, MD   5 mg at 06/05/20 0855  . pantoprazole (PROTONIX) EC tablet 40 mg  40 mg Oral Daily Lorretta Harp, MD   40 mg at 06/06/20 1009  . pyridostigmine (MESTINON) tablet 60 mg  60 mg Oral TID Lorretta Harp, MD   60 mg at 06/06/20 1411  . timolol (TIMOPTIC) 0.25 % ophthalmic solution 1 drop  1 drop Both Eyes BID Lorretta Harp, MD   1 drop at 06/06/20 1010     Discharge Medications: Please see discharge summary for a list of discharge medications.  Relevant Imaging Results:  Relevant Lab Results:   Additional Information SS #: [id number]  Venissa Nappi E Traye Bates, LCSW

## 2020-06-06 NOTE — Progress Notes (Addendum)
Patient is re-examined at [facility] 06/06/20 and found to have a small skin opening along dorsal/distal ulnar side of hand. There is purulent drainage noted. The dorsal hand is decompressed at the bedside and a bandage placed. He will continue to elevate and I will re-examine tomorrow. Will consider I&D if he has not improved. Continue IV antibiotics and follow cultures.   Subjective:  Patient reports pain as moderate.   Objective:   VITALS:   Vitals:   06/06/20 0539 06/06/20 0807 06/06/20 1205 06/06/20 1238  BP: (!) 149/85 (!) 158/88 (!) 147/71 140/80  Pulse: 69 82  65  Resp: 20 16  18   Temp: 98.7 F (37.1 C) 97.6 F (36.4 C) 97.7 F (36.5 C) 97.7 F (36.5 C)  TempSrc:   Oral   SpO2: 99% 100%  98%  Weight:      Height:        PHYSICAL EXAM:  Sensation intact distally Compartment soft R hand: No skin breaks/wounds. Dorsal swelling and erythema  No pain w/ passive motion of MCP  LABS  Results for orders placed or performed during the hospital encounter of 06/04/20 (from the past 24 hour(s))  Basic metabolic panel     Status: Abnormal   Collection Time: 06/06/20  4:55 AM  Result Value Ref Range   Sodium 139 135 - 145 mmol/L   Potassium 3.9 3.5 - 5.1 mmol/L   Chloride 105 98 - 111 mmol/L   CO2 27 22 - 32 mmol/L   Glucose, Bld 120 (H) 70 - 99 mg/dL   BUN 25 (H) 8 - 23 mg/dL   Creatinine, Ser 08/06/20 0.61 - 1.24 mg/dL   Calcium 8.4 (L) 8.9 - 10.3 mg/dL   GFR, Estimated 7.26 >20 mL/min   Anion gap 7 5 - 15    MR HAND RIGHT WO CONTRAST  Result Date: 06/06/2020 CLINICAL DATA:  Right hand pain and swelling.  Bacteremia. EXAM: MRI OF THE RIGHT HAND WITHOUT CONTRAST TECHNIQUE: Multiplanar, multisequence MR imaging of the right hand was performed. No intravenous contrast was administered. COMPARISON:  Right hand x-rays dated June 05, 2020. FINDINGS: Despite efforts by the technologist and patient, motion artifact is present on today's exam and could not be eliminated. This reduces exam  sensitivity and specificity. Bones/Joint/Cartilage No suspicious marrow signal abnormality. No fracture or dislocation. Mild osteoarthritis of the wrist, MCP joints, and IP joints. Large cyst in the lunate. Small midcarpal joint effusion. Ligaments Collateral ligaments are intact. Muscles and Tendons Flexor and extensor tendons are intact. There is prominent soft tissue edema around the extensor tendons but not within the tendon sheaths themselves. Soft tissue Diffuse soft tissue swelling, most severe over the dorsal hand. No fluid collection or hematoma. No soft tissue mass. IMPRESSION: 1. Diffuse soft tissue swelling, most severe over the dorsal hand, consistent with reported history of cellulitis. No abscess. 2. No evidence of osteomyelitis. 3. No tenosynovitis. Prominent soft tissue edema around the extensor tendons but not within the tendon sheaths themselves. Electronically Signed   By: June 07, 2020 M.D.   On: 06/06/2020 09:31   DG Hand 2 View Right  Result Date: 06/05/2020 CLINICAL DATA:  Right upper arm cellulitis with right hand pain and swelling. EXAM: RIGHT HAND - 2 VIEW COMPARISON:  None. FINDINGS: Diffuse dorsal soft tissue swelling, most pronounced at the level of the distal metacarpals. No fractures, bone destruction or periosteal reaction. No soft tissue gas. Mild-to-moderate 3rd MCP joint degenerative changes. Extensive arterial calcifications. IMPRESSION: 1. Diffuse dorsal soft  tissue swelling without underlying bony abnormality. 2. Extensive atheromatous arterial calcifications. Electronically Signed   By: Beckie Salts M.D.   On: 06/05/2020 15:39    Assessment/Plan:     Principal Problem:   Cellulitis of right upper arm Active Problems:   Myasthenia gravis (HCC)   Atrial fibrillation, chronic (HCC)   Hypothyroidism   Chronic pain syndrome   Fall   CKD (chronic kidney disease), stage IIIa   Sepsis (HCC)   HTN (hypertension)   GERD (gastroesophageal reflux disease)   COVID-19  virus infection   MSSA (methicillin susceptible Staphylococcus aureus) septicemia (HCC)   Continue ABX therapy per ID No surgical intervention required Please call with questions   Lyndle Herrlich , MD 06/06/2020, 3:40 PM

## 2020-06-06 NOTE — TOC Initial Note (Signed)
Transition of Care [facility]) - Initial/Assessment Note    Patient Details  Name: Gregory Small MRN: [id number] Date of Birth: 1919-08-21  Transition of Care [facility]) CM/SW Contact:    Liliana Cline, LCSW Phone Number: 06/06/2020, 12:17 PM  Clinical Narrative:                Patient is on Airborne Precautions, tested positive for COVID on 4/8. Called patient in room to discuss discharge planning. He asked that I call his daughter Revonda Standard. Spoke with Revonda Standard. Patient is a resident at [facility]. Has used [facility] in the past. PCP is [facility]. Patient uses a rollator. No SNF history. Patient has had 2 Moderna COVID shots, but has not had the booster due to a reaction to the first shots.   CSW spoke to both [place] and [place] at [facility], explained recommendation for SNF rehab before returning to [facility]. Revonda Standard stated she would prefer patient to return to [facility] with HHPT/OT. Harriett Sine reported patient will most likely have to go to SNF for rehab prior to returning to ALF due to weakness, Revonda Standard verbalized understanding. Revonda Standard reported she would prefer [facility] in [place] or [facility] in [place]. She works with SNFs and said she plans to call a few that she is interested in tomorrow (Monday) and will follow up with weekday TOC. Revonda Standard and Harriett Sine understand that most SNFs will not take patients until a certain quarantine period post positive COVID test. Revonda Standard said patient had a positive test at the ALF on Thursday 4/7, may need to get copy of this.  Expected Discharge Plan: Skilled Nursing Facility Barriers to Discharge: Continued Medical Work up   Patient Goals and CMS Choice Patient states their goals for this hospitalization and ongoing recovery are:: prefers back to ALF, understands if SNF is needed CMS Medicare.gov Compare Post Acute Care list provided to:: Patient Choice offered to / list presented to : Patient,Adult Children  Expected Discharge Plan  and Services Expected Discharge Plan: Skilled Nursing Facility       Living arrangements for the past 2 months: Assisted Living Facility                                      Prior Living Arrangements/Services Living arrangements for the past 2 months: Assisted Living Facility Lives with:: Facility Resident Patient language and need for interpreter reviewed:: Yes Do you feel safe going back to the place where you live?: Yes      Need for Family Participation in Patient Care: Yes (Comment) Care giver support system in place?: Yes (comment) Current home services: DME    Activities of Daily Living Home Assistive Devices/Equipment: [facility] (specify type) ADL Screening (condition at time of admission) Patient's cognitive ability adequate to safely complete daily activities?: Yes Is the patient deaf or have difficulty hearing?: Yes Does the patient have difficulty seeing, even when wearing glasses/contacts?: Yes Does the patient have difficulty concentrating, remembering, or making decisions?: Yes Patient able to express need for assistance with ADLs?: Yes Does the patient have difficulty dressing or bathing?: Yes Independently performs ADLs?: No Communication: Needs assistance Is this a change from baseline?: Pre-admission baseline Dressing (OT): Appropriate for developmental age Grooming: Appropriate for developmental age Feeding: Appropriate for developmental age Bathing: Appropriate for developmental age Toileting: Appropriate for developmental age In/Out Bed: Appropriate for developmental age 16 in Home: Appropriate for developmental  age Does the patient have difficulty walking or climbing stairs?: Yes Weakness of Legs: Both Weakness of Arms/Hands: Both  Permission Sought/Granted Permission sought to share information with : Facility Contractor granted to share information with : Yes, Verbal Permission Granted  Share  Information with NAME: Revonda Standard- daughter  Permission granted to share info w AGENCY: SNF, HH, DME, [place]        Emotional Assessment       Orientation: : Oriented to Self,Oriented to Place,Oriented to  Time,Oriented to Situation Alcohol / Substance Use: Not Applicable Psych Involvement: No (comment)  Admission diagnosis:  Cough [R05.9] Cellulitis of right upper extremity [L03.113] Cellulitis of right upper arm [L03.113] Fall, initial encounter [W19.XXXA] Patient Active Problem List   Diagnosis Date Noted  . MSSA (methicillin susceptible Staphylococcus aureus) septicemia (HCC) 06/05/2020  . Fall 06/04/2020  . Cellulitis of right upper arm 06/04/2020  . CKD (chronic kidney disease), stage IIIa 06/04/2020  . Sepsis (HCC) 06/04/2020  . HTN (hypertension) 06/04/2020  . GERD (gastroesophageal reflux disease) 06/04/2020  . COVID-19 virus infection 06/04/2020  . Vertigo 04/28/2020  . Acute kidney injury (HCC) 04/21/2020  . Left knee pain 04/20/2020  . Chronic pain syndrome 04/20/2020  . Left hip pain 04/19/2020  . Myasthenia gravis (HCC) 04/19/2020  . Atrial fibrillation, chronic (HCC) 04/19/2020  . Hypothyroidism 04/19/2020   PCP:  [employer], [facility]:   [facility] [phone] Dan [place]re[place], [place] - [address] Carloyn Jaeger [place] [address] Phone: [phone] Fax: [phone]     Social Determinants of Health (SDOH) Interventions    Readmission Risk Interventions No flowsheet data found.

## 2020-06-07 ENCOUNTER — Inpatient Hospital Stay: Payer: Self-pay

## 2020-06-07 ENCOUNTER — Inpatient Hospital Stay: Payer: Medicare Other | Admitting: Anesthesiology

## 2020-06-07 ENCOUNTER — Encounter
Admission: EM | Disposition: A | Discharge status: Skilled Nursing Facility | Payer: Self-pay | Source: Skilled Nursing Facility | Attending: Hospitalist

## 2020-06-07 ENCOUNTER — Inpatient Hospital Stay (HOSPITAL_COMMUNITY)
Admit: 2020-06-07 | Discharge: 2020-06-07 | Disposition: A | Discharge status: Home / Self Care | Payer: Medicare Other | Attending: Internal Medicine | Admitting: Internal Medicine

## 2020-06-07 DIAGNOSIS — R7881 Bacteremia: Secondary | ICD-10-CM

## 2020-06-07 DIAGNOSIS — L03113 Cellulitis of right upper limb: Secondary | ICD-10-CM | POA: Diagnosis not present

## 2020-06-07 DIAGNOSIS — I482 Chronic atrial fibrillation, unspecified: Secondary | ICD-10-CM | POA: Diagnosis not present

## 2020-06-07 DIAGNOSIS — U071 COVID-19: Secondary | ICD-10-CM | POA: Diagnosis not present

## 2020-06-07 HISTORY — PX: INCISION AND DRAINAGE OF WOUND: SHX1803

## 2020-06-07 LAB — CULTURE, BLOOD (ROUTINE X 2): Special Requests: ADEQUATE

## 2020-06-07 LAB — ECHOCARDIOGRAM COMPLETE
AR max vel: 1.51 cm2
AV Area VTI: 1.14 cm2
AV Area mean vel: 1.32 cm2
AV Mean grad: 5 mmHg
AV Peak grad: 9.1 mmHg
Ao pk vel: 1.51 m/s
Area-P 1/2: 3.1 cm2
Height: 67 in
MV VTI: 1.23 cm2
S' Lateral: 2.3 cm
Weight: 2993.6 oz

## 2020-06-07 LAB — BASIC METABOLIC PANEL
Anion gap: 8 (ref 5–15)
BUN: 24 mg/dL — ABNORMAL HIGH (ref 8–23)
CO2: 28 mmol/L (ref 22–32)
Calcium: 8.4 mg/dL — ABNORMAL LOW (ref 8.9–10.3)
Chloride: 103 mmol/L (ref 98–111)
Creatinine, Ser: 0.92 mg/dL (ref 0.61–1.24)
GFR, Estimated: >60 mL/min (ref >60)
Glucose, Bld: 115 mg/dL — ABNORMAL HIGH (ref 70–99)
Potassium: 3.9 mmol/L (ref 3.5–5.1)
Sodium: 139 mmol/L (ref 135–145)

## 2020-06-07 SURGERY — IRRIGATION AND DEBRIDEMENT WOUND
Anesthesia: General | Site: Hand | Laterality: Right

## 2020-06-07 MED ORDER — DOCUSATE SODIUM 100 MG PO CAPS
100.0000 mg | ORAL_CAPSULE | Freq: Two times a day (BID) | ORAL | Status: DC
  Administered 2020-06-07 – 2020-06-14 (×13): 100 mg via ORAL
  Filled 2020-06-07 (×12): qty 1

## 2020-06-07 MED ORDER — METOCLOPRAMIDE HCL 5 MG/ML IJ SOLN
5.0000 mg | Freq: Three times a day (TID) | INTRAMUSCULAR | Status: DC | PRN
Start: 2020-06-07 — End: 2020-06-14

## 2020-06-07 MED ORDER — SODIUM CHLORIDE 0.9 % IV SOLN: INTRAVENOUS | Status: DC | PRN

## 2020-06-07 MED ORDER — CEFAZOLIN SODIUM-DEXTROSE 2-4 GM/100ML-% IV SOLN
2.0000 g | Freq: Three times a day (TID) | INTRAVENOUS | Status: DC
  Administered 2020-06-08 – 2020-06-14 (×17): 2 g via INTRAVENOUS
  Filled 2020-06-07 (×20): qty 100

## 2020-06-07 MED ORDER — FENTANYL CITRATE (PF) 100 MCG/2ML IJ SOLN
INTRAMUSCULAR | Status: AC
  Filled 2020-06-07: qty 2

## 2020-06-07 MED ORDER — ONDANSETRON HCL 4 MG/2ML IJ SOLN: 4.0000 mg | Freq: Four times a day (QID) | INTRAMUSCULAR | Status: DC | PRN

## 2020-06-07 MED ORDER — LINEZOLID 600 MG PO TABS
600.0000 mg | ORAL_TABLET | Freq: Two times a day (BID) | ORAL | Status: AC
  Administered 2020-06-07 – 2020-06-08 (×2): 600 mg via ORAL
  Filled 2020-06-07 (×2): qty 1

## 2020-06-07 MED ORDER — PROPOFOL 10 MG/ML IV BOLUS
INTRAVENOUS | Status: AC
  Filled 2020-06-07: qty 20

## 2020-06-07 MED ORDER — PROPOFOL 10 MG/ML IV BOLUS
INTRAVENOUS | Status: DC | PRN
  Administered 2020-06-07: 80 mg via INTRAVENOUS

## 2020-06-07 MED ORDER — METOCLOPRAMIDE HCL 5 MG PO TABS: 5.0000 mg | ORAL_TABLET | Freq: Three times a day (TID) | ORAL | Status: DC | PRN

## 2020-06-07 MED ORDER — NEOMYCIN-POLYMYXIN B GU 40-200000 IR SOLN
Status: DC | PRN
  Administered 2020-06-07: 16 mL

## 2020-06-07 MED ORDER — DEXAMETHASONE SODIUM PHOSPHATE 10 MG/ML IJ SOLN
INTRAMUSCULAR | Status: DC | PRN
  Administered 2020-06-07: 10 mg via INTRAVENOUS

## 2020-06-07 MED ORDER — PREDNISONE 20 MG PO TABS
50.0000 mg | ORAL_TABLET | Freq: Two times a day (BID) | ORAL | Status: DC
  Administered 2020-06-08 – 2020-06-12 (×9): 50 mg via ORAL
  Filled 2020-06-07 (×9): qty 3

## 2020-06-07 MED ORDER — FENTANYL CITRATE (PF) 100 MCG/2ML IJ SOLN
INTRAMUSCULAR | Status: DC | PRN
  Administered 2020-06-07 (×4): 25 ug via INTRAVENOUS

## 2020-06-07 MED ORDER — ONDANSETRON HCL 4 MG/2ML IJ SOLN
INTRAMUSCULAR | Status: DC | PRN
  Administered 2020-06-07: 4 mg via INTRAVENOUS

## 2020-06-07 MED ORDER — ONDANSETRON HCL 4 MG PO TABS
4.0000 mg | ORAL_TABLET | Freq: Four times a day (QID) | ORAL | Status: DC | PRN
Start: 2020-06-07 — End: 2020-06-14

## 2020-06-07 SURGICAL SUPPLY — 42 items
BNDG COHESIVE 4X5 TAN STRL (GAUZE/BANDAGES/DRESSINGS) ×2 IMPLANT
BNDG CONFORM 2 STRL LF (GAUZE/BANDAGES/DRESSINGS) ×2 IMPLANT
BNDG GAUZE 1X2.1 STRL (MISCELLANEOUS) ×2 IMPLANT
BNDG GAUZE 4.5X4.1 6PLY STRL (MISCELLANEOUS) ×2 IMPLANT
BRUSH SCRUB EZ  4% CHG (MISCELLANEOUS) ×2
BRUSH SCRUB EZ 4% CHG (MISCELLANEOUS) ×2 IMPLANT
CANISTER SUCT 1200ML W/VALVE (MISCELLANEOUS) IMPLANT
CANISTER SUCT 3000ML PPV (MISCELLANEOUS) ×2 IMPLANT
CAST PADDING 2X4YD ST 30245 (MISCELLANEOUS)
CHLORAPREP W/TINT 26 (MISCELLANEOUS) ×4 IMPLANT
COVER WAND RF STERILE (DRAPES) ×2 IMPLANT
DRAIN PENROSE 1/4X12 LTX STRL (WOUND CARE) IMPLANT
DRAPE 3/4 80X56 (DRAPES) IMPLANT
DRAPE INCISE IOBAN 66X60 STRL (DRAPES) IMPLANT
DRAPE SURG 17X11 SM STRL (DRAPES) IMPLANT
DRAPE U-SHAPE 47X51 STRL (DRAPES) IMPLANT
ELECT REM PT RETURN 9FT ADLT (ELECTROSURGICAL) ×2
ELECTRODE REM PT RTRN 9FT ADLT (ELECTROSURGICAL) ×1 IMPLANT
GAUZE SPONGE 4X4 12PLY STRL (GAUZE/BANDAGES/DRESSINGS) ×2 IMPLANT
GAUZE XEROFORM 1X8 LF (GAUZE/BANDAGES/DRESSINGS) ×2 IMPLANT
GLOVE SURG ORTHO LTX SZ8 (GLOVE) ×2 IMPLANT
GLOVE SURG UNDER LTX SZ8 (GLOVE) ×2 IMPLANT
GOWN STRL REUS W/ TWL LRG LVL3 (GOWN DISPOSABLE) ×1 IMPLANT
GOWN STRL REUS W/ TWL XL LVL3 (GOWN DISPOSABLE) ×1 IMPLANT
GOWN STRL REUS W/TWL LRG LVL3 (GOWN DISPOSABLE) ×1
GOWN STRL REUS W/TWL XL LVL3 (GOWN DISPOSABLE) ×1
KIT TURNOVER KIT A (KITS) ×2 IMPLANT
MANIFOLD NEPTUNE II (INSTRUMENTS) ×2 IMPLANT
NS IRRIG 1000ML POUR BTL (IV SOLUTION) ×2 IMPLANT
PACK EXTREMITY ARMC (MISCELLANEOUS) ×2 IMPLANT
PADDING CAST COTTON 2X4 ST (MISCELLANEOUS) IMPLANT
STAPLER SKIN PROX 35W (STAPLE) IMPLANT
STOCKINETTE IMPERVIOUS 9X36 MD (GAUZE/BANDAGES/DRESSINGS) ×2 IMPLANT
SUT ETHILON 2 0 FSLX (SUTURE) IMPLANT
SUT ETHILON 4 0 PS 2 18 (SUTURE) ×2 IMPLANT
SUT ETHILON NAB PS2 4-0 18IN (SUTURE) IMPLANT
SUT PDS AB 2-0 CT1 27 (SUTURE) IMPLANT
SUT VIC AB 1 CT1 36 (SUTURE) IMPLANT
SUT VIC AB 2-0 CT1 27 (SUTURE)
SUT VIC AB 2-0 CT1 TAPERPNT 27 (SUTURE) IMPLANT
SUT VICRYL AB 3-0 FS1 BRD 27IN (SUTURE) IMPLANT
TOWEL OR 17X26 4PK STRL BLUE (TOWEL DISPOSABLE) ×2 IMPLANT

## 2020-06-07 NOTE — Progress Notes (Signed)
Upon return from OR, secondary IV that was started by OR had infiltrated. IV was removed. Beside RN gave PRN medication through remaining IV. Soon thereafter, the last IV also to infiltrated. Dr. Chipper Herb aware patient does not have an IV. IV team will not be able to start new PICC line till tomorrow.   Madie Reno, RN

## 2020-06-07 NOTE — Progress Notes (Signed)
   06/07/20 1750  Assess: MEWS Score  Temp 98 F (36.7 C)  BP (!) 202/98 (bedside RN aware and at bedside. PRN orders for hydralizine)  Pulse Rate 90  Resp 12  Level of Consciousness Alert  SpO2 97 %  O2 Device Room Air  Patient Activity (if Appropriate) In bed (just returned from OR)  Assess: MEWS Score  MEWS Temp 0  MEWS Systolic 2  MEWS Pulse 0  MEWS RR 1  MEWS LOC 0  MEWS Score 3  MEWS Score Color Yellow  Assess: if the MEWS score is Yellow or Red  Were vital signs taken at a resting state? Yes  Focused Assessment Change from prior assessment (see assessment flowsheet)  Early Detection of Sepsis Score *See Row Information* Low  MEWS guidelines implemented *See Row Information* Yes  Treat  MEWS Interventions Administered prn meds/treatments  Pain Scale 0-10  Pain Score 0  Take Vital Signs  Increase Vital Sign Frequency  Yellow: Q 2hr X 2 then Q 4hr X 2, if remains yellow, continue Q 4hrs  Escalate  MEWS: Escalate Yellow: discuss with charge nurse/RN and consider discussing with provider and RRT  Notify: Charge Nurse/RN  Name of Charge Nurse/RN Notified Thompson Caul RN  Date Charge Nurse/RN Notified 06/07/20  Time Charge Nurse/RN Notified 1830  Notify: Provider  Provider Name/Title Dr. Marrion Coy  Date Provider Notified 06/07/20  Time Provider Notified 1800  Notification Type Call  Notification Reason Change in status  Provider response No new orders  Date of Provider Response 06/07/20  Time of Provider Response 1805  Document  Patient Outcome Stabilized after interventions  Progress note created (see row info) Yes

## 2020-06-07 NOTE — Progress Notes (Signed)
*  PRELIMINARY RESULTS* Echocardiogram 2D Echocardiogram has been performed.  Gregory Small 06/07/2020, 11:53 AM

## 2020-06-07 NOTE — Transfer of Care (Signed)
Immediate Anesthesia Transfer of Care Note  Patient: JAKHAI FANT  Procedure(s) Performed: IRRIGATION AND DEBRIDEMENT WOUND-Right Hand and Wrist (Right Hand)  Patient Location: inpatient unit  Anesthesia Type:General  Level of Consciousness: awake, alert  and oriented  Airway & Oxygen Therapy: Patient Spontanous Breathing  Post-op Assessment: Report given to RN and Post -op Vital signs reviewed and stable  Post vital signs: Reviewed and stable  Last Vitals:  Vitals Value Taken Time  BP 202/98 06/07/20 1750  Temp    Pulse 90 06/07/20 1750  Resp 12 06/07/20 1750  SpO2 97 % 06/07/20 1750    Last Pain:  Vitals:   06/07/20 1750  TempSrc:   PainSc: 0-No pain         Complications: No complications documented.

## 2020-06-07 NOTE — Progress Notes (Signed)
Physical Therapy Treatment Patient Details Name: Gregory Small MRN: [id number] DOB: May 14, 1919 Today's Date: 06/07/2020    History of Present Illness 86 yo male with onset of RUE cellulitis from unknown source was brought to ED, after a fall at his ALF which was unwitnessed.  Pt is having some joint pain with no diagnosed injuries.  Noted sepsis, current Covid infection, falls.  PMHx:  recent Covid dx, myasthenia gravis, vertigo, a-fib, GERD, hypothyroidism, HTN, chronic pain, spinal surgery with DJD Lumbar spine, falls, CKD 3a    PT Comments    Patient received in bed, he reports he has little energy today, but is agreeable to PT session. Patient requires min assist for supine to sit. Min assist for sit to stand from slightly elevated bed. Min guard with RW for ambulation of 25 feet. Patient making good progress toward goals. Will continue to benefit from skilled PT while here to improve functional independence and hopeful return to ALF upon discharge.       Follow Up Recommendations  Home health PT;Supervision - Intermittent     Equipment Recommendations  None recommended by PT    Recommendations for Other Services       Precautions / Restrictions Precautions Precautions: Fall Restrictions Weight Bearing Restrictions: No    Mobility  Bed Mobility Overal bed mobility: Needs Assistance Bed Mobility: Supine to Sit;Sit to Supine     Supine to sit: Min assist Sit to supine: Supervision   General bed mobility comments: Patient requires min assist to raise trunk to seated position, he was able to bring LEs back up onto bed at end of session with increased effort.    Transfers Overall transfer level: Needs assistance Equipment used: Rolling walker (2 wheeled) Transfers: Sit to/from Stand Sit to Stand: From elevated surface;Min guard            Ambulation/Gait Ambulation/Gait assistance: Min guard Gait Distance (Feet): 25 Feet Assistive device: Rolling walker (2  wheeled) Gait Pattern/deviations: Step-through pattern;Trunk flexed;Decreased step length - right;Decreased step length - left;Shuffle Gait velocity: decreased   General Gait Details: Patient ambulated to room door and back to bed. Fatigued, slightly sob.   Stairs             Wheelchair Mobility    Modified Rankin (Stroke Patients Only)       Balance Overall balance assessment: Needs assistance Sitting-balance support: Feet supported Sitting balance-Leahy Scale: Good     Standing balance support: Bilateral upper extremity supported;During functional activity Standing balance-Leahy Scale: Fair Standing balance comment: patient with good static standing balance, reports dizziness with initial standing. B UE support with gait and min guard.                            Cognition Arousal/Alertness: Awake/alert Behavior During Therapy: WFL for tasks assessed/performed Overall Cognitive Status: Within Functional Limits for tasks assessed                                        Exercises      General Comments        Pertinent Vitals/Pain Pain Assessment: Faces Faces Pain Scale: Hurts a little bit Pain Location: R UE Pain Descriptors / Indicators: Discomfort Pain Intervention(s): Monitored during session    Home Living  Prior Function            PT Goals (current goals can now be found in the care plan section) Acute Rehab PT Goals Patient Stated Goal: to return to ALF PT Goal Formulation: With patient Time For Goal Achievement: 06/19/20 Potential to Achieve Goals: Good Progress towards PT goals: Progressing toward goals    Frequency    Min 2X/week      PT Plan Current plan remains appropriate    Co-evaluation              AM-PAC PT "6 Clicks" Mobility   Outcome Measure  Help needed turning from your back to your side while in a flat bed without using bedrails?: A Little Help needed  moving from lying on your back to sitting on the side of a flat bed without using bedrails?: A Lot Help needed moving to and from a bed to a chair (including a wheelchair)?: A Little Help needed standing up from a chair using your arms (e.g., wheelchair or bedside chair)?: A Lot Help needed to walk in hospital room?: A Little Help needed climbing 3-5 steps with a railing? : A Lot 6 Click Score: 15    End of Session Equipment Utilized During Treatment: Gait belt Activity Tolerance: Patient limited by fatigue Patient left: in bed;with call bell/phone within reach;with bed alarm set Nurse Communication: Mobility status PT Visit Diagnosis: Muscle weakness (generalized) (M62.81);Difficulty in walking, not elsewhere classified (R26.2);History of falling (Z91.81)     Time: 9798-9211 PT Time Calculation (min) (ACUTE ONLY): 25 min  Charges:  $Gait Training: 23-37 mins                     [employer], PT, GCS 06/07/20,3:28 PM

## 2020-06-07 NOTE — Progress Notes (Signed)
Spoke with Primary RN Misty re: PICC order. Patient is in OR at this time. PICC order received and will be done tomorrow 4/12. Will follow up.

## 2020-06-07 NOTE — Consult Note (Signed)
Pharmacy Antibiotic Note  Gregory Small is a 87 y.o. male admitted on 06/04/2020 with cellulitis. Patient initiated on broad spectrum coverage with vancomycin and ceftriaxone. Blood cultures have now resulted GPC in 1/4 bottles to date. BCID detected Staphylococcus aureus. No resistance genes detected. Pharmacy has been consulted for cefazolin dosing. ID has been consulted.  4/11 IV access lost. Unable to place PICC line until 4/12 AM, after discussion with provider. Transition to oral agent linezolid for 1 day until IV access restored. Then back to cefazolid   Plan: linezolid 600 mg PO  X 2 doses  Cefazolin 2 g IV q8h  Continue to monitor renal function and adjust antibiotics as indicated. Continue to follow-up cultures.  Height: 5\' 7"  (170.2 cm) Weight: 84.9 kg (187 lb 1.6 oz) IBW/kg (Calculated) : 66.1  Temp (24hrs), Avg:97.4 F (36.3 C), Min:97.4 F (36.3 C), Max:97.4 F (36.3 C)  Recent Labs  Lab 06/04/20 0939 06/04/20 1129 06/05/20 0524 06/06/20 0455 06/07/20 0440  WBC 15.8*  --  13.8*  --   --   CREATININE 1.48*  --  1.03 0.89 0.92  LATICACIDVEN  --  1.5  --   --   --     Estimated Creatinine Clearance: 46.7 mL/min (by C-G formula based on SCr of 0.92 mg/dL).    Allergies  Allergen Reactions  . Simvastatin Other (See Comments)    Myalgias   . Sulfa Antibiotics     Antimicrobials this admission: Cefepime 4/8 x 1 Vancomycin 4/8 x 1 Ceftriaxone 4/9 x 1 Cefazolin 4/9 >>  Dose adjustments this admission: n/a  Microbiology results: 4/8 BCx: 1/4 bottles GPC. BCID detected SA no resistance genes 4/8 SARS-CoV-2: (+)  Thank you for allowing pharmacy to be a part of this patient's care.  6/8 06/07/2020 6:30 PM

## 2020-06-07 NOTE — Op Note (Addendum)
06/07/2020  5:34 PM  PATIENT:  Gregory Small    PRE-OPERATIVE DIAGNOSIS:  Right Hand Infection  POST-OPERATIVE DIAGNOSIS:  Same  PROCEDURE:  IRRIGATION AND DEBRIDEMENT WOUND-Right Hand and Wrist  SURGEON:  Lyndle Herrlich, MD  ASSIST: none  ANESTHESIA:   General  SPECIMENS: deep abscess cultures from the dorsal wrist and dorsal hand  EBL: less than 5 mL   TOURNIQUET TIME: 9 MIN at 250 mmHg  PREOPERATIVE INDICATIONS:  [name] is a  87 y.o. male who failed conservative measures and elected for surgical management of the deep infection.   The risks benefits and alternatives were discussed with the patient preoperatively including but not limited to the risks of infection, bleeding, nerve injury, incomplete relief of symptoms, pillar pain, cardiopulmonary complications, the need for revision surgery, among others, and the patient was willing to proceed.  OPERATIVE FINDINGS: Warmth, erythema, swelling and purulent drainage  OPERATIVE PROCEDURE:   After informed consent was obtained and the appropriate extremity marked in the pre-operative holding area, the patient was taken to the operating room and placed in the supine position. General anesthesia was induced and the upper extremity was prepped and draped in standard sterile fashion. The extremity was elevated and the tourniquet insufflated to 250 mmHg. An incision was made proximal and distal from infected area. Sharp dissection with the scalpel and iris scissor was utilized to debride the skin edges. Necrotic tissue was sharply debrided with the iris scissors. Care was taken to protect the tendinous and nervous structures. Cultures were obtained. This was performed at both the dorsal hand wounds and the dorsal wrist swelling. The wound was irrigated with more than 3 liters of bacitracin laced normal saline. The skin was closed loosely with 4-0 nylon. A sterile dressing was applied followed by a volar resting  splint.   Cassell Smiles, MD

## 2020-06-07 NOTE — Anesthesia Procedure Notes (Signed)
Procedure Name: LMA Insertion Date/Time: 06/07/2020 4:47 PM Performed by: Junious Silk, CRNA Pre-anesthesia Checklist: Patient identified, Patient being monitored, Timeout performed, Emergency Drugs available and Suction available Patient Re-evaluated:Patient Re-evaluated prior to induction Oxygen Delivery Method: Circle system utilized Preoxygenation: Pre-oxygenation with 100% oxygen Induction Type: IV induction Ventilation: Mask ventilation without difficulty LMA: LMA inserted LMA Size: 4.0 Tube type: Oral Number of attempts: 1 Placement Confirmation: positive ETCO2 and breath sounds checked- equal and bilateral Tube secured with: Tape Dental Injury: Teeth and Oropharynx as per pre-operative assessment

## 2020-06-07 NOTE — TOC Progression Note (Signed)
Transition of Care [facility]) - Progression Note    Patient Details  Name: Gregory Small MRN: [id number] Date of Birth: 1919/12/20  [facility] CM/SW Contact  Chapman Fitch, RN Phone Number: 06/07/2020, 2:04 PM  Clinical Narrative:    Patient and daughter hopeful that patient will improve with therapy and not require SNF at discharge  Daughter request that I call -[facility] in [place] - spoke with [place], they are not accepting any patients that are covid positive - [facility] in [place] - spoke with Elsie.  They are only reviewing patients from their "4 local hospitals" and would not be able to offer at this time - [facility] in [place].  Spoke with Melissa.  Referral faxed to [phone].  She is to review referral - [facility] in [place] - [place] left for admissions coordinator Elease Hashimoto [phone]  Bed Offer reviewed from [facility].  Due to patient being on immunosuppressive therapy they would only be able to accept patient 20 days out from date of positive test.   Daughter updated with this information Patient for I&D today PT to see patient again    Expected Discharge Plan: Skilled Nursing Facility Barriers to Discharge: Continued Medical Work up  Expected Discharge Plan and Services Expected Discharge Plan: Skilled Nursing Facility       Living arrangements for the past 2 months: Assisted Living Facility                                       Social Determinants of Health (SDOH) Interventions    Readmission Risk Interventions Readmission Risk Prevention Plan 06/06/2020  Transportation Screening Complete  PCP or Specialist Appt within 3-5 Days Complete  HRI or Home Care Consult Complete  Social Work Consult for Recovery Care Planning/Counseling Complete  Palliative Care Screening Not Applicable  Medication Review Oceanographer) Complete  Some recent data might be hidden

## 2020-06-07 NOTE — Anesthesia Preprocedure Evaluation (Signed)
Anesthesia Evaluation  Patient identified by MRN, date of birth, ID band Patient awake    Reviewed: Allergy & Precautions, H&P , NPO status , Patient's Chart, lab work & pertinent test results, reviewed documented beta blocker date and time   Airway Mallampati: II  TM Distance: >3 FB Neck ROM: full    Dental  (+) Teeth Intact   Pulmonary neg pulmonary ROS,    Pulmonary exam normal        Cardiovascular Exercise Tolerance: Poor hypertension, On Medications negative cardio ROS Normal cardiovascular exam Rate:Normal     Neuro/Psych  Neuromuscular disease negative psych ROS   GI/Hepatic Neg liver ROS, GERD  Medicated,  Endo/Other  Hypothyroidism   Renal/GU Renal disease  negative genitourinary   Musculoskeletal   Abdominal   Peds  Hematology negative hematology ROS (+)   Anesthesia Other Findings   Reproductive/Obstetrics negative OB ROS                             Anesthesia Physical Anesthesia Plan  ASA: III  Anesthesia Plan: General LMA   Post-op Pain Management:    Induction:   PONV Risk Score and Plan: 3  Airway Management Planned:   Additional Equipment:   Intra-op Plan:   Post-operative Plan:   Informed Consent: I have reviewed the patients History and Physical, chart, labs and discussed the procedure including the risks, benefits and alternatives for the proposed anesthesia with the patient or authorized representative who has indicated his/her understanding and acceptance.       Plan Discussed with: CRNA  Anesthesia Plan Comments:         Anesthesia Quick Evaluation

## 2020-06-07 NOTE — Progress Notes (Signed)
PROGRESS NOTE    Gregory Small  JQZ:[id number] DOB: 1919-08-21 DOA: 06/04/2020 PCP: [facility]   CC; right hand swelling Brief Narrative:  Gregory Small a 87 y.o.malewith medical history significant ofhypertension, GERD, hypothyroidism, vertigo, atrial fibrillation on anticoagulants, chronic pain syndrome, CKD stage IIIa,myasthenia gravis after receiving 2 doses of COVID vaccine,who presents with a fall, pain in right hip and right knee, right arm pain and redness. Upon arriving the hospital, he was Covid positive. Right hand was swelling and tender. He was diagnosed with cellulitis of right hand, start vancomycin and Rocephin. Blood culture came back with MSSA, antibiotic switched to cefazolin.   Assessment & Plan:   Principal Problem:   Cellulitis of right upper arm Active Problems:   Myasthenia gravis (HCC)   Atrial fibrillation, chronic (HCC)   Hypothyroidism   Chronic pain syndrome   Fall   CKD (chronic kidney disease), stage IIIa   Sepsis (HCC)   HTN (hypertension)   GERD (gastroesophageal reflux disease)   COVID-19 virus infection   MSSA (methicillin susceptible Staphylococcus aureus) septicemia (HCC)  #1. Sepsis with MSSA septicemia secondary to right arm cellulitis Right arm cellulitis with small right hand abscess. Blood culture grow MSSA the anaerobic bottle.  Repeat culture came back negative.  Echocardiogram still pending. Wound had some drainage yesterday, culture sent out.  Patient is scheduled to have I&D today in the OR. Continue cefazolin.  2.  Myasthenia gravis. Stable.  3.  Covid infection. Still asymptomatic.  4.  Acute kidney injury. Patient renal function has improved to normal, therefore, patient does not have a chronic kidney disease.  5.  Chronic atrial fibrillation. No tachycardia.    DVT prophylaxis: Lovenox Code Status: DNR Family Communication: daughter updated Disposition Plan:  .   Status is:  Inpatient  Remains inpatient appropriate because:Inpatient level of care appropriate due to severity of illness   Dispo: The patient is from: ALF              Anticipated d/c is to: ALF vs SNF              Patient currently is not medically stable to d/c.   Difficult to place patient No  PT/OT recommended nursing home placement.  Daughter prefer patient to go back to assisted living facility.  Discussed with Child psychotherapist, will reassess patient by PT OT tomorrow to determine the next course of action.      I/O last 3 completed shifts: In: -  Out: 100 [Urine:100] Total I/O In: 0  Out: 125 [Urine:125]     Consultants:   orthopedics  Procedures: none  Antimicrobials: cefazolin  Subjective: Patient right hand abscess drained spontaneously, pain is better. No fever chills No abdominal pain or nausea vomiting No short of breath or cough. No dysuria hematuria.  Objective: Vitals:   06/06/20 1238 06/06/20 1640 06/06/20 2027 06/07/20 0901  BP: 140/80 (!) 142/69 (!) 154/71 (!) 157/91  Pulse: 65 62 (!) 52 (!) 54  Resp: 18 18 18 16   Temp: 97.7 F (36.5 C) 98.2 F (36.8 C) (!) 97.4 F (36.3 C) (!) 97.4 F (36.3 C)  TempSrc:   Oral   SpO2: 98% 98% 96% 100%  Weight:      Height:        Intake/Output Summary (Last 24 hours) at 06/07/2020 1245 Last data filed at 06/07/2020 0900 Gross per 24 hour  Intake 0 ml  Output 225 ml  Net -225 ml   08/07/2020  06/04/20 0924  Weight: 84.9 kg    Examination:  General exam: Appears calm and comfortable  Respiratory system: Clear to auscultation. Respiratory effort normal. Cardiovascular system: S1 & S2 heard, RRR. No JVD, murmurs, rubs, gallops or clicks. No pedal edema. Gastrointestinal system: Abdomen is nondistended, soft and nontender. No organomegaly or masses felt. Normal bowel sounds heard. Central nervous system: Alert and oriented x2. No focal neurological deficits. Extremities: Right hand abscess. Skin: No  rashes, lesions or ulcers Psychiatry: Mood & affect appropriate.     Data Reviewed: I have personally reviewed following labs and imaging studies  CBC: Recent Labs  Lab 06/04/20 0939 06/05/20 0524  WBC 15.8* 13.8*  NEUTROABS 12.9*  --   HGB 12.4* 11.2*  HCT 39.5 35.3*  MCV 97.1 97.2  PLT 186 169   Basic Metabolic Panel: Recent Labs  Lab 06/04/20 0939 06/05/20 0524 06/06/20 0455 06/07/20 0440  NA 137 138 139 139  K 4.9 3.6 3.9 3.9  CL 100 104 105 103  CO2 28 26 27 28   GLUCOSE 118* 92 120* 115*  BUN 23 23 25* 24*  CREATININE 1.48* 1.03 0.89 0.92  CALCIUM 8.9 8.5* 8.4* 8.4*   GFR: Estimated Creatinine Clearance: 46.7 mL/min (by C-G formula based on SCr of 0.92 mg/dL). Liver Function Tests: Recent Labs  Lab 06/04/20 0939  AST 48*  ALT 21  ALKPHOS 55  BILITOT 1.9*  PROT 6.8  ALBUMIN 3.4*   No results for input(s): LIPASE, AMYLASE in the last 168 hours. No results for input(s): AMMONIA in the last 168 hours. Coagulation Profile: No results for input(s): INR, PROTIME in the last 168 hours. Cardiac Enzymes: No results for input(s): CKTOTAL, CKMB, CKMBINDEX, TROPONINI in the last 168 hours. BNP (last 3 results) No results for input(s): PROBNP in the last 8760 hours. HbA1C: No results for input(s): HGBA1C in the last 72 hours. CBG: No results for input(s): GLUCAP in the last 168 hours. Lipid Profile: No results for input(s): CHOL, HDL, LDLCALC, TRIG, CHOLHDL, LDLDIRECT in the last 72 hours. Thyroid Function Tests: No results for input(s): TSH, T4TOTAL, FREET4, T3FREE, THYROIDAB in the last 72 hours. Anemia Panel: No results for input(s): VITAMINB12, FOLATE, FERRITIN, TIBC, IRON, RETICCTPCT in the last 72 hours. Sepsis Labs: Recent Labs  Lab 06/04/20 1129 06/04/20 1140  PROCALCITON  --  1.12  LATICACIDVEN 1.5  --     Recent Results (from the past 240 hour(s))  Culture, blood (routine x 2)     Status: Abnormal   Collection Time: 06/04/20 11:40 AM    Specimen: BLOOD RIGHT ARM  Result Value Ref Range Status   Specimen Description   Final    BLOOD RIGHT ARM Performed at [facility], [address] [place], [place] [address]    Special Requests   Final    BAA Blood Culture adequate volume Performed at [facility], [address] [place], [place] [address]    Culture  Setup Time   Final    GRAM POSITIVE COCCI ANAEROBIC BOTTLE ONLY Organism ID to follow CRITICAL RESULT CALLED TO, READ BACK BY AND VERIFIED WITH: A.CHAPPEL,PHARMD AT [id number] ON 06/05/20 BY GM Performed at [facility], [address] [place], [place] [address]    Culture STAPHYLOCOCCUS AUREUS (A)  Final   Report Status 06/07/2020 FINAL  Final   Organism ID, Bacteria STAPHYLOCOCCUS AUREUS  Final      Susceptibility   Staphylococcus aureus - MIC*    CIPROFLOXACIN <=0.5 SENSITIVE Sensitive  ERYTHROMYCIN <=0.25 SENSITIVE Sensitive     GENTAMICIN <=0.5 SENSITIVE Sensitive     OXACILLIN 0.5 SENSITIVE Sensitive     TETRACYCLINE <=1 SENSITIVE Sensitive     VANCOMYCIN <=0.5 SENSITIVE Sensitive     TRIMETH/SULFA <=10 SENSITIVE Sensitive     CLINDAMYCIN <=0.25 SENSITIVE Sensitive     RIFAMPIN <=0.5 SENSITIVE Sensitive     Inducible Clindamycin NEGATIVE Sensitive     * STAPHYLOCOCCUS AUREUS  Blood Culture ID Panel (Reflexed)     Status: Abnormal   Collection Time: 06/04/20 11:40 AM  Result Value Ref Range Status   Enterococcus faecalis NOT DETECTED NOT DETECTED Final   Enterococcus Faecium NOT DETECTED NOT DETECTED Final   Listeria monocytogenes NOT DETECTED NOT DETECTED Final   Staphylococcus species DETECTED (A) NOT DETECTED Final    Comment: CRITICAL RESULT CALLED TO, READ BACK BY AND VERIFIED WITH: A.CHAPPEL,PHARMD AT 0917 ON 06/05/20 BY GM    Staphylococcus aureus (BCID) DETECTED (A) NOT DETECTED Final    Comment: CRITICAL RESULT CALLED TO, READ BACK BY AND VERIFIED WITH: A.CHAPPEL,PHARMD AT 0917 ON 06/05/20 BY GM    Staphylococcus  epidermidis NOT DETECTED NOT DETECTED Final   Staphylococcus lugdunensis NOT DETECTED NOT DETECTED Final   Streptococcus species NOT DETECTED NOT DETECTED Final   Streptococcus agalactiae NOT DETECTED NOT DETECTED Final   Streptococcus pneumoniae NOT DETECTED NOT DETECTED Final   Streptococcus pyogenes NOT DETECTED NOT DETECTED Final   A.calcoaceticus-baumannii NOT DETECTED NOT DETECTED Final   Bacteroides fragilis NOT DETECTED NOT DETECTED Final   Enterobacterales NOT DETECTED NOT DETECTED Final   Enterobacter cloacae complex NOT DETECTED NOT DETECTED Final   Escherichia coli NOT DETECTED NOT DETECTED Final   Klebsiella aerogenes NOT DETECTED NOT DETECTED Final   Klebsiella oxytoca NOT DETECTED NOT DETECTED Final   Klebsiella pneumoniae NOT DETECTED NOT DETECTED Final   Proteus species NOT DETECTED NOT DETECTED Final   Salmonella species NOT DETECTED NOT DETECTED Final   Serratia marcescens NOT DETECTED NOT DETECTED Final   Haemophilus influenzae NOT DETECTED NOT DETECTED Final   Neisseria meningitidis NOT DETECTED NOT DETECTED Final   Pseudomonas aeruginosa NOT DETECTED NOT DETECTED Final   Stenotrophomonas maltophilia NOT DETECTED NOT DETECTED Final   Candida albicans NOT DETECTED NOT DETECTED Final   Candida auris NOT DETECTED NOT DETECTED Final   Candida glabrata NOT DETECTED NOT DETECTED Final   Candida krusei NOT DETECTED NOT DETECTED Final   Candida parapsilosis NOT DETECTED NOT DETECTED Final   Candida tropicalis NOT DETECTED NOT DETECTED Final   Cryptococcus neoformans/gattii NOT DETECTED NOT DETECTED Final   Meth resistant mecA/C and MREJ NOT DETECTED NOT DETECTED Final    Comment: Performed at [facility], [address] [place], [place] [address]  SARS CORONAVIRUS 2 (TAT 6-24 HRS) Nasopharyngeal Nasopharyngeal Swab     Status: Abnormal   Collection Time: 06/04/20 11:48 AM   Specimen: Nasopharyngeal Swab  Result Value Ref Range Status   SARS Coronavirus 2  POSITIVE (A) NEGATIVE Final    Comment: (NOTE) SARS-CoV-2 target nucleic acids are DETECTED.  The SARS-CoV-2 RNA is generally detectable in upper and lower respiratory specimens during the acute phase of infection. Positive results are indicative of the presence of SARS-CoV-2 RNA. Clinical correlation with patient history and other diagnostic information is  necessary to determine patient infection status. Positive results do not rule out bacterial infection or co-infection with other viruses.  The expected result is Negative.  Fact Sheet for Patients: HairSlick.no  Fact Sheet for  Healthcare Providers: quierodirigir.com  This test is not yet approved or cleared by the [place] and  has been authorized for detection and/or diagnosis of SARS-CoV-2 by FDA under an Emergency Use Authorization (EUA). This EUA will remain  in effect (meaning this test can be used) for the duration of the COVID-19 declaration under Section 564(b)(1) of the Act, 21 U. S.C. section 360bbb-3(b)(1), unless the authorization is terminated or revoked sooner.   Performed at [facility], [address], [place], [place] [address]   Culture, blood (Routine X 2) w Reflex to ID Panel     Status: None (Preliminary result)   Collection Time: 06/05/20 10:55 AM   Specimen: BLOOD LEFT HAND  Result Value Ref Range Status   Specimen Description BLOOD LEFT HAND  Final   Special Requests   Final    BOTTLES DRAWN AEROBIC AND ANAEROBIC Blood Culture adequate volume   Culture   Final    NO GROWTH 2 DAYS Performed at [facility], [address], [place], [place] [address]    Report Status PENDING  Incomplete  Culture, blood (Routine X 2) w Reflex to ID Panel     Status: None (Preliminary result)   Collection Time: 06/05/20 11:51 AM   Specimen: BLOOD  Result Value Ref Range Status   Specimen Description BLOOD BLOOD LEFT HAND  Final    Special Requests   Final    BOTTLES DRAWN AEROBIC AND ANAEROBIC Blood Culture adequate volume   Culture   Final    NO GROWTH 2 DAYS Performed at [facility], [address], [place], [place] [address]    Report Status PENDING  Incomplete  Aerobic/Anaerobic Culture w Gram Stain (surgical/deep wound)     Status: None (Preliminary result)   Collection Time: 06/06/20  5:22 PM   Specimen: Hand; Abscess  Result Value Ref Range Status   Specimen Description   Final    HAND Performed at [facility], [address], [place], [place] [address]    Special Requests   Final    NONE Performed at [facility], [address] [place], [place] [address]    Gram Stain PENDING  Incomplete   Culture   Final    TOO YOUNG TO READ Performed at [facility], [address], [place], [place] [address]    Report Status PENDING  Incomplete         Radiology Studies: MR HAND RIGHT WO CONTRAST  Result Date: 06/06/2020 CLINICAL DATA:  Right hand pain and swelling.  Bacteremia. EXAM: MRI OF THE RIGHT HAND WITHOUT CONTRAST TECHNIQUE: Multiplanar, multisequence MR imaging of the right hand was performed. No intravenous contrast was administered. COMPARISON:  Right hand x-rays dated June 05, 2020. FINDINGS: Despite efforts by the technologist and patient, motion artifact is present on today's exam and could not be eliminated. This reduces exam sensitivity and specificity. Bones/Joint/Cartilage No suspicious marrow signal abnormality. No fracture or dislocation. Mild osteoarthritis of the wrist, MCP joints, and IP joints. Large cyst in the lunate. Small midcarpal joint effusion. Ligaments Collateral ligaments are intact. Muscles and Tendons Flexor and extensor tendons are intact. There is prominent soft tissue edema around the extensor tendons but not within the tendon sheaths themselves. Soft tissue Diffuse soft tissue swelling, most severe over the dorsal hand. No fluid  collection or hematoma. No soft tissue mass. IMPRESSION: 1. Diffuse soft tissue swelling, most severe over the dorsal hand, consistent with reported history of  cellulitis. No abscess. 2. No evidence of osteomyelitis. 3. No tenosynovitis. Prominent soft tissue edema around the extensor tendons but not within the tendon sheaths themselves. Electronically Signed   By: Obie Dredge M.D.   On: 06/06/2020 09:31        Scheduled Meds: . brimonidine  1 drop Both Eyes BID  . calcium carbonate  600 mg of elemental calcium Oral Q breakfast  . calcium-vitamin D  1 tablet Oral Daily  . enoxaparin (LOVENOX) injection  40 mg Subcutaneous Q24H  . hydrocortisone sod succinate (SOLU-CORTEF) inj  50 mg Intravenous Q8H  . levothyroxine  88 mcg Oral QAC breakfast  . pantoprazole  40 mg Oral Daily  . pyridostigmine  60 mg Oral TID  . timolol  1 drop Both Eyes BID   Continuous Infusions: .  ceFAZolin (ANCEF) IV 2 g (06/07/20 0534)     LOS: 3 days    Time spent: 28 minutes    Marrion Coy, MD [facility]   To contact the attending provider between 7A-7P or the covering provider during after hours 7P-7A, please log into the web site [email or web address] and access using universal [place] password for that web site. If you do not have the password, please call the hospital operator.  06/07/2020, 12:45 PM

## 2020-06-07 NOTE — Consult Note (Signed)
Infectious Disease     Reason for Consult: MSSA bacteremia, hand infection     Referring Physician: Sharen Hones Date of Admission:  06/04/2020   Principal Problem:   Cellulitis of right upper arm Active Problems:   Myasthenia gravis ([place])   Atrial fibrillation, chronic (HCC)   Hypothyroidism   Chronic pain syndrome   Fall   CKD (chronic kidney disease), stage IIIa   Sepsis ([place])   HTN (hypertension)   GERD (gastroesophageal reflux disease)   COVID-19 virus infection   MSSA (methicillin susceptible Staphylococcus aureus) septicemia ([place])   HPI: Gregory Small is a 87 y.o. male admitted April 8 after a fall.  He was also having pain in right arm for the few days prior.  He was noted to have swelling in the arm.  Patient had also tested positive for Covid the day prior and had a mild cough and shortness of breath.  On admission white count was 15.8.  Chest x-ray negative.  CT of the head negative. He was noted to have sepsis due to the right upper extremity cellulitis.  was started on vancomycin and Rocephin.  Blood cultures turned positive for methicillin sensitive staph aureus.  Of note he is on prednisone 20 mg daily for myasthenia gravis. She underwent MRI of her hand which showed diffuse soft tissue swelling most severe over the dorsal hand consistent with a cellulitis but no abscess.  There was no evidence of osteomyelitis or tenosynovitis.  But there was prominent soft tissue edema around the extensor tendons but not within the tendon sheaths.  He has been narrowed to cefazolin since April 9.  White count came down a little to 13.8.  He has had no fevers.  He was seen by orthopedics and hand was decompressed at the bedside and a bandage placed.  Plan is to monitor and I&D if needed.  Follow-up blood cultures are negative from April 9 TTE pending  Past Medical History:  Diagnosis Date  . Hypertension   . Hypothyroidism   . Myasthenia gravis [facility])    Past Surgical History:   Procedure Laterality Date  . BACK SURGERY     Social History   Tobacco Use  . Smoking status: Never Smoker  Vaping Use  . Vaping Use: Never used  Substance Use Topics  . Alcohol use: Not Currently  . Drug use: Never   Family History  Problem Relation Age of Onset  . Cataracts Daughter     Allergies:  Allergies  Allergen Reactions  . Simvastatin Other (See Comments)    Myalgias   . Sulfa Antibiotics     Current antibiotics: Antibiotics Given (last 72 hours)    Date/Time Action Medication Dose Rate   06/04/20 1509 New Bag/Given   vancomycin (VANCOCIN) IVPB 1000 mg/200 mL premix 1,000 mg 200 mL/hr   06/05/20 0911 New Bag/Given   cefTRIAXone (ROCEPHIN) 2 g in sodium chloride 0.9 % 100 mL IVPB 2 g 200 mL/hr   06/05/20 1449 New Bag/Given   ceFAZolin (ANCEF) IVPB 2g/100 mL premix 2 g 200 mL/hr   06/05/20 2117 New Bag/Given   ceFAZolin (ANCEF) IVPB 2g/100 mL premix 2 g 200 mL/hr   06/06/20 0631 New Bag/Given   ceFAZolin (ANCEF) IVPB 2g/100 mL premix 2 g 200 mL/hr   06/06/20 1411 New Bag/Given   ceFAZolin (ANCEF) IVPB 2g/100 mL premix 2 g 200 mL/hr   06/06/20 2141 New Bag/Given   ceFAZolin (ANCEF) IVPB 2g/100 mL premix 2 g 200 mL/hr  06/07/20 0534 New Bag/Given   ceFAZolin (ANCEF) IVPB 2g/100 mL premix 2 g 200 mL/hr      MEDICATIONS: . brimonidine  1 drop Both Eyes BID  . calcium carbonate  600 mg of elemental calcium Oral Q breakfast  . calcium-vitamin D  1 tablet Oral Daily  . enoxaparin (LOVENOX) injection  40 mg Subcutaneous Q24H  . hydrocortisone sod succinate (SOLU-CORTEF) inj  50 mg Intravenous Q8H  . levothyroxine  88 mcg Oral QAC breakfast  . pantoprazole  40 mg Oral Daily  . pyridostigmine  60 mg Oral TID  . timolol  1 drop Both Eyes BID    Review of Systems - 11 systems reviewed and negative per HPI   OBJECTIVE: Temp:  [97.4 F (36.3 C)-98.2 F (36.8 C)] 97.4 F (36.3 C) (04/11 0901) Pulse Rate:  [52-62] 54 (04/11 0901) Resp:  [16-18] 16  (04/11 0901) BP: (142-157)/(69-91) 157/91 (04/11 0901) SpO2:  [96 %-100 %] 100 % (04/11 0901) Physical Exam  Constitutional: He is oriented to person, place, and time. HENT: anicteric Mouth/Throat: Oropharynx is clear and moist. No oropharyngeal exudate.  Cardiovascular: Normal rate, regular rhythm and normal heart sounds. Pulmonary/Chest: Effort normal and breath sounds normal. No respiratory distress. He has no wheezes.  Abdominal: Soft. Bowel sounds are normal. He exhibits no distension. There is no tenderness.  Lymphadenopathy: He has no cervical adenopathy.  Neurological: He is alert and oriented to person, place, and time.  Ext R wrist with pain with passive motion Skin:R dorsum hand and wrist with erythema, ttp. Psychiatric: He has a normal mood and affect. His behavior is normal.   LABS: Results for orders placed or performed during the hospital encounter of 06/04/20 (from the past 48 hour(s))  Basic metabolic panel     Status: Abnormal   Collection Time: 06/06/20  4:55 AM  Result Value Ref Range   Sodium 139 135 - 145 mmol/L   Potassium 3.9 3.5 - 5.1 mmol/L   Chloride 105 98 - 111 mmol/L   CO2 27 22 - 32 mmol/L   Glucose, Bld 120 (H) 70 - 99 mg/dL    Comment: Glucose reference range applies only to samples taken after fasting for at least 8 hours.   BUN 25 (H) 8 - 23 mg/dL   Creatinine, Ser 0.89 0.61 - 1.24 mg/dL   Calcium 8.4 (L) 8.9 - 10.3 mg/dL   GFR, Estimated >60 >60 mL/min    Comment: (NOTE) Calculated using the CKD-EPI Creatinine Equation (2021)    Anion gap 7 5 - 15    Comment: Performed at [facility], [address] [place], [place] [address]  Aerobic/Anaerobic Culture w Gram Stain (surgical/deep wound)     Status: None (Preliminary result)   Collection Time: 06/06/20  5:22 PM   Specimen: Hand; Abscess  Result Value Ref Range   Specimen Description      HAND Performed at [facility], [address] [place], [place] [address]     Special Requests      NONE Performed at [facility], [address], [place], [place] [address]    Gram Stain PENDING    Culture      TOO YOUNG TO READ Performed at [facility], [place] [address] [place], [place] [address]    Report Status PENDING   Basic metabolic panel     Status: Abnormal   Collection Time: 06/07/20  4:40 AM  Result Value Ref Range   Sodium 139 135 -  145 mmol/L   Potassium 3.9 3.5 - 5.1 mmol/L   Chloride 103 98 - 111 mmol/L   CO2 28 22 - 32 mmol/L   Glucose, Bld 115 (H) 70 - 99 mg/dL    Comment: Glucose reference range applies only to samples taken after fasting for at least 8 hours.   BUN 24 (H) 8 - 23 mg/dL   Creatinine, Ser 0.92 0.61 - 1.24 mg/dL   Calcium 8.4 (L) 8.9 - 10.3 mg/dL   GFR, Estimated >60 >60 mL/min    Comment: (NOTE) Calculated using the CKD-EPI Creatinine Equation (2021)    Anion gap 8 5 - 15    Comment: Performed at [facility], [address] [place], [place] [address]   No components found for: ESR, C REACTIVE PROTEIN MICRO: Recent Results (from the past 720 hour(s))  Culture, blood (routine x 2)     Status: Abnormal   Collection Time: 06/04/20 11:40 AM   Specimen: BLOOD RIGHT ARM  Result Value Ref Range Status   Specimen Description   Final    BLOOD RIGHT ARM Performed at [facility], [address] [place], [place] [address]    Special Requests   Final    BAA Blood Culture adequate volume Performed at [facility], [address] [place], [place] [address]    Culture  Setup Time   Final    GRAM POSITIVE COCCI ANAEROBIC BOTTLE ONLY Organism ID to follow CRITICAL RESULT CALLED TO, READ BACK BY AND VERIFIED WITH: A.CHAPPEL,PHARMD AT 6967 ON 06/05/20 BY GM Performed at [facility], [address] [place], [place] [address]    Culture STAPHYLOCOCCUS AUREUS (A)  Final   Report Status 06/07/2020 FINAL  Final   Organism ID, Bacteria STAPHYLOCOCCUS AUREUS  Final       Susceptibility   Staphylococcus aureus - MIC*    CIPROFLOXACIN <=0.5 SENSITIVE Sensitive     ERYTHROMYCIN <=0.25 SENSITIVE Sensitive     GENTAMICIN <=0.5 SENSITIVE Sensitive     OXACILLIN 0.5 SENSITIVE Sensitive     TETRACYCLINE <=1 SENSITIVE Sensitive     VANCOMYCIN <=0.5 SENSITIVE Sensitive     TRIMETH/SULFA <=10 SENSITIVE Sensitive     CLINDAMYCIN <=0.25 SENSITIVE Sensitive     RIFAMPIN <=0.5 SENSITIVE Sensitive     Inducible Clindamycin NEGATIVE Sensitive     * STAPHYLOCOCCUS AUREUS  Blood Culture ID Panel (Reflexed)     Status: Abnormal   Collection Time: 06/04/20 11:40 AM  Result Value Ref Range Status   Enterococcus faecalis NOT DETECTED NOT DETECTED Final   Enterococcus Faecium NOT DETECTED NOT DETECTED Final   Listeria monocytogenes NOT DETECTED NOT DETECTED Final   Staphylococcus species DETECTED (A) NOT DETECTED Final    Comment: CRITICAL RESULT CALLED TO, READ BACK BY AND VERIFIED WITH: A.CHAPPEL,PHARMD AT 0917 ON 06/05/20 BY GM    Staphylococcus aureus (BCID) DETECTED (A) NOT DETECTED Final    Comment: CRITICAL RESULT CALLED TO, READ BACK BY AND VERIFIED WITH: A.CHAPPEL,PHARMD AT 0175 ON 06/05/20 BY GM    Staphylococcus epidermidis NOT DETECTED NOT DETECTED Final   Staphylococcus lugdunensis NOT DETECTED NOT DETECTED Final   Streptococcus species NOT DETECTED NOT DETECTED Final   Streptococcus agalactiae NOT DETECTED NOT DETECTED Final   Streptococcus pneumoniae NOT DETECTED NOT DETECTED Final   Streptococcus pyogenes NOT DETECTED NOT DETECTED Final   A.calcoaceticus-baumannii NOT DETECTED NOT DETECTED Final   Bacteroides fragilis NOT DETECTED NOT DETECTED Final   Enterobacterales NOT DETECTED NOT DETECTED Final  Enterobacter cloacae complex NOT DETECTED NOT DETECTED Final   Escherichia coli NOT DETECTED NOT DETECTED Final   Klebsiella aerogenes NOT DETECTED NOT DETECTED Final   Klebsiella oxytoca NOT DETECTED NOT DETECTED Final   Klebsiella pneumoniae NOT DETECTED  NOT DETECTED Final   Proteus species NOT DETECTED NOT DETECTED Final   Salmonella species NOT DETECTED NOT DETECTED Final   Serratia marcescens NOT DETECTED NOT DETECTED Final   Haemophilus influenzae NOT DETECTED NOT DETECTED Final   Neisseria meningitidis NOT DETECTED NOT DETECTED Final   Pseudomonas aeruginosa NOT DETECTED NOT DETECTED Final   Stenotrophomonas maltophilia NOT DETECTED NOT DETECTED Final   Candida albicans NOT DETECTED NOT DETECTED Final   Candida auris NOT DETECTED NOT DETECTED Final   Candida glabrata NOT DETECTED NOT DETECTED Final   Candida krusei NOT DETECTED NOT DETECTED Final   Candida parapsilosis NOT DETECTED NOT DETECTED Final   Candida tropicalis NOT DETECTED NOT DETECTED Final   Cryptococcus neoformans/gattii NOT DETECTED NOT DETECTED Final   Meth resistant mecA/C and MREJ NOT DETECTED NOT DETECTED Final    Comment: Performed at [facility], [place], [place] [address]  SARS CORONAVIRUS 2 (TAT 6-24 HRS) Nasopharyngeal Nasopharyngeal Swab     Status: Abnormal   Collection Time: 06/04/20 11:48 AM   Specimen: Nasopharyngeal Swab  Result Value Ref Range Status   SARS Coronavirus 2 POSITIVE (A) NEGATIVE Final    Comment: (NOTE) SARS-CoV-2 target nucleic acids are DETECTED.  The SARS-CoV-2 RNA is generally detectable in upper and lower respiratory specimens during the acute phase of infection. Positive results are indicative of the presence of SARS-CoV-2 RNA. Clinical correlation with patient history and other diagnostic information is  necessary to determine patient infection status. Positive results do not rule out bacterial infection or co-infection with other viruses.  The expected result is Negative.  Fact Sheet for Patients: SugarRoll.be  Fact Sheet for Healthcare Providers: https://www.woods-mathews.com/  This test is not yet approved or cleared by the [place] FDA and  has  been authorized for detection and/or diagnosis of SARS-CoV-2 by FDA under an Emergency Use Authorization (EUA). This EUA will remain  in effect (meaning this test can be used) for the duration of the COVID-19 declaration under Section 564(b)(1) of the Act, 21 U. S.C. section 360bbb-3(b)(1), unless the authorization is terminated or revoked sooner.   Performed at [facility], [place] [address], [place], [place] [address]   Culture, blood (Routine X 2) w Reflex to ID Panel     Status: None (Preliminary result)   Collection Time: 06/05/20 10:55 AM   Specimen: BLOOD LEFT HAND  Result Value Ref Range Status   Specimen Description BLOOD LEFT HAND  Final   Special Requests   Final    BOTTLES DRAWN AEROBIC AND ANAEROBIC Blood Culture adequate volume   Culture   Final    NO GROWTH 2 DAYS Performed at [facility], [address] [place], [place] [address]    Report Status PENDING  Incomplete  Culture, blood (Routine X 2) w Reflex to ID Panel     Status: None (Preliminary result)   Collection Time: 06/05/20 11:51 AM   Specimen: BLOOD  Result Value Ref Range Status   Specimen Description BLOOD BLOOD LEFT HAND  Final   Special Requests   Final    BOTTLES DRAWN AEROBIC AND ANAEROBIC Blood Culture adequate volume   Culture   Final    NO GROWTH 2 DAYS Performed at [facility]  [facility], [address], [place], [place] [address]    Report Status PENDING  Incomplete  Aerobic/Anaerobic Culture w Gram Stain (surgical/deep wound)     Status: None (Preliminary result)   Collection Time: 06/06/20  5:22 PM   Specimen: Hand; Abscess  Result Value Ref Range Status   Specimen Description   Final    HAND Performed at [facility], [address], [place], [place] [address]    Special Requests   Final    NONE Performed at [facility], [address], [place], [place] [address]    Gram Stain PENDING  Incomplete   Culture   Final    TOO YOUNG TO  READ Performed at [facility], [place] [address], [place], [place] [address]    Report Status PENDING  Incomplete    IMAGING: CT Head Wo Contrast  Result Date: 06/04/2020 CLINICAL DATA:  Unwitnessed fall. EXAM: CT HEAD WITHOUT CONTRAST CT CERVICAL SPINE WITHOUT CONTRAST TECHNIQUE: Multidetector CT imaging of the head and cervical spine was performed following the standard protocol without intravenous contrast. Multiplanar CT image reconstructions of the cervical spine were also generated. COMPARISON:  CT head April 27, 2020 FINDINGS: CT HEAD FINDINGS Brain: No evidence of acute large vascular territory infarction, hemorrhage, hydrocephalus, extra-axial collection or mass lesion/mass effect. Similar generalized cerebral atrophy with ex vacuo ventricular dilation. Similar moderate patchy and confluent white matter hypoattenuation, most likely related to chronic microvascular ischemic disease. Similar mineralization along the falx and tentorial leaflets. Vascular: No hyperdense vessel identified. Intracranial and extracranial calcific atherosclerosis. Skull: No acute fracture. Sinuses/Orbits: Mild ethmoid air cell mucosal thickening without visible air-fluid level. No acute abnormality in the visualized orbits. Other: Small left mastoid effusion. CT CERVICAL SPINE FINDINGS Alignment: Straightening of the normal cervical lordosis. Slight anterolisthesis of C2 on C3, favor degenerative given facet arthropathy at this level. Broad levocurvature. Skull base and vertebrae: Degenerative Schmorl's nodes involving the inferior T1 and T2 endplates. No evidence of acute fracture. Vertebral body heights are maintained. Diffuse osteopenia. Soft tissues and spinal canal: No prevertebral fluid or swelling. No visible canal hematoma. Disc levels: Severe degenerative disc disease at multiple levels with disc height loss, endplate sclerosis/cystic change, and posterior disc osteophyte complexes. Partial osseous fusion across  the C5-C6 disc and across the C4-C5 right facet joint. Bulky multilevel facet/uncovertebral hypertrophy with suspected severe foraminal stenosis on the right at C3-C4. Upper chest: No consolidation in the visualized lung apices. Other: Calcific atherosclerosis of the carotid arteries. IMPRESSION: CT head: 1. No evidence of acute intracranial abnormality. 2. Similar atrophy and chronic microvascular ischemic disease. CT cervical spine: 1. No evidence of acute fracture or traumatic malalignment the cervical spine. 2. Severe multilevel degenerative disc disease and bulky multilevel facet/uncovertebral hypertrophy with suspected severe foraminal stenosis on the right at C3-C4. MRI could better evaluate the canal/cord/foramina if clinically indicated. Electronically Signed   By: Margaretha Sheffield MD   On: 06/04/2020 10:29   CT Cervical Spine Wo Contrast  Result Date: 06/04/2020 CLINICAL DATA:  Unwitnessed fall. EXAM: CT HEAD WITHOUT CONTRAST CT CERVICAL SPINE WITHOUT CONTRAST TECHNIQUE: Multidetector CT imaging of the head and cervical spine was performed following the standard protocol without intravenous contrast. Multiplanar CT image reconstructions of the cervical spine were also generated. COMPARISON:  CT head April 27, 2020 FINDINGS: CT HEAD FINDINGS Brain: No evidence of acute large vascular territory infarction, hemorrhage, hydrocephalus, extra-axial collection or mass lesion/mass effect. Similar generalized cerebral atrophy with ex vacuo ventricular dilation.  Similar moderate patchy and confluent white matter hypoattenuation, most likely related to chronic microvascular ischemic disease. Similar mineralization along the falx and tentorial leaflets. Vascular: No hyperdense vessel identified. Intracranial and extracranial calcific atherosclerosis. Skull: No acute fracture. Sinuses/Orbits: Mild ethmoid air cell mucosal thickening without visible air-fluid level. No acute abnormality in the visualized orbits.  Other: Small left mastoid effusion. CT CERVICAL SPINE FINDINGS Alignment: Straightening of the normal cervical lordosis. Slight anterolisthesis of C2 on C3, favor degenerative given facet arthropathy at this level. Broad levocurvature. Skull base and vertebrae: Degenerative Schmorl's nodes involving the inferior T1 and T2 endplates. No evidence of acute fracture. Vertebral body heights are maintained. Diffuse osteopenia. Soft tissues and spinal canal: No prevertebral fluid or swelling. No visible canal hematoma. Disc levels: Severe degenerative disc disease at multiple levels with disc height loss, endplate sclerosis/cystic change, and posterior disc osteophyte complexes. Partial osseous fusion across the C5-C6 disc and across the C4-C5 right facet joint. Bulky multilevel facet/uncovertebral hypertrophy with suspected severe foraminal stenosis on the right at C3-C4. Upper chest: No consolidation in the visualized lung apices. Other: Calcific atherosclerosis of the carotid arteries. IMPRESSION: CT head: 1. No evidence of acute intracranial abnormality. 2. Similar atrophy and chronic microvascular ischemic disease. CT cervical spine: 1. No evidence of acute fracture or traumatic malalignment the cervical spine. 2. Severe multilevel degenerative disc disease and bulky multilevel facet/uncovertebral hypertrophy with suspected severe foraminal stenosis on the right at C3-C4. MRI could better evaluate the canal/cord/foramina if clinically indicated. Electronically Signed   By: Margaretha Sheffield MD   On: 06/04/2020 10:29   MR HAND RIGHT WO CONTRAST  Result Date: 06/06/2020 CLINICAL DATA:  Right hand pain and swelling.  Bacteremia. EXAM: MRI OF THE RIGHT HAND WITHOUT CONTRAST TECHNIQUE: Multiplanar, multisequence MR imaging of the right hand was performed. No intravenous contrast was administered. COMPARISON:  Right hand x-rays dated June 05, 2020. FINDINGS: Despite efforts by the technologist and patient, motion  artifact is present on today's exam and could not be eliminated. This reduces exam sensitivity and specificity. Bones/Joint/Cartilage No suspicious marrow signal abnormality. No fracture or dislocation. Mild osteoarthritis of the wrist, MCP joints, and IP joints. Large cyst in the lunate. Small midcarpal joint effusion. Ligaments Collateral ligaments are intact. Muscles and Tendons Flexor and extensor tendons are intact. There is prominent soft tissue edema around the extensor tendons but not within the tendon sheaths themselves. Soft tissue Diffuse soft tissue swelling, most severe over the dorsal hand. No fluid collection or hematoma. No soft tissue mass. IMPRESSION: 1. Diffuse soft tissue swelling, most severe over the dorsal hand, consistent with reported history of cellulitis. No abscess. 2. No evidence of osteomyelitis. 3. No tenosynovitis. Prominent soft tissue edema around the extensor tendons but not within the tendon sheaths themselves. Electronically Signed   By: Titus Dubin M.D.   On: 06/06/2020 09:31   DG Hand 2 View Right  Result Date: 06/05/2020 CLINICAL DATA:  Right upper arm cellulitis with right hand pain and swelling. EXAM: RIGHT HAND - 2 VIEW COMPARISON:  None. FINDINGS: Diffuse dorsal soft tissue swelling, most pronounced at the level of the distal metacarpals. No fractures, bone destruction or periosteal reaction. No soft tissue gas. Mild-to-moderate 3rd MCP joint degenerative changes. Extensive arterial calcifications. IMPRESSION: 1. Diffuse dorsal soft tissue swelling without underlying bony abnormality. 2. Extensive atheromatous arterial calcifications. Electronically Signed   By: Claudie Revering M.D.   On: 06/05/2020 15:39   US Venous Img Upper Uni Right(DVT)  Result Date: 06/04/2020  CLINICAL DATA:  Right upper extremity cellulitis EXAM: RIGHT UPPER EXTREMITY VENOUS DOPPLER ULTRASOUND TECHNIQUE: Gray-scale sonography with graded compression, as well as color Doppler and duplex  ultrasound were performed to evaluate the upper extremity deep venous system from the level of the subclavian vein and including the jugular, axillary, basilic, radial, ulnar and upper cephalic vein. Spectral Doppler was utilized to evaluate flow at rest and with distal augmentation maneuvers. COMPARISON:  None. FINDINGS: Contralateral Subclavian Vein: Respiratory phasicity is normal and symmetric with the symptomatic side. No evidence of thrombus. Normal compressibility. Internal Jugular Vein: No evidence of thrombus. Normal compressibility, respiratory phasicity and response to augmentation. Subclavian Vein: No evidence of thrombus. Normal compressibility, respiratory phasicity and response to augmentation. Axillary Vein: No evidence of thrombus. Normal compressibility, respiratory phasicity and response to augmentation. Cephalic Vein: No evidence of thrombus. Normal compressibility, respiratory phasicity and response to augmentation. Basilic Vein: No evidence of thrombus. Normal compressibility, respiratory phasicity and response to augmentation. Brachial Veins: No evidence of thrombus. Normal compressibility, respiratory phasicity and response to augmentation. Radial Veins: No evidence of thrombus. Normal compressibility, respiratory phasicity and response to augmentation. Ulnar Veins: No evidence of thrombus. Normal compressibility, respiratory phasicity and response to augmentation. Venous Reflux:  None visualized. Other Findings:  Soft tissue edema about the forearm. IMPRESSION: 1.  No evidence of DVT within the right upper extremity. 2.  Soft tissue edema about the forearm. Electronically Signed   By: Eddie Candle M.D.   On: 06/04/2020 14:22   DG Chest Port 1 View  Result Date: 06/04/2020 CLINICAL DATA:  Unwitnessed fall EXAM: PORTABLE CHEST 1 VIEW COMPARISON:  Prior chest x-ray 04/27/2020 FINDINGS: Stable cardiac and mediastinal contours. Atherosclerotic calcification present in the transverse aorta.  Stable appearance of the lungs with chronic bronchitic changes and mild interstitial prominence. No focal consolidation, pleural effusion or pneumothorax. No acute fracture identified. IMPRESSION: No active disease. Electronically Signed   By: Jacqulynn Cadet M.D.   On: 06/04/2020 11:20   DG Knee Complete 4 Views Right  Result Date: 06/04/2020 CLINICAL DATA:  Pain following fall EXAM: RIGHT KNEE - COMPLETE 4+ VIEW COMPARISON:  None. FINDINGS: Frontal, lateral, and bilateral oblique views were obtained. There is no fracture or dislocation. No evident joint effusion. There is moderate joint space narrowing medially. There is slight joint space narrowing elsewhere. There is chondrocalcinosis. There are foci of arterial vascular calcification. IMPRESSION: No fracture or dislocation. No joint effusion. Osteoarthritic change, most notably medially. Chondrocalcinosis present, a finding that may be seen with osteoarthritis or calcium pyrophosphate deposition disease. Foci of atherosclerotic arterial vascular calcification noted. Electronically Signed   By: Lowella Grip III M.D.   On: 06/04/2020 10:34   DG Hip Unilat With Pelvis 2-3 Views Right  Result Date: 06/04/2020 CLINICAL DATA:  Pain following fall EXAM: DG HIP (WITH OR WITHOUT PELVIS) 2-3V RIGHT COMPARISON:  Pelvis CT April 19, 2020 FINDINGS: Frontal pelvis as well as frontal and lateral right hip images obtained. 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 Bones are diffusely osteoporotic. There is no acute fracture or dislocation. There is mild narrowing of the left hip joint. There is postoperative change in the lower lumbar and upper sacral regions. Multifocal arterial vascular calcification noted. IMPRESSION: Total hip replacement on the right with prosthetic components well-seated. No fracture or dislocation. Underlying osteoporosis. Postoperative change lower lumbar spine and upper sacrum.  Slight narrowing left hip joint. Atherosclerotic arterial vascular calcification present. Electronically Signed  By: Lowella Grip III M.D.   On: 06/04/2020 10:33    Assessment:   BREVEN GUIDROZ is a 87 y.o. male admitted after a fall with R UE cellulitis and MSSA bacteremia.  He has a hx of Myasathenia gravis and is on pred 20 mg daily.  He has a THR on R but now pain at site.  FU BCX ngtd TTE pending  Recommendations Cont cefazolin Surgery is considering I and D Will likely need 4 weeks IV abx.  Can place picc now that bcx neg x 48 hours.  PICC Ordered.  Thank you very much for allowing me to participate in the care of this patient. Please call with questions.   Cheral Marker. Ola Spurr, MD

## 2020-06-07 NOTE — Plan of Care (Signed)
  Problem: Clinical Measurements: Goal: Cardiovascular complication will be avoided Outcome: Progressing   Problem: Nutrition: Goal: Adequate nutrition will be maintained Outcome: Progressing   Problem: Pain Managment: Goal: General experience of comfort will improve Outcome: Progressing   Problem: Safety: Goal: Ability to remain free from injury will improve Outcome: Progressing   Problem: Skin Integrity: Goal: Risk for impaired skin integrity will decrease Outcome: Progressing   

## 2020-06-08 ENCOUNTER — Encounter: Payer: Self-pay | Admitting: Orthopedic Surgery

## 2020-06-08 ENCOUNTER — Inpatient Hospital Stay: Payer: Medicare Other

## 2020-06-08 DIAGNOSIS — R6520 Severe sepsis without septic shock: Secondary | ICD-10-CM

## 2020-06-08 DIAGNOSIS — A419 Sepsis, unspecified organism: Secondary | ICD-10-CM

## 2020-06-08 DIAGNOSIS — L03113 Cellulitis of right upper limb: Secondary | ICD-10-CM | POA: Diagnosis not present

## 2020-06-08 DIAGNOSIS — A4101 Sepsis due to Methicillin susceptible Staphylococcus aureus: Secondary | ICD-10-CM | POA: Diagnosis not present

## 2020-06-08 DIAGNOSIS — U071 COVID-19: Secondary | ICD-10-CM | POA: Diagnosis not present

## 2020-06-08 MED ORDER — CHLORHEXIDINE GLUCONATE CLOTH 2 % EX PADS
6.0000 | MEDICATED_PAD | Freq: Every day | CUTANEOUS | Status: DC
  Administered 2020-06-08 – 2020-06-13 (×6): 6 via TOPICAL

## 2020-06-08 MED ORDER — IPRATROPIUM-ALBUTEROL 0.5-2.5 (3) MG/3ML IN SOLN
3.0000 mL | RESPIRATORY_TRACT | Status: DC | PRN
  Administered 2020-06-08: 3 mL via RESPIRATORY_TRACT
  Filled 2020-06-08: qty 3

## 2020-06-08 MED ORDER — IPRATROPIUM-ALBUTEROL 20-100 MCG/ACT IN AERS
1.0000 | INHALATION_SPRAY | RESPIRATORY_TRACT | Status: DC | PRN
  Administered 2020-06-11 – 2020-06-14 (×3): 1 via RESPIRATORY_TRACT
  Filled 2020-06-08: qty 4

## 2020-06-08 MED ORDER — SODIUM CHLORIDE 0.9% FLUSH
10.0000 mL | Freq: Two times a day (BID) | INTRAVENOUS | Status: DC
  Administered 2020-06-08 – 2020-06-13 (×9): 10 mL

## 2020-06-08 MED ORDER — SODIUM CHLORIDE 0.9% FLUSH: 10.0000 mL | INTRAVENOUS | Status: DC | PRN

## 2020-06-08 NOTE — Progress Notes (Signed)
Subjective:  Patient reports pain as mild.    Objective:   VITALS:   Vitals:   06/07/20 1800 06/07/20 2022 06/08/20 0524 06/08/20 0813  BP: (!) 191/110 (!) 132/119 134/60 (!) 156/76  Pulse:  85 62 60  Resp:  18  18  Temp:  97.7 F (36.5 C) 97.7 F (36.5 C) 97.8 F (36.6 C)  TempSrc:   Oral Oral  SpO2:  98% 98% 100%  Weight:      Height:        PHYSICAL EXAM:  Neurovascular intact Incision: dressing C/D/I Compartment soft  LABS  Results for orders placed or performed during the hospital encounter of 06/04/20 (from the past 24 hour(s))  Aerobic/Anaerobic Culture w Gram Stain (surgical/deep wound)     Status: None (Preliminary result)   Collection Time: 06/07/20  5:04 PM   Specimen: PATH Other; Wound  Result Value Ref Range   Specimen Description      FLUID RIGHT WRIST Performed at [facility], [address], [place], [place] [address]    Special Requests      NONE Performed at [facility], [address] [place], [place] [address]    Gram Stain      NO WBC SEEN RARE GRAM POSITIVE COCCI Performed at [facility], [address], [place], [place] [address]    Culture PENDING    Report Status PENDING   Aerobic/Anaerobic Culture w Gram Stain (surgical/deep wound)     Status: None (Preliminary result)   Collection Time: 06/07/20  5:04 PM   Specimen: PATH Other; Wound  Result Value Ref Range   Specimen Description WOUND RIGHT HAND    Special Requests NONE    Gram Stain      RARE WBC PRESENT,BOTH PMN AND MONONUCLEAR NO ORGANISMS SEEN Performed at [facility], [address], [place], [place] [address]    Culture PENDING    Report Status PENDING     ECHOCARDIOGRAM COMPLETE  Result Date: 06/07/2020    ECHOCARDIOGRAM REPORT   Patient Name:   Gregory Small [facility] Date of Exam: 06/07/2020 Medical Rec #:  [id number]           Height:       67.0 in Accession #:    [id number]          Weight:       187.1 lb Date of Birth:  Nov 03, 1919             BSA:          1.966 m Patient Age:    87 years            BP:           157/91 mmHg Patient Gender: M                   HR:           56 bpm. Exam Location:  [facility] Procedure: 2D Echo, Color Doppler and Cardiac Doppler Indications:     R78.81 Bacteremia  History:         Patient has no prior history of Echocardiogram examinations.                  Risk Factors:Hypertension. Pt tested positive for COVID-19 on                  06/04/2020.  Sonographer:     Humphrey Rolls RDCS (AE) Referring Phys:  [phone]  Freda Munro ZHANG Diagnosing Phys: Yvonne Kendall MD  Sonographer Comments: Suboptimal subcostal window. IMPRESSIONS  1. Left ventricular ejection fraction, by estimation, is 55 to 60%. The left ventricle has normal function. The left ventricle has no regional wall motion abnormalities. Left ventricular diastolic parameters are indeterminate.  2. Right ventricular systolic function is normal. The right ventricular size is normal. There is moderately elevated pulmonary artery systolic pressure.  3. Right atrial size was mildly dilated.  4. The mitral valve is degenerative. Mild mitral valve regurgitation. No evidence of mitral stenosis. Moderate mitral annular calcification.  5. The aortic valve has an indeterminant number of cusps. There is mild calcification of the aortic valve. There is moderate thickening of the aortic valve. Aortic valve regurgitation is not visualized. Mild to moderate aortic valve sclerosis/calcification is present, without any evidence of aortic stenosis.  6. The inferior vena cava is dilated in size with <50% respiratory variability, suggesting right atrial pressure of 15 mmHg. FINDINGS  Left Ventricle: Left ventricular ejection fraction, by estimation, is 55 to 60%. The left ventricle has normal function. The left ventricle has no regional wall motion abnormalities. The left ventricular internal cavity size was normal in size. There is  borderline left ventricular hypertrophy. Left ventricular  diastolic parameters are indeterminate. Right Ventricle: The right ventricular size is normal. No increase in right ventricular wall thickness. Right ventricular systolic function is normal. There is moderately elevated pulmonary artery systolic pressure. The tricuspid regurgitant velocity is 2.78 m/s, and with an assumed right atrial pressure of 15 mmHg, the estimated right ventricular systolic pressure is 45.9 mmHg. Left Atrium: Left atrial size was normal in size. Right Atrium: Right atrial size was mildly dilated. Pericardium: There is no evidence of pericardial effusion. Mitral Valve: The mitral valve is degenerative in appearance. There is moderate thickening of the mitral valve leaflet(s). There is mild calcification of the mitral valve leaflet(s). Moderate mitral annular calcification. Mild mitral valve regurgitation.  No evidence of mitral valve stenosis. MV peak gradient, 8.9 mmHg. The mean mitral valve gradient is 3.0 mmHg. Tricuspid Valve: The tricuspid valve is not well visualized. Tricuspid valve regurgitation is trivial. Aortic Valve: The aortic valve has an indeterminant number of cusps. There is mild calcification of the aortic valve. There is moderate thickening of the aortic valve. Aortic valve regurgitation is not visualized. Mild to moderate aortic valve sclerosis/calcification is present, without any evidence of aortic stenosis. Aortic valve mean gradient measures 5.0 mmHg. Aortic valve peak gradient measures 9.1 mmHg. Aortic valve area, by VTI measures 1.14 cm. Pulmonic Valve: The pulmonic valve was grossly normal. Pulmonic valve regurgitation is not visualized. No evidence of pulmonic stenosis. Aorta: The aortic root is normal in size and structure. Venous: The inferior vena cava is dilated in size with less than 50% respiratory variability, suggesting right atrial pressure of 15 mmHg. IAS/Shunts: The interatrial septum was not well visualized.  LEFT VENTRICLE PLAX 2D LVIDd:         3.70 cm   Diastology LVIDs:         2.30 cm  LV e' medial:    7.18 cm/s LV PW:         1.10 cm  LV E/e' medial:  17.9 LV IVS:        0.90 cm  LV e' lateral:   6.85 cm/s LVOT diam:     2.10 cm  LV E/e' lateral: 18.8 LV SV:         43 LV SV Index:  22 LVOT Area:     3.46 cm  RIGHT VENTRICLE RV Basal diam:  2.70 cm LEFT ATRIUM           Index       RIGHT ATRIUM           Index LA diam:      3.70 cm 1.88 cm/m  RA Area:     19.80 cm LA Vol (A2C): 64.0 ml 32.56 ml/m RA Volume:   51.50 ml  26.20 ml/m LA Vol (A4C): 32.9 ml 16.74 ml/m  AORTIC VALVE                    PULMONIC VALVE AV Area (Vmax):    1.51 cm     PV Vmax:       0.88 m/s AV Area (Vmean):   1.32 cm     PV Vmean:      61.200 cm/s AV Area (VTI):     1.14 cm     PV VTI:        0.186 m AV Vmax:           151.00 cm/s  PV Peak grad:  3.1 mmHg AV Vmean:          110.000 cm/s PV Mean grad:  2.0 mmHg AV VTI:            0.380 m AV Peak Grad:      9.1 mmHg AV Mean Grad:      5.0 mmHg LVOT Vmax:         66.00 cm/s LVOT Vmean:        41.900 cm/s LVOT VTI:          0.125 m LVOT/AV VTI ratio: 0.33  AORTA Ao Root diam: 3.40 cm MITRAL VALVE                TRICUSPID VALVE MV Area (PHT): 3.10 cm     TR Peak grad:   30.9 mmHg MV Area VTI:   1.23 cm     TR Vmax:        278.00 cm/s MV Peak grad:  8.9 mmHg MV Mean grad:  3.0 mmHg     SHUNTS MV Vmax:       1.49 m/s     Systemic VTI:  0.12 m MV Vmean:      72.6 cm/s    Systemic Diam: 2.10 cm MV Decel Time: 245 msec MV E velocity: 128.50 cm/s Yvonne Kendall MD Electronically signed by Yvonne Kendall MD Signature Date/Time: 06/07/2020/4:00:35 PM    Final    [place] EKG SITE RITE  Result Date: 06/07/2020 If Site Rite image not attached, placement could not be confirmed due to current cardiac rhythm.   Assessment/Plan: 1 Day Post-Op   Principal Problem:   Cellulitis of right upper arm Active Problems:   Myasthenia gravis (HCC)   Atrial fibrillation, chronic (HCC)   Hypothyroidism   Chronic pain syndrome   Fall   CKD  (chronic kidney disease), stage IIIa   Sepsis (HCC)   HTN (hypertension)   GERD (gastroesophageal reflux disease)   COVID-19 virus infection   MSSA (methicillin susceptible Staphylococcus aureus) septicemia (HCC)   POD#1 right wrist and hand irrigation and debridement Follow cultures Will change dressing in the office next week if discharged, appointment scheduled Continue to elevate hand Continue antibiotics per ID   Lyndle Herrlich , MD 06/08/2020, 12:49 PM

## 2020-06-08 NOTE — Progress Notes (Signed)
Unable to reach patient's RN at this time.  Notified charge RN Alcario Drought of policy of not using nebulizer in Covid positive patient room due to aerosolized particles.  Contacted pharmacy to have nebulizer switched to combivent inhaler.

## 2020-06-08 NOTE — Progress Notes (Addendum)
PROGRESS NOTE    Gregory Small  QPY:[id number] DOB: [birth year] DOA: 06/04/2020 PCP: [facility]    CC; right hand swelling Brief Narrative:  Gregory Small a 87 y.o.malewith medical history significant ofhypertension, GERD, hypothyroidism, vertigo, atrial fibrillation on anticoagulants, chronic pain syndrome, CKD stage IIIa,myasthenia gravis after receiving 2 doses of COVID vaccine,who presents with a fall, pain in right hip and right knee, right arm pain and redness. Upon arriving the hospital, he was Covid positive. Right hand was swelling and tender. He was diagnosed with cellulitis of right hand, start vancomycin and Rocephin. Blood culture came back with MSSA, antibiotic switched to cefazolin. Surgical I &D performed 4/11.   Assessment & Plan:   Principal Problem:   Cellulitis of right upper arm Active Problems:   Myasthenia gravis (HCC)   Atrial fibrillation, chronic (HCC)   Hypothyroidism   Chronic pain syndrome   Fall   CKD (chronic kidney disease), stage IIIa   Sepsis (HCC)   HTN (hypertension)   GERD (gastroesophageal reflux disease)   COVID-19 virus infection   MSSA (methicillin susceptible Staphylococcus aureus) septicemia (HCC)  #1. Sepsis with MSSA septicemia secondary to right arm cellulitis Right arm cellulitis with small right hand abscess due to MSSA..  Status post I&D 4/11. Acute kidney injury. Patient blood culture was positive for MRSA, repeat culture was negative. Echocardiogram did not show any valvular changes.  Patient is currently being treated with cefazolin.  He received 2 doses of linezolid since last night as he lost IV.  PICC line will be placed today. Patient is scheduled to be transferred nursing home on Thursday or Friday for 4 weeks of IV antibiotics. Renal function had improved.  2.  Myasthenia gravis pain Stable  3.  Covid infection. Still asymptomatic.  4.  Chronic atrial fibrillation.   Not  chronically on anticoagulation, no tachycardia.      DVT prophylaxis: Lovenox Code Status: DNR Family Communication:  Disposition Plan:  .   Status is: Inpatient  Remains inpatient appropriate because:Inpatient level of care appropriate due to severity of illness   Dispo: The patient is from: ALF              Anticipated d/c is to: SNF              Patient currently is not medically stable to d/c.   Difficult to place patient No        I/O last 3 completed shifts: In: 720 [P.O.:120; IV Piggyback:600] Out: 327 [Urine:325; Blood:2] Total I/O In: 360 [P.O.:360] Out: -      Consultants:   ID, orthopedics  Procedures: I &D  Antimicrobials: Cefazolin.  Subjective: Patient feels better today, no significant effusion. Still has pain in the right hand. No fever chills. No short of breath or cough. No chest pain or palpitation. No abdominal pain or nausea vomiting. Objective: Vitals:   06/07/20 1800 06/07/20 2022 06/08/20 0524 06/08/20 0813  BP: (!) 191/110 (!) 132/119 134/60 (!) 156/76  Pulse:  85 62 60  Resp:  18  18  Temp:  97.7 F (36.5 C) 97.7 F (36.5 C) 97.8 F (36.6 C)  TempSrc:   Oral Oral  SpO2:  98% 98% 100%  Weight:      Height:        Intake/Output Summary (Last 24 hours) at 06/08/2020 1425 Last data filed at 06/08/2020 0800 Gross per 24 hour  Intake 1080 ml  Output 202 ml  Net 878 ml   Ceasar Mons  Weights   06/04/20 0924  Weight: 84.9 kg    Examination:  General exam: Appears calm and comfortable  Respiratory system: Clear to auscultation. Respiratory effort normal. Cardiovascular system: S1 & S2 heard, RRR. No JVD, murmurs, rubs, gallops or clicks. No pedal edema. Gastrointestinal system: Abdomen is nondistended, soft and nontender. No organomegaly or masses felt. Normal bowel sounds heard. Central nervous system: Alert and oriented x3. No focal neurological deficits. Extremities: Right hand swelling. Skin: No rashes, lesions or  ulcers Psychiatry: Judgement and insight appear normal. Mood & affect appropriate.     Data Reviewed: I have personally reviewed following labs and imaging studies  CBC: Recent Labs  Lab 06/04/20 0939 06/05/20 0524  WBC 15.8* 13.8*  NEUTROABS 12.9*  --   HGB 12.4* 11.2*  HCT 39.5 35.3*  MCV 97.1 97.2  PLT 186 169   Basic Metabolic Panel: Recent Labs  Lab 06/04/20 0939 06/05/20 0524 06/06/20 0455 06/07/20 0440  NA 137 138 139 139  K 4.9 3.6 3.9 3.9  CL 100 104 105 103  CO2 28 26 27 28   GLUCOSE 118* 92 120* 115*  BUN 23 23 25* 24*  CREATININE 1.48* 1.03 0.89 0.92  CALCIUM 8.9 8.5* 8.4* 8.4*   GFR: Estimated Creatinine Clearance: 46.7 mL/min (by C-G formula based on SCr of 0.92 mg/dL). Liver Function Tests: Recent Labs  Lab 06/04/20 0939  AST 48*  ALT 21  ALKPHOS 55  BILITOT 1.9*  PROT 6.8  ALBUMIN 3.4*   No results for input(s): LIPASE, AMYLASE in the last 168 hours. No results for input(s): AMMONIA in the last 168 hours. Coagulation Profile: No results for input(s): INR, PROTIME in the last 168 hours. Cardiac Enzymes: No results for input(s): CKTOTAL, CKMB, CKMBINDEX, TROPONINI in the last 168 hours. BNP (last 3 results) No results for input(s): PROBNP in the last 8760 hours. HbA1C: No results for input(s): HGBA1C in the last 72 hours. CBG: No results for input(s): GLUCAP in the last 168 hours. Lipid Profile: No results for input(s): CHOL, HDL, LDLCALC, TRIG, CHOLHDL, LDLDIRECT in the last 72 hours. Thyroid Function Tests: No results for input(s): TSH, T4TOTAL, FREET4, T3FREE, THYROIDAB in the last 72 hours. Anemia Panel: No results for input(s): VITAMINB12, FOLATE, FERRITIN, TIBC, IRON, RETICCTPCT in the last 72 hours. Sepsis Labs: Recent Labs  Lab 06/04/20 1129 06/04/20 1140  PROCALCITON  --  1.12  LATICACIDVEN 1.5  --     Recent Results (from the past 240 hour(s))  Culture, blood (routine x 2)     Status: Abnormal   Collection Time:  06/04/20 11:40 AM   Specimen: BLOOD RIGHT ARM  Result Value Ref Range Status   Specimen Description   Final    BLOOD RIGHT ARM Performed at [facility], [address], [place], [place]    Special Requests   Final    BAA Blood Culture adequate volume Performed at [facility], [address] [place], [place]    Culture  Setup Time   Final    GRAM POSITIVE COCCI ANAEROBIC BOTTLE ONLY Organism ID to follow CRITICAL RESULT CALLED TO, READ BACK BY AND VERIFIED WITH: [place]ARMD [place] [address] ON 06/05/20 BY GM Performed at [facility], [address] [place], [place]    Culture STAPHYLOCOCCUS AUREUS (A)  Final   Report Status 06/07/2020 FINAL  Final   Organism ID, Bacteria STAPHYLOCOCCUS AUREUS  Final      Susceptibility   Staphylococcus aureus - MIC*  CIPROFLOXACIN <=0.5 SENSITIVE Sensitive     ERYTHROMYCIN <=0.25 SENSITIVE Sensitive     GENTAMICIN <=0.5 SENSITIVE Sensitive     OXACILLIN 0.5 SENSITIVE Sensitive     TETRACYCLINE <=1 SENSITIVE Sensitive     VANCOMYCIN <=0.5 SENSITIVE Sensitive     TRIMETH/SULFA <=10 SENSITIVE Sensitive     CLINDAMYCIN <=0.25 SENSITIVE Sensitive     RIFAMPIN <=0.5 SENSITIVE Sensitive     Inducible Clindamycin NEGATIVE Sensitive     * STAPHYLOCOCCUS AUREUS  Blood Culture ID Panel (Reflexed)     Status: Abnormal   Collection Time: 06/04/20 11:40 AM  Result Value Ref Range Status   Enterococcus faecalis NOT DETECTED NOT DETECTED Final   Enterococcus Faecium NOT DETECTED NOT DETECTED Final   Listeria monocytogenes NOT DETECTED NOT DETECTED Final   Staphylococcus species DETECTED (A) NOT DETECTED Final    Comment: CRITICAL RESULT CALLED TO, READ BACK BY AND VERIFIED WITH: A.CHAPPEL,PHARMD AT 0917 ON 06/05/20 BY GM    Staphylococcus aureus (BCID) DETECTED (A) NOT DETECTED Final    Comment: CRITICAL RESULT CALLED TO, READ BACK BY AND VERIFIED WITH: A.CHAPPEL,PHARMD AT 0917 ON 06/05/20 BY GM     Staphylococcus epidermidis NOT DETECTED NOT DETECTED Final   Staphylococcus lugdunensis NOT DETECTED NOT DETECTED Final   Streptococcus species NOT DETECTED NOT DETECTED Final   Streptococcus agalactiae NOT DETECTED NOT DETECTED Final   Streptococcus pneumoniae NOT DETECTED NOT DETECTED Final   Streptococcus pyogenes NOT DETECTED NOT DETECTED Final   A.calcoaceticus-baumannii NOT DETECTED NOT DETECTED Final   Bacteroides fragilis NOT DETECTED NOT DETECTED Final   Enterobacterales NOT DETECTED NOT DETECTED Final   Enterobacter cloacae complex NOT DETECTED NOT DETECTED Final   Escherichia coli NOT DETECTED NOT DETECTED Final   Klebsiella aerogenes NOT DETECTED NOT DETECTED Final   Klebsiella oxytoca NOT DETECTED NOT DETECTED Final   Klebsiella pneumoniae NOT DETECTED NOT DETECTED Final   Proteus species NOT DETECTED NOT DETECTED Final   Salmonella species NOT DETECTED NOT DETECTED Final   Serratia marcescens NOT DETECTED NOT DETECTED Final   Haemophilus influenzae NOT DETECTED NOT DETECTED Final   Neisseria meningitidis NOT DETECTED NOT DETECTED Final   Pseudomonas aeruginosa NOT DETECTED NOT DETECTED Final   Stenotrophomonas maltophilia NOT DETECTED NOT DETECTED Final   Candida albicans NOT DETECTED NOT DETECTED Final   Candida auris NOT DETECTED NOT DETECTED Final   Candida glabrata NOT DETECTED NOT DETECTED Final   Candida krusei NOT DETECTED NOT DETECTED Final   Candida parapsilosis NOT DETECTED NOT DETECTED Final   Candida tropicalis NOT DETECTED NOT DETECTED Final   Cryptococcus neoformans/gattii NOT DETECTED NOT DETECTED Final   Meth resistant mecA/C and MREJ NOT DETECTED NOT DETECTED Final    Comment: Performed at [facility], [address] [place], [place] [address]  SARS CORONAVIRUS 2 (TAT 6-24 HRS) Nasopharyngeal Nasopharyngeal Swab     Status: Abnormal   Collection Time: 06/04/20 11:48 AM   Specimen: Nasopharyngeal Swab  Result Value Ref Range Status   SARS  Coronavirus 2 POSITIVE (A) NEGATIVE Final    Comment: (NOTE) SARS-CoV-2 target nucleic acids are DETECTED.  The SARS-CoV-2 RNA is generally detectable in upper and lower respiratory specimens during the acute phase of infection. Positive results are indicative of the presence of SARS-CoV-2 RNA. Clinical correlation with patient history and other diagnostic information is  necessary to determine patient infection status. Positive results do not rule out bacterial infection or co-infection with other viruses.  The expected result is Negative.  Fact  Sheet for Patients: HairSlick.nohttps://www.fda.gov/media/138098/download  Fact Sheet for Healthcare Providers: quierodirigir.comhttps://www.fda.gov/media/138095/download  This test is not yet approved or cleared by the [place] FDA and  has been authorized for detection and/or diagnosis of SARS-CoV-2 by FDA under an Emergency Use Authorization (EUA). This EUA will remain  in effect (meaning this test can be used) for the duration of the COVID-19 declaration under Section 564(b)(1) of the Act, 21 U. S.C. section 360bbb-3(b)(1), unless the authorization is terminated or revoked sooner.   Performed at [facility], [address] [place], [place] [address]   Culture, blood (Routine X 2) w Reflex to ID Panel     Status: None (Preliminary result)   Collection Time: 06/05/20 10:55 AM   Specimen: BLOOD LEFT HAND  Result Value Ref Range Status   Specimen Description BLOOD LEFT HAND  Final   Special Requests   Final    BOTTLES DRAWN AEROBIC AND ANAEROBIC Blood Culture adequate volume   Culture   Final    NO GROWTH 3 DAYS Performed at [facility], [address] [place], [place] [address]    Report Status PENDING  Incomplete  Culture, blood (Routine X 2) w Reflex to ID Panel     Status: None (Preliminary result)   Collection Time: 06/05/20 11:51 AM   Specimen: BLOOD  Result Value Ref Range Status   Specimen Description BLOOD BLOOD LEFT HAND   Final   Special Requests   Final    BOTTLES DRAWN AEROBIC AND ANAEROBIC Blood Culture adequate volume   Culture   Final    NO GROWTH 3 DAYS Performed at [facility], [address] [place], [place] [address]    Report Status PENDING  Incomplete  Aerobic/Anaerobic Culture w Gram Stain (surgical/deep wound)     Status: None (Preliminary result)   Collection Time: 06/06/20  5:22 PM   Specimen: Hand; Abscess  Result Value Ref Range Status   Specimen Description   Final    HAND Performed at [facility], [address] [place], [place] [address]    Special Requests   Final    NONE Performed at [facility], [address] [place], [place] [address]    Gram Stain   Final    FEW WBC PRESENT, PREDOMINANTLY PMN NO ORGANISMS SEEN Performed at [facility], [address] [place], [place] [address]    Culture   Final    MODERATE STAPHYLOCOCCUS AUREUS SUSCEPTIBILITIES TO FOLLOW NO ANAEROBES ISOLATED; CULTURE IN PROGRESS FOR 5 DAYS    Report Status PENDING  Incomplete  Aerobic/Anaerobic Culture w Gram Stain (surgical/deep wound)     Status: None (Preliminary result)   Collection Time: 06/07/20  5:04 PM   Specimen: PATH Other; Wound  Result Value Ref Range Status   Specimen Description   Final    FLUID RIGHT WRIST Performed at [facility], [address] [place], [place] [address]    Special Requests   Final    NONE Performed at [facility], [address] [place], [place] [address]    Gram Stain   Final    NO WBC SEEN RARE GRAM POSITIVE COCCI Performed at [facility], [address] [place], [place] [address]    Culture PENDING  Incomplete   Report Status PENDING  Incomplete  Aerobic/Anaerobic Culture w Gram Stain (surgical/deep wound)     Status: None (Preliminary result)   Collection Time: 06/07/20  5:04 PM  Specimen: PATH Other; Wound  Result Value Ref Range Status   Specimen Description WOUND RIGHT HAND   Final   Special Requests NONE  Final   Gram Stain   Final    RARE WBC PRESENT,BOTH PMN AND MONONUCLEAR NO ORGANISMS SEEN Performed at [facility], [address] [place], [place] [address]    Culture PENDING  Incomplete   Report Status PENDING  Incomplete         Radiology Studies: ECHOCARDIOGRAM COMPLETE  Result Date: 06/07/2020    ECHOCARDIOGRAM REPORT   Patient Name:   Gregory Small [facility] Date of Exam: 06/07/2020 Medical Rec #:  [id number]           Height:       67.0 in Accession #:    [id number]          Weight:       187.1 lb Date of Birth:  [birth year]            BSA:          1.966 m Patient Age:    87 years            BP:           157/91 mmHg Patient Gender: M                   HR:           56 bpm. Exam Location:  [facility] Procedure: 2D Echo, Color Doppler and Cardiac Doppler Indications:     R78.81 Bacteremia  History:         Patient has no prior history of Echocardiogram examinations.                  Risk Factors:Hypertension. Pt tested positive for COVID-19 on                  06/04/2020.  Sonographer:     Humphrey Rolls RDCS (AE) Referring Phys:  [id number] Marrion Coy Diagnosing Phys: Yvonne Kendall MD  Sonographer Comments: Suboptimal subcostal window. IMPRESSIONS  1. Left ventricular ejection fraction, by estimation, is 55 to 60%. The left ventricle has normal function. The left ventricle has no regional wall motion abnormalities. Left ventricular diastolic parameters are indeterminate.  2. Right ventricular systolic function is normal. The right ventricular size is normal. There is moderately elevated pulmonary artery systolic pressure.  3. Right atrial size was mildly dilated.  4. The mitral valve is degenerative. Mild mitral valve regurgitation. No evidence of mitral stenosis. Moderate mitral annular calcification.  5. The aortic valve has an indeterminant number of cusps. There is mild calcification of the aortic valve. There is moderate thickening of the aortic valve. Aortic valve  regurgitation is not visualized. Mild to moderate aortic valve sclerosis/calcification is present, without any evidence of aortic stenosis.  6. The inferior vena cava is dilated in size with <50% respiratory variability, suggesting right atrial pressure of 15 mmHg. FINDINGS  Left Ventricle: Left ventricular ejection fraction, by estimation, is 55 to 60%. The left ventricle has normal function. The left ventricle has no regional wall motion abnormalities. The left ventricular internal cavity size was normal in size. There is  borderline left ventricular hypertrophy. Left ventricular diastolic parameters are indeterminate. Right Ventricle: The right ventricular size is normal. No increase in right ventricular wall thickness. Right ventricular systolic function is normal. There is moderately elevated pulmonary artery systolic pressure. The tricuspid regurgitant velocity is 2.78 m/s, and with an assumed  right atrial pressure of 15 mmHg, the estimated right ventricular systolic pressure is 45.9 mmHg. Left Atrium: Left atrial size was normal in size. Right Atrium: Right atrial size was mildly dilated. Pericardium: There is no evidence of pericardial effusion. Mitral Valve: The mitral valve is degenerative in appearance. There is moderate thickening of the mitral valve leaflet(s). There is mild calcification of the mitral valve leaflet(s). Moderate mitral annular calcification. Mild mitral valve regurgitation.  No evidence of mitral valve stenosis. MV peak gradient, 8.9 mmHg. The mean mitral valve gradient is 3.0 mmHg. Tricuspid Valve: The tricuspid valve is not well visualized. Tricuspid valve regurgitation is trivial. Aortic Valve: The aortic valve has an indeterminant number of cusps. There is mild calcification of the aortic valve. There is moderate thickening of the aortic valve. Aortic valve regurgitation is not visualized. Mild to moderate aortic valve sclerosis/calcification is present, without any evidence of  aortic stenosis. Aortic valve mean gradient measures 5.0 mmHg. Aortic valve peak gradient measures 9.1 mmHg. Aortic valve area, by VTI measures 1.14 cm. Pulmonic Valve: The pulmonic valve was grossly normal. Pulmonic valve regurgitation is not visualized. No evidence of pulmonic stenosis. Aorta: The aortic root is normal in size and structure. Venous: The inferior vena cava is dilated in size with less than 50% respiratory variability, suggesting right atrial pressure of 15 mmHg. IAS/Shunts: The interatrial septum was not well visualized.  LEFT VENTRICLE PLAX 2D LVIDd:         3.70 cm  Diastology LVIDs:         2.30 cm  LV e' medial:    7.18 cm/s LV PW:         1.10 cm  LV E/e' medial:  17.9 LV IVS:        0.90 cm  LV e' lateral:   6.85 cm/s LVOT diam:     2.10 cm  LV E/e' lateral: 18.8 LV SV:         43 LV SV Index:   22 LVOT Area:     3.46 cm  RIGHT VENTRICLE RV Basal diam:  2.70 cm LEFT ATRIUM           Index       RIGHT ATRIUM           Index LA diam:      3.70 cm 1.88 cm/m  RA Area:     19.80 cm LA Vol (A2C): 64.0 ml 32.56 ml/m RA Volume:   51.50 ml  26.20 ml/m LA Vol (A4C): 32.9 ml 16.74 ml/m  AORTIC VALVE                    PULMONIC VALVE AV Area (Vmax):    1.51 cm     PV Vmax:       0.88 m/s AV Area (Vmean):   1.32 cm     PV Vmean:      61.200 cm/s AV Area (VTI):     1.14 cm     PV VTI:        0.186 m AV Vmax:           151.00 cm/s  PV Peak grad:  3.1 mmHg AV Vmean:          110.000 cm/s PV Mean grad:  2.0 mmHg AV VTI:            0.380 m AV Peak Grad:      9.1 mmHg AV Mean Grad:      5.0 mmHg  LVOT Vmax:         66.00 cm/s LVOT Vmean:        41.900 cm/s LVOT VTI:          0.125 m LVOT/AV VTI ratio: 0.33  AORTA Ao Root diam: 3.40 cm MITRAL VALVE                TRICUSPID VALVE MV Area (PHT): 3.10 cm     TR Peak grad:   30.9 mmHg MV Area VTI:   1.23 cm     TR Vmax:        278.00 cm/s MV Peak grad:  8.9 mmHg MV Mean grad:  3.0 mmHg     SHUNTS MV Vmax:       1.49 m/s     Systemic VTI:  0.12 m MV Vmean:       72.6 cm/s    Systemic Diam: 2.10 cm MV Decel Time: 245 msec MV E velocity: 128.50 cm/s Yvonne Kendall MD Electronically signed by Yvonne Kendall MD Signature Date/Time: 06/07/2020/4:00:35 PM    Final    [place] EKG SITE RITE  Result Date: 06/07/2020 If Site Rite image not attached, placement could not be confirmed due to current cardiac rhythm.       Scheduled Meds: . brimonidine  1 drop Both Eyes BID  . calcium carbonate  600 mg of elemental calcium Oral Q breakfast  . calcium-vitamin D  1 tablet Oral Daily  . docusate sodium  100 mg Oral BID  . enoxaparin (LOVENOX) injection  40 mg Subcutaneous Q24H  . levothyroxine  88 mcg Oral QAC breakfast  . pantoprazole  40 mg Oral Daily  . predniSONE  50 mg Oral Q12H  . pyridostigmine  60 mg Oral TID  . timolol  1 drop Both Eyes BID   Continuous Infusions: .  ceFAZolin (ANCEF) IV       LOS: 4 days    Time spent: 35 minutes    Marrion Coy, MD [facility]   To contact the attending provider between 7A-7P or the covering provider during after hours 7P-7A, please log into the web site [email or web address] and access using universal [place] password for that web site. If you do not have the password, please call the hospital operator.  06/08/2020, 2:25 PM

## 2020-06-08 NOTE — TOC Progression Note (Signed)
Transition of Care [facility]) - Progression Note    Patient Details  Name: Gregory Small MRN: [id number] Date of Birth: [birth year]  [facility] CM/SW Contact  Chapman Fitch, RN Phone Number: 06/08/2020, 2:37 PM  Clinical Narrative:     Per ID patient will require minimum of 4 weeks IV antibiotics Confirmed with Harriett Sine at [facility] that it is not within their scope of practice to administer IV antibiotics   Confirmed with Efraim Kaufmann at [facility] [phone] that they can accept patient as early as Thursday and she is aware that patient will require IV antibiotics  Updated daughter Allision.  She is in agreement and has accepted bed at [facility]  Per MD anticipated DC for Friday.  Patient will require insurance auth prior to discharge   Expected Discharge Plan: Skilled Nursing Facility Barriers to Discharge: Continued Medical Work up  Expected Discharge Plan and Services Expected Discharge Plan: Skilled Nursing Facility       Living arrangements for the past 2 months: Assisted Living Facility                                       Social Determinants of Health (SDOH) Interventions    Readmission Risk Interventions Readmission Risk Prevention Plan 06/06/2020  Transportation Screening Complete  PCP or Specialist Appt within 3-5 Days Complete  HRI or Home Care Consult Complete  Social Work Consult for Recovery Care Planning/Counseling Complete  Palliative Care Screening Not Applicable  Medication Review Oceanographer) Complete  Some recent data might be hidden

## 2020-06-08 NOTE — Plan of Care (Signed)
  Problem: Clinical Measurements: Goal: Respiratory complications will improve Outcome: Progressing Goal: Cardiovascular complication will be avoided Outcome: Progressing   Problem: Clinical Measurements: Goal: Cardiovascular complication will be avoided Outcome: Progressing   Problem: Nutrition: Goal: Adequate nutrition will be maintained Outcome: Progressing   Problem: Pain Managment: Goal: General experience of comfort will improve Outcome: Progressing   Problem: Skin Integrity: Goal: Risk for impaired skin integrity will decrease Outcome: Progressing

## 2020-06-08 NOTE — Progress Notes (Signed)
[facility] CLINIC INFECTIOUS DISEASE PROGRESS NOTE Date of Admission:  06/04/2020     ID: Gregory Small is a 87 y.o. male with MSSA bacteremia Principal Problem:   Cellulitis of right upper arm Active Problems:   Myasthenia gravis (HCC)   Atrial fibrillation, chronic (HCC)   Hypothyroidism   Chronic pain syndrome   Fall   CKD (chronic kidney disease), stage IIIa   Sepsis (HCC)   HTN (hypertension)   GERD (gastroesophageal reflux disease)   COVID-19 virus infection   MSSA (methicillin susceptible Staphylococcus aureus) septicemia (HCC)   Subjective: S/p I and D - cx with staph as well. FU BCX neg. Lost IV access so linezolid.  ROS  Eleven systems are reviewed and negative except per hpi  Medications:  Antibiotics Given (last 72 hours)    Date/Time Action Medication Dose Rate   06/05/20 1449 New Bag/Given   ceFAZolin (ANCEF) IVPB 2g/100 mL premix 2 g 200 mL/hr   06/05/20 2117 New Bag/Given   ceFAZolin (ANCEF) IVPB 2g/100 mL premix 2 g 200 mL/hr   06/06/20 0631 New Bag/Given   ceFAZolin (ANCEF) IVPB 2g/100 mL premix 2 g 200 mL/hr   06/06/20 1411 New Bag/Given   ceFAZolin (ANCEF) IVPB 2g/100 mL premix 2 g 200 mL/hr   06/06/20 2141 New Bag/Given   ceFAZolin (ANCEF) IVPB 2g/100 mL premix 2 g 200 mL/hr   06/07/20 0534 New Bag/Given   ceFAZolin (ANCEF) IVPB 2g/100 mL premix 2 g 200 mL/hr   06/07/20 1449 New Bag/Given   ceFAZolin (ANCEF) IVPB 2g/100 mL premix 2 g 200 mL/hr   06/07/20 2237 Given   linezolid (ZYVOX) tablet 600 mg 600 mg    06/08/20 4742 Given   linezolid (ZYVOX) tablet 600 mg 600 mg      . brimonidine  1 drop Both Eyes BID  . calcium carbonate  600 mg of elemental calcium Oral Q breakfast  . calcium-vitamin D  1 tablet Oral Daily  . docusate sodium  100 mg Oral BID  . enoxaparin (LOVENOX) injection  40 mg Subcutaneous Q24H  . levothyroxine  88 mcg Oral QAC breakfast  . pantoprazole  40 mg Oral Daily  . predniSONE  50 mg Oral Q12H  . pyridostigmine  60  mg Oral TID  . timolol  1 drop Both Eyes BID    Objective: Vital signs in last 24 hours: Temp:  [97.7 F (36.5 C)-98 F (36.7 C)] 97.8 F (36.6 C) (04/12 0813) Pulse Rate:  [60-90] 60 (04/12 0813) Resp:  [12-18] 18 (04/12 0813) BP: (132-202)/(60-119) 156/76 (04/12 0813) SpO2:  [97 %-100 %] 100 % (04/12 0813)   Lab Results Recent Labs    06/06/20 0455 06/07/20 0440  NA 139 139  K 3.9 3.9  CL 105 103  CO2 27 28  BUN 25* 24*  CREATININE 0.89 0.92    Microbiology: Constitutional: He is oriented to person, place, and time. HENT: anicteric Mouth/Throat: Oropharynx is clear and moist. No oropharyngeal exudate.  Cardiovascular: Normal rate, regular rhythm and normal heart sounds. Pulmonary/Chest: Effort normal and breath sounds normal. No respiratory distress. He has no wheezes.  Abdominal: Soft. Bowel sounds are normal. He exhibits no distension. There is no tenderness.  Lymphadenopathy: He has no cervical adenopathy.  Neurological: He is alert and oriented to person, place, and time.  Ext R wrist with pain with passive motion Skin:R dorsum hand and wrist with erythema, ttp. Psychiatric: He has a normal mood and affect. His behavior is normal. Studies/Results: ECHOCARDIOGRAM COMPLETE  Result Date: 06/07/2020    ECHOCARDIOGRAM REPORT   Patient Name:   Gregory Small [facility] Date of Exam: 06/07/2020 Medical Rec #:  [id number]           Height:       67.0 in Accession #:    [id number]          Weight:       187.1 lb Date of Birth:  08-Nov-1919            BSA:          1.966 m Patient Age:    89 years            BP:           157/91 mmHg Patient Gender: M                   HR:           56 bpm. Exam Location:  [facility] Procedure: 2D Echo, Color Doppler and Cardiac Doppler Indications:     R78.81 Bacteremia  History:         Patient has no prior history of Echocardiogram examinations.                  Risk Factors:Hypertension. Pt tested positive for COVID-19 on                  06/04/2020.   Sonographer:     Humphrey Rolls RDCS (AE) Referring Phys:  [id number] Marrion Coy Diagnosing Phys: Yvonne Kendall MD  Sonographer Comments: Suboptimal subcostal window. IMPRESSIONS  1. Left ventricular ejection fraction, by estimation, is 55 to 60%. The left ventricle has normal function. The left ventricle has no regional wall motion abnormalities. Left ventricular diastolic parameters are indeterminate.  2. Right ventricular systolic function is normal. The right ventricular size is normal. There is moderately elevated pulmonary artery systolic pressure.  3. Right atrial size was mildly dilated.  4. The mitral valve is degenerative. Mild mitral valve regurgitation. No evidence of mitral stenosis. Moderate mitral annular calcification.  5. The aortic valve has an indeterminant number of cusps. There is mild calcification of the aortic valve. There is moderate thickening of the aortic valve. Aortic valve regurgitation is not visualized. Mild to moderate aortic valve sclerosis/calcification is present, without any evidence of aortic stenosis.  6. The inferior vena cava is dilated in size with <50% respiratory variability, suggesting right atrial pressure of 15 mmHg. FINDINGS  Left Ventricle: Left ventricular ejection fraction, by estimation, is 55 to 60%. The left ventricle has normal function. The left ventricle has no regional wall motion abnormalities. The left ventricular internal cavity size was normal in size. There is  borderline left ventricular hypertrophy. Left ventricular diastolic parameters are indeterminate. Right Ventricle: The right ventricular size is normal. No increase in right ventricular wall thickness. Right ventricular systolic function is normal. There is moderately elevated pulmonary artery systolic pressure. The tricuspid regurgitant velocity is 2.78 m/s, and with an assumed right atrial pressure of 15 mmHg, the estimated right ventricular systolic pressure is 45.9 mmHg. Left Atrium: Left atrial  size was normal in size. Right Atrium: Right atrial size was mildly dilated. Pericardium: There is no evidence of pericardial effusion. Mitral Valve: The mitral valve is degenerative in appearance. There is moderate thickening of the mitral valve leaflet(s). There is mild calcification of the mitral valve leaflet(s). Moderate mitral annular calcification. Mild mitral valve regurgitation.  No evidence of mitral valve stenosis. MV peak gradient,  8.9 mmHg. The mean mitral valve gradient is 3.0 mmHg. Tricuspid Valve: The tricuspid valve is not well visualized. Tricuspid valve regurgitation is trivial. Aortic Valve: The aortic valve has an indeterminant number of cusps. There is mild calcification of the aortic valve. There is moderate thickening of the aortic valve. Aortic valve regurgitation is not visualized. Mild to moderate aortic valve sclerosis/calcification is present, without any evidence of aortic stenosis. Aortic valve mean gradient measures 5.0 mmHg. Aortic valve peak gradient measures 9.1 mmHg. Aortic valve area, by VTI measures 1.14 cm. Pulmonic Valve: The pulmonic valve was grossly normal. Pulmonic valve regurgitation is not visualized. No evidence of pulmonic stenosis. Aorta: The aortic root is normal in size and structure. Venous: The inferior vena cava is dilated in size with less than 50% respiratory variability, suggesting right atrial pressure of 15 mmHg. IAS/Shunts: The interatrial septum was not well visualized.  LEFT VENTRICLE PLAX 2D LVIDd:         3.70 cm  Diastology LVIDs:         2.30 cm  LV e' medial:    7.18 cm/s LV PW:         1.10 cm  LV E/e' medial:  17.9 LV IVS:        0.90 cm  LV e' lateral:   6.85 cm/s LVOT diam:     2.10 cm  LV E/e' lateral: 18.8 LV SV:         43 LV SV Index:   22 LVOT Area:     3.46 cm  RIGHT VENTRICLE RV Basal diam:  2.70 cm LEFT ATRIUM           Index       RIGHT ATRIUM           Index LA diam:      3.70 cm 1.88 cm/m  RA Area:     19.80 cm LA Vol (A2C): 64.0  ml 32.56 ml/m RA Volume:   51.50 ml  26.20 ml/m LA Vol (A4C): 32.9 ml 16.74 ml/m  AORTIC VALVE                    PULMONIC VALVE AV Area (Vmax):    1.51 cm     PV Vmax:       0.88 m/s AV Area (Vmean):   1.32 cm     PV Vmean:      61.200 cm/s AV Area (VTI):     1.14 cm     PV VTI:        0.186 m AV Vmax:           151.00 cm/s  PV Peak grad:  3.1 mmHg AV Vmean:          110.000 cm/s PV Mean grad:  2.0 mmHg AV VTI:            0.380 m AV Peak Grad:      9.1 mmHg AV Mean Grad:      5.0 mmHg LVOT Vmax:         66.00 cm/s LVOT Vmean:        41.900 cm/s LVOT VTI:          0.125 m LVOT/AV VTI ratio: 0.33  AORTA Ao Root diam: 3.40 cm MITRAL VALVE                TRICUSPID VALVE MV Area (PHT): 3.10 cm     TR Peak grad:   30.9 mmHg MV Area VTI:  1.23 cm     TR Vmax:        278.00 cm/s MV Peak grad:  8.9 mmHg MV Mean grad:  3.0 mmHg     SHUNTS MV Vmax:       1.49 m/s     Systemic VTI:  0.12 m MV Vmean:      72.6 cm/s    Systemic Diam: 2.10 cm MV Decel Time: 245 msec MV E velocity: 128.50 cm/s Yvonne Kendall MD Electronically signed by Yvonne Kendall MD Signature Date/Time: 06/07/2020/4:00:35 PM    Final    [place] EKG SITE RITE  Result Date: 06/07/2020 If Site Rite image not attached, placement could not be confirmed due to current cardiac rhythm.   Assessment/Plan: Gregory Small is a 87 y.o. male admitted after a fall with R UE cellulitis and MSSA bacteremia.  He has a hx of Myasathenia gravis and is on pred 20 mg daily.   S/p I and D hand 4/11.  He has a THR on R but now pain at site.  FU BCX ngtd TTE with no veg, does have degen MV, mild MR, AV calcification and mod thickening 4/12 = s/p I and D, no fevers, fu bcx neg. Wound cx staph Recommendations Cont cefazolin continuous 6 gm q 8 hours or 2 gm q 8 hours. Will likely need 4 weeks IV abx. May need longer or may need tail oral coverage. Will fu as otp  Thank you very much for the consult. Will follow with you.  Mick Sell   06/08/2020, 1:54 PM

## 2020-06-08 NOTE — Progress Notes (Signed)
Peripherally Inserted Central Catheter Placement  The IV Nurse has discussed with the patient and/or persons authorized to consent for the patient, the purpose of this procedure and the potential benefits and risks involved with this procedure.  The benefits include less needle sticks, lab draws from the catheter, and the patient may be discharged home with the catheter. Risks include, but not limited to, infection, bleeding, blood clot (thrombus formation), and puncture of an artery; nerve damage and irregular heartbeat and possibility to perform a PICC exchange if needed/ordered by physician.  Alternatives to this procedure were also discussed.  Bard Power PICC patient education guide, fact sheet on infection prevention and patient information card has been provided to patient /or left at bedside.    PICC Placement Documentation  PICC Single Lumen 06/08/20 Left Basilic 42 cm 0 cm (Active)  Indication for Insertion or Continuance of Line Prolonged intravenous therapies;Home intravenous therapies (PICC only) 06/08/20 1723  Exposed Catheter (cm) 0 cm 06/08/20 1723  Site Assessment Clean;Dry;Intact 06/08/20 1723  Line Status Flushed;Saline locked;Blood return noted 06/08/20 1723  Dressing Type Transparent 06/08/20 1723  Dressing Status Clean;Dry;Intact 06/08/20 1723  Antimicrobial disc in place? Yes 06/08/20 1723  Dressing Intervention New dressing 06/08/20 1723  Dressing Change Due 06/15/20 06/08/20 1723       Annett Fabian 06/08/2020, 5:25 PM

## 2020-06-08 NOTE — Progress Notes (Signed)
PHARMACY CONSULT NOTE FOR:  OUTPATIENT  PARENTERAL ANTIBIOTIC THERAPY (OPAT)  Indication: MSSA bacteremia and hand infection Regimen: cefazolin 2gm IV q8h End date: 07/03/2020  IV antibiotic discharge orders are pended. To discharging provider:  please sign these orders via discharge navigator,  Select New Orders & click on the button choice - Manage This Unsigned Work.     Thank you for allowing pharmacy to be a part of this patient's care.  Juliette Alcide, PharmD, BCPS.   Work Cell: [phone] 06/08/2020 3:22 PM

## 2020-06-08 NOTE — Progress Notes (Signed)
Infectious Disease Long Term IV Antibiotic Orders Gregory Small 02-May-1919  Diagnosis:  MSSA bacteremia and hand infection  TTE neg., No TEE done  Culture results MSSA   LABS Lab Results  Component Value Date   CREATININE 0.92 06/07/2020   Lab Results  Component Value Date   WBC 13.8 (H) 06/05/2020   HGB 11.2 (L) 06/05/2020   HCT 35.3 (L) 06/05/2020   MCV 97.2 06/05/2020   PLT 169 06/05/2020   Lab Results  Component Value Date   ESRSEDRATE 54 (H) 06/04/2020   Lab Results  Component Value Date   CRP 34.1 (H) 06/04/2020    Allergies:  Allergies  Allergen Reactions  . Simvastatin Other (See Comments)    Myalgias   . Sulfa Antibiotics     Discharge antibiotics Cefazolin   2 grams every 8 hours  PICC Care per protocol Labs weekly while on IV antibiotics -FAX weekly labs to [id number] CBC w diff   Comprehensive met panel CRP   Planned duration of antibiotics 4 weeks  Stop date May 7th Follow up clinic date 3 weeks   Leonel Ramsay, MD

## 2020-06-08 NOTE — Progress Notes (Signed)
Pt. Alert and oriented. Vital sign stable. Denies chest pain. Some mild bilateral expiratory wheeze. Neb tx. Ordered. Respiratory was notified. Day shift Assigned nurse will follow up with respiratory. Pt. Encouraged to use spirometer.

## 2020-06-09 ENCOUNTER — Encounter: Payer: Self-pay | Admitting: Internal Medicine

## 2020-06-09 DIAGNOSIS — L03113 Cellulitis of right upper limb: Secondary | ICD-10-CM

## 2020-06-09 LAB — CBC WITH DIFFERENTIAL/PLATELET
Abs Immature Granulocytes: 0.8 10*3/uL — ABNORMAL HIGH (ref 0.00–0.07)
Basophils Absolute: 0.1 10*3/uL (ref 0.0–0.1)
Basophils Relative: 1 %
Eosinophils Absolute: 0 10*3/uL (ref 0.0–0.5)
Eosinophils Relative: 0 %
HCT: 35.5 % — ABNORMAL LOW (ref 39.0–52.0)
Hemoglobin: 11.4 g/dL — ABNORMAL LOW (ref 13.0–17.0)
Immature Granulocytes: 5 %
Lymphocytes Relative: 7 %
Lymphs Abs: 1.2 10*3/uL (ref 0.7–4.0)
MCH: 30.9 pg (ref 26.0–34.0)
MCHC: 32.1 g/dL (ref 30.0–36.0)
MCV: 96.2 fL (ref 80.0–100.0)
Monocytes Absolute: 0.7 10*3/uL (ref 0.1–1.0)
Monocytes Relative: 5 %
Neutro Abs: 13 10*3/uL — ABNORMAL HIGH (ref 1.7–7.7)
Neutrophils Relative %: 82 %
Platelets: 279 10*3/uL (ref 150–400)
RBC: 3.69 MIL/uL — ABNORMAL LOW (ref 4.22–5.81)
RDW: 15.8 % — ABNORMAL HIGH (ref 11.5–15.5)
WBC: 15.7 10*3/uL — ABNORMAL HIGH (ref 4.0–10.5)
nRBC: 0 % (ref 0.0–0.2)

## 2020-06-09 LAB — BASIC METABOLIC PANEL
Anion gap: 9 (ref 5–15)
BUN: 31 mg/dL — ABNORMAL HIGH (ref 8–23)
CO2: 28 mmol/L (ref 22–32)
Calcium: 8.7 mg/dL — ABNORMAL LOW (ref 8.9–10.3)
Chloride: 100 mmol/L (ref 98–111)
Creatinine, Ser: 1.23 mg/dL (ref 0.61–1.24)
GFR, Estimated: 53 mL/min — ABNORMAL LOW (ref >60)
Glucose, Bld: 129 mg/dL — ABNORMAL HIGH (ref 70–99)
Potassium: 4.4 mmol/L (ref 3.5–5.1)
Sodium: 137 mmol/L (ref 135–145)

## 2020-06-09 LAB — MAGNESIUM: Magnesium: 2.1 mg/dL (ref 1.7–2.4)

## 2020-06-09 NOTE — Anesthesia Postprocedure Evaluation (Signed)
Anesthesia Post Note  Patient: WENDELL NICOSON  Procedure(s) Performed: IRRIGATION AND DEBRIDEMENT WOUND-Right Hand and Wrist (Right Hand)  Patient location during evaluation: PACU Anesthesia Type: General Level of consciousness: awake and alert Pain management: pain level controlled Vital Signs Assessment: post-procedure vital signs reviewed and stable Respiratory status: spontaneous breathing, nonlabored ventilation, respiratory function stable and patient connected to nasal cannula oxygen Cardiovascular status: blood pressure returned to baseline and stable Postop Assessment: no apparent nausea or vomiting Anesthetic complications: no   No complications documented.   Last Vitals:  Vitals:   06/09/20 0503 06/09/20 1104  BP: (!) 156/88 (!) 156/79  Pulse: (!) 55 74  Resp: 16 18  Temp: (!) 36.3 C (!) 36.4 C  SpO2: 99% 98%    Last Pain:  Vitals:   06/09/20 0900  TempSrc:   PainSc: 9                  Yevette Edwards

## 2020-06-09 NOTE — Progress Notes (Signed)
[facility] CLINIC INFECTIOUS DISEASE PROGRESS NOTE Date of Admission:  06/04/2020     ID: Gregory Small is a 87 y.o. male with MSSA bacteremia Principal Problem:   Cellulitis of right upper arm Active Problems:   Myasthenia gravis (HCC)   Atrial fibrillation, chronic (HCC)   Hypothyroidism   Chronic pain syndrome   Fall   CKD (chronic kidney disease), stage IIIa   HTN (hypertension)   GERD (gastroesophageal reflux disease)   COVID-19 virus infection   MSSA (methicillin susceptible Staphylococcus aureus) septicemia (HCC)   Subjective: No fevers, wbc 15.  S/p I and D - cx with staph as well. FU BCX neg. Hand still stiff but improving  ROS  Eleven systems are reviewed and negative except per hpi  Medications:  Antibiotics Given (last 72 hours)    Date/Time Action Medication Dose Rate   06/06/20 1411 New Bag/Given   ceFAZolin (ANCEF) IVPB 2g/100 mL premix 2 g 200 mL/hr   06/06/20 2141 New Bag/Given   ceFAZolin (ANCEF) IVPB 2g/100 mL premix 2 g 200 mL/hr   06/07/20 0534 New Bag/Given   ceFAZolin (ANCEF) IVPB 2g/100 mL premix 2 g 200 mL/hr   06/07/20 1449 New Bag/Given   ceFAZolin (ANCEF) IVPB 2g/100 mL premix 2 g 200 mL/hr   06/07/20 2237 Given   linezolid (ZYVOX) tablet 600 mg 600 mg    06/08/20 2876 Given   linezolid (ZYVOX) tablet 600 mg 600 mg    06/08/20 2136 New Bag/Given   ceFAZolin (ANCEF) IVPB 2g/100 mL premix 2 g 200 mL/hr   06/09/20 0539 New Bag/Given   ceFAZolin (ANCEF) IVPB 2g/100 mL premix 2 g 200 mL/hr     . brimonidine  1 drop Both Eyes BID  . calcium carbonate  600 mg of elemental calcium Oral Q breakfast  . calcium-vitamin D  1 tablet Oral Daily  . Chlorhexidine Gluconate Cloth  6 each Topical Daily  . docusate sodium  100 mg Oral BID  . enoxaparin (LOVENOX) injection  40 mg Subcutaneous Q24H  . levothyroxine  88 mcg Oral QAC breakfast  . pantoprazole  40 mg Oral Daily  . predniSONE  50 mg Oral Q12H  . pyridostigmine  60 mg Oral TID  . sodium  chloride flush  10-40 mL Intracatheter Q12H  . timolol  1 drop Both Eyes BID    Objective: Vital signs in last 24 hours: Temp:  [97.3 F (36.3 C)-97.8 F (36.6 C)] 97.5 F (36.4 C) (04/13 1104) Pulse Rate:  [55-74] 74 (04/13 1104) Resp:  [16-20] 18 (04/13 1104) BP: (113-156)/(72-88) 156/79 (04/13 1104) SpO2:  [97 %-99 %] 98 % (04/13 1104) Constitutional: He is oriented to person, place, and time. HENT: anicteric Mouth/Throat: Oropharynx is clear and moist. No oropharyngeal exudate.  Cardiovascular: Normal rate, regular rhythm and normal heart sounds. Pulmonary/Chest: Effort normal and breath sounds normal. No respiratory distress. He has no wheezes.  Abdominal: Soft. Bowel sounds are normal. He exhibits no distension. There is no tenderness.  Lymphadenopathy: He has no cervical adenopathy.  Neurological: He is alert and oriented to person, place, and time.  Ext R wrist with pain with passive motion Skin:hand wrapped postop Psychiatric: He has a normal mood and affect. His behavior is normal.   Lab Results Recent Labs    06/07/20 0440 06/09/20 0426  WBC  --  15.7*  HGB  --  11.4*  HCT  --  35.5*  NA 139 137  K 3.9 4.4  CL 103 100  CO2 28  28  BUN 24* 31*  CREATININE 0.92 1.23    Microbiology: Constitutional: He is oriented to person, place, and time. HENT: anicteric Mouth/Throat: Oropharynx is clear and moist. No oropharyngeal exudate.  Cardiovascular: Normal rate, regular rhythm and normal heart sounds. Pulmonary/Chest: Effort normal and breath sounds normal. No respiratory distress. He has no wheezes.  Abdominal: Soft. Bowel sounds are normal. He exhibits no distension. There is no tenderness.  Lymphadenopathy: He has no cervical adenopathy.  Neurological: He is alert and oriented to person, place, and time.  Ext R wrist with pain with passive motion Skin:R dorsum hand and wrist wrapped post op Psychiatric: He has a normal mood and affect. His behavior is  normal. Studies/Results: DG Chest Port 1 View  Result Date: 06/08/2020 CLINICAL DATA:  PICC placement. EXAM: PORTABLE CHEST 1 VIEW COMPARISON:  June 04, 2020. FINDINGS: Stable cardiomediastinal silhouette. Interval placement of left-sided PICC line with distal tip in expected position of right atrium. No pneumothorax or pleural effusion is noted. Minimal bibasilar subsegmental atelectasis is noted. Bony thorax is unremarkable. IMPRESSION: Interval placement of left-sided PICC line with distal tip in expected position of right atrium. Electronically Signed   By: Lupita Raider M.D.   On: 06/08/2020 17:42   [place] EKG SITE RITE  Result Date: 06/07/2020 If Site Rite image not attached, placement could not be confirmed due to current cardiac rhythm.   Gregory Small is a 87 y.o. male admitted after a fall with R UE cellulitis and MSSA bacteremia.  He has a hx of Myasathenia gravis and is on pred 20 mg daily.   Covid + on admit. S/p I and D hand 4/11.  He has a THR on R but now pain at site.  FU BCX ngtd TTE with no veg, does have degen MV, mild MR, AV calcification and mod thickening 4/12 = s/p I and D, no fevers, fu bcx neg. Wound cx staph 4/13 clinically improving.  Recommendations Cont cefazolin continuous 6 gm q 8 hours or 2 gm q 8 hours. - PICC placed and see OPAT note Will likely need 4 weeks IV abx. May need longer or may need tail oral coverage. Will fu as otp  Thank you very much for the consult. Will follow with you.  Mick Sell   06/09/2020, 1:36 PM

## 2020-06-09 NOTE — Progress Notes (Signed)
PROGRESS NOTE    Gregory Small  OFH:[id number] DOB: [birth year] DOA: 06/04/2020 PCP: [facility]    CC; right hand swelling Brief Narrative:  Gregory Small a 87 y.o.malewith medical history significant ofhypertension, GERD, hypothyroidism, vertigo, atrial fibrillation on anticoagulants, chronic pain syndrome, CKD stage IIIa,myasthenia gravis after receiving 2 doses of COVID vaccine,who presents with a fall, pain in right hip and right knee, right arm pain and redness. Upon arriving the hospital, he was Covid positive. Right hand was swelling and tender. He was diagnosed with cellulitis of right hand, start vancomycin and Rocephin. Blood culture came back with MSSA, antibiotic switched to cefazolin. Surgical I &D performed 4/11.   Assessment & Plan:   Principal Problem:   Cellulitis of right upper arm Active Problems:   Myasthenia gravis (HCC)   Atrial fibrillation, chronic (HCC)   Hypothyroidism   Chronic pain syndrome   Fall   CKD (chronic kidney disease), stage IIIa   HTN (hypertension)   GERD (gastroesophageal reflux disease)   COVID-19 virus infection   MSSA (methicillin susceptible Staphylococcus aureus) septicemia (HCC)  #1. Sepsis with MSSA septicemia secondary to right arm cellulitis Right arm cellulitis with small right hand abscess due to MSSA..  Status post I&D 4/11. Patient blood culture was positive for MRSA, repeat culture was negative. Echocardiogram did not show any valvular changes.  Patient is currently being treated with cefazolin.  He received 2 doses of linezolid when he lost IV.  PICC line placed. Plan: --cont cefazolin for a total of 4 weeks  Acute kidney injury, improved --encourage oral hydration  2.  Myasthenia gravis pain Stable  3.  Covid infection. Still asymptomatic.  4.  Chronic atrial fibrillation.   Not chronically on anticoagulation, no tachycardia.  Hypothyroidism --cont synthroid  HTN --home  lasix held   DVT prophylaxis: Lovenox Code Status: DNR Family Communication:  Disposition Plan:  .   Status is: Inpatient  Remains inpatient appropriate because:Inpatient level of care appropriate due to severity of illness   Dispo: The patient is from: ALF              Anticipated d/c is to: SNF              Patient currently is not medically stable to d/c.   Difficult to place patient No   I/O last 3 completed shifts: In: 580 [P.O.:480; IV Piggyback:100] Out: 200 [Urine:200] Total I/O In: 428.6 [P.O.:240; IV Piggyback:188.6] Out: 100 [Urine:100]     Consultants:   ID, orthopedics  Procedures: I &D  Antimicrobials: Cefazolin.  Subjective: Pt complained of right arm pain and swelling.  Having normal oral intake, urination.   Objective: Vitals:   06/09/20 0118 06/09/20 0503 06/09/20 1104 06/09/20 1552  BP: 113/72 (!) 156/88 (!) 156/79 (!) 143/72  Pulse: (!) 56 (!) 55 74 63  Resp: 20 16 18 18   Temp: 97.8 F (36.6 C) (!) 97.3 F (36.3 C) (!) 97.5 F (36.4 C) 98.2 F (36.8 C)  TempSrc:      SpO2: 99% 99% 98% 96%  Weight:      Height:        Intake/Output Summary (Last 24 hours) at 06/09/2020 1631 Last data filed at 06/09/2020 1525 Gross per 24 hour  Intake 648.59 ml  Output 100 ml  Net 548.59 ml   Filed Weights   06/04/20 0924  Weight: 84.9 kg    Examination:  Constitutional: NAD, AAOx3 HEENT: conjunctivae and lids normal, EOMI CV: No cyanosis.  RESP: normal respiratory effort, on RA Extremities: No effusions, edema in BLE.  Right forearm wrapped in ACE wrap, right fingers swollen SKIN: warm, dry Neuro: II - XII grossly intact.   Psych: Normal mood and affect.  Appropriate judgement and reason    Data Reviewed: I have personally reviewed following labs and imaging studies  CBC: Recent Labs  Lab 06/04/20 0939 06/05/20 0524 06/09/20 0426  WBC 15.8* 13.8* 15.7*  NEUTROABS 12.9*  --  13.0*  HGB 12.4* 11.2* 11.4*  HCT 39.5 35.3*  35.5*  MCV 97.1 97.2 96.2  PLT 186 169 279   Basic Metabolic Panel: Recent Labs  Lab 06/04/20 0939 06/05/20 0524 06/06/20 0455 06/07/20 0440 06/09/20 0426  NA 137 138 139 139 137  K 4.9 3.6 3.9 3.9 4.4  CL 100 104 105 103 100  CO2 28 26 27 28 28   GLUCOSE 118* 92 120* 115* 129*  BUN 23 23 25* 24* 31*  CREATININE 1.48* 1.03 0.89 0.92 1.23  CALCIUM 8.9 8.5* 8.4* 8.4* 8.7*  MG  --   --   --   --  2.1   GFR: Estimated Creatinine Clearance: 34.9 mL/min (by C-G formula based on SCr of 1.23 mg/dL). Liver Function Tests: Recent Labs  Lab 06/04/20 0939  AST 48*  ALT 21  ALKPHOS 55  BILITOT 1.9*  PROT 6.8  ALBUMIN 3.4*   No results for input(s): LIPASE, AMYLASE in the last 168 hours. No results for input(s): AMMONIA in the last 168 hours. Coagulation Profile: No results for input(s): INR, PROTIME in the last 168 hours. Cardiac Enzymes: No results for input(s): CKTOTAL, CKMB, CKMBINDEX, TROPONINI in the last 168 hours. BNP (last 3 results) No results for input(s): PROBNP in the last 8760 hours. HbA1C: No results for input(s): HGBA1C in the last 72 hours. CBG: No results for input(s): GLUCAP in the last 168 hours. Lipid Profile: No results for input(s): CHOL, HDL, LDLCALC, TRIG, CHOLHDL, LDLDIRECT in the last 72 hours. Thyroid Function Tests: No results for input(s): TSH, T4TOTAL, FREET4, T3FREE, THYROIDAB in the last 72 hours. Anemia Panel: No results for input(s): VITAMINB12, FOLATE, FERRITIN, TIBC, IRON, RETICCTPCT in the last 72 hours. Sepsis Labs: Recent Labs  Lab 06/04/20 1129 06/04/20 1140  PROCALCITON  --  1.12  LATICACIDVEN 1.5  --     Recent Results (from the past 240 hour(s))  Culture, blood (routine x 2)     Status: Abnormal   Collection Time: 06/04/20 11:40 AM   Specimen: BLOOD RIGHT ARM  Result Value Ref Range Status   Specimen Description   Final    BLOOD RIGHT ARM Performed at [facility], [address], [place], [place]     Special Requests   Final    BAA Blood Culture adequate volume Performed at [facility], [address] [place], [place]    Culture  Setup Time   Final    GRAM POSITIVE COCCI ANAEROBIC BOTTLE ONLY Organism ID to follow CRITICAL RESULT CALLED TO, READ BACK BY AND VERIFIED WITH: [place] [address] ON 06/05/20 BY GM Performed at [facility], [address] [place], [place]    Culture STAPHYLOCOCCUS AUREUS (A)  Final   Report Status 06/07/2020 FINAL  Final   Organism ID, Bacteria STAPHYLOCOCCUS AUREUS  Final      Susceptibility   Staphylococcus aureus - MIC*    CIPROFLOXACIN <=0.5 SENSITIVE Sensitive     ERYTHROMYCIN <=0.25 SENSITIVE Sensitive  GENTAMICIN <=0.5 SENSITIVE Sensitive     OXACILLIN 0.5 SENSITIVE Sensitive     TETRACYCLINE <=1 SENSITIVE Sensitive     VANCOMYCIN <=0.5 SENSITIVE Sensitive     TRIMETH/SULFA <=10 SENSITIVE Sensitive     CLINDAMYCIN <=0.25 SENSITIVE Sensitive     RIFAMPIN <=0.5 SENSITIVE Sensitive     Inducible Clindamycin NEGATIVE Sensitive     * STAPHYLOCOCCUS AUREUS  Blood Culture ID Panel (Reflexed)     Status: Abnormal   Collection Time: 06/04/20 11:40 AM  Result Value Ref Range Status   Enterococcus faecalis NOT DETECTED NOT DETECTED Final   Enterococcus Faecium NOT DETECTED NOT DETECTED Final   Listeria monocytogenes NOT DETECTED NOT DETECTED Final   Staphylococcus species DETECTED (A) NOT DETECTED Final    Comment: CRITICAL RESULT CALLED TO, READ BACK BY AND VERIFIED WITH: A.CHAPPEL,PHARMD AT 0917 ON 06/05/20 BY GM    Staphylococcus aureus (BCID) DETECTED (A) NOT DETECTED Final    Comment: CRITICAL RESULT CALLED TO, READ BACK BY AND VERIFIED WITH: A.CHAPPEL,PHARMD AT 0917 ON 06/05/20 BY GM    Staphylococcus epidermidis NOT DETECTED NOT DETECTED Final   Staphylococcus lugdunensis NOT DETECTED NOT DETECTED Final   Streptococcus species NOT DETECTED NOT DETECTED Final   Streptococcus agalactiae NOT  DETECTED NOT DETECTED Final   Streptococcus pneumoniae NOT DETECTED NOT DETECTED Final   Streptococcus pyogenes NOT DETECTED NOT DETECTED Final   A.calcoaceticus-baumannii NOT DETECTED NOT DETECTED Final   Bacteroides fragilis NOT DETECTED NOT DETECTED Final   Enterobacterales NOT DETECTED NOT DETECTED Final   Enterobacter cloacae complex NOT DETECTED NOT DETECTED Final   Escherichia coli NOT DETECTED NOT DETECTED Final   Klebsiella aerogenes NOT DETECTED NOT DETECTED Final   Klebsiella oxytoca NOT DETECTED NOT DETECTED Final   Klebsiella pneumoniae NOT DETECTED NOT DETECTED Final   Proteus species NOT DETECTED NOT DETECTED Final   Salmonella species NOT DETECTED NOT DETECTED Final   Serratia marcescens NOT DETECTED NOT DETECTED Final   Haemophilus influenzae NOT DETECTED NOT DETECTED Final   Neisseria meningitidis NOT DETECTED NOT DETECTED Final   Pseudomonas aeruginosa NOT DETECTED NOT DETECTED Final   Stenotrophomonas maltophilia NOT DETECTED NOT DETECTED Final   Candida albicans NOT DETECTED NOT DETECTED Final   Candida auris NOT DETECTED NOT DETECTED Final   Candida glabrata NOT DETECTED NOT DETECTED Final   Candida krusei NOT DETECTED NOT DETECTED Final   Candida parapsilosis NOT DETECTED NOT DETECTED Final   Candida tropicalis NOT DETECTED NOT DETECTED Final   Cryptococcus neoformans/gattii NOT DETECTED NOT DETECTED Final   Meth resistant mecA/C and MREJ NOT DETECTED NOT DETECTED Final    Comment: Performed at [facility], [address] [place], [place] [address]  SARS CORONAVIRUS 2 (TAT 6-24 HRS) Nasopharyngeal Nasopharyngeal Swab     Status: Abnormal   Collection Time: 06/04/20 11:48 AM   Specimen: Nasopharyngeal Swab  Result Value Ref Range Status   SARS Coronavirus 2 POSITIVE (A) NEGATIVE Final    Comment: (NOTE) SARS-CoV-2 target nucleic acids are DETECTED.  The SARS-CoV-2 RNA is generally detectable in upper and lower respiratory specimens during the  acute phase of infection. Positive results are indicative of the presence of SARS-CoV-2 RNA. Clinical correlation with patient history and other diagnostic information is  necessary to determine patient infection status. Positive results do not rule out bacterial infection or co-infection with other viruses.  The expected result is Negative.  Fact Sheet for Patients: HairSlick.no  Fact Sheet for Healthcare Providers: quierodirigir.com  This test is not  yet approved or cleared by the [place] FDA and  has been authorized for detection and/or diagnosis of SARS-CoV-2 by FDA under an Emergency Use Authorization (EUA). This EUA will remain  in effect (meaning this test can be used) for the duration of the COVID-19 declaration under Section 564(b)(1) of the Act, 21 U. S.C. section 360bbb-3(b)(1), unless the authorization is terminated or revoked sooner.   Performed at [facility], [address] [place], [place] [address]   Culture, blood (Routine X 2) w Reflex to ID Panel     Status: None (Preliminary result)   Collection Time: 06/05/20 10:55 AM   Specimen: BLOOD LEFT HAND  Result Value Ref Range Status   Specimen Description BLOOD LEFT HAND  Final   Special Requests   Final    BOTTLES DRAWN AEROBIC AND ANAEROBIC Blood Culture adequate volume   Culture   Final    NO GROWTH 4 DAYS Performed at [facility], [address] [place], [place] [address]    Report Status PENDING  Incomplete  Culture, blood (Routine X 2) w Reflex to ID Panel     Status: None (Preliminary result)   Collection Time: 06/05/20 11:51 AM   Specimen: BLOOD  Result Value Ref Range Status   Specimen Description BLOOD BLOOD LEFT HAND  Final   Special Requests   Final    BOTTLES DRAWN AEROBIC AND ANAEROBIC Blood Culture adequate volume   Culture   Final    NO GROWTH 4 DAYS Performed at [facility], [address]  [place], [place] [address]    Report Status PENDING  Incomplete  Aerobic/Anaerobic Culture w Gram Stain (surgical/deep wound)     Status: None (Preliminary result)   Collection Time: 06/06/20  5:22 PM   Specimen: Hand; Abscess  Result Value Ref Range Status   Specimen Description   Final    HAND Performed at [facility], [address] [place], [place] [address]    Special Requests   Final    NONE Performed at [facility], [address] [place], [place] [address]    Gram Stain   Final    FEW WBC PRESENT, PREDOMINANTLY PMN NO ORGANISMS SEEN Performed at [facility], [address] [place], [place] [address]    Culture   Final    MODERATE STAPHYLOCOCCUS AUREUS NO ANAEROBES ISOLATED; CULTURE IN PROGRESS FOR 5 DAYS    Report Status PENDING  Incomplete   Organism ID, Bacteria STAPHYLOCOCCUS AUREUS  Final      Susceptibility   Staphylococcus aureus - MIC*    CIPROFLOXACIN <=0.5 SENSITIVE Sensitive     ERYTHROMYCIN <=0.25 SENSITIVE Sensitive     GENTAMICIN <=0.5 SENSITIVE Sensitive     OXACILLIN 0.5 SENSITIVE Sensitive     TETRACYCLINE <=1 SENSITIVE Sensitive     VANCOMYCIN 1 SENSITIVE Sensitive     TRIMETH/SULFA <=10 SENSITIVE Sensitive     CLINDAMYCIN <=0.25 SENSITIVE Sensitive     RIFAMPIN <=0.5 SENSITIVE Sensitive     Inducible Clindamycin NEGATIVE Sensitive     * MODERATE STAPHYLOCOCCUS AUREUS  Aerobic/Anaerobic Culture w Gram Stain (surgical/deep wound)     Status: None (Preliminary result)   Collection Time: 06/07/20  5:04 PM   Specimen: PATH Other; Wound  Result Value Ref Range Status   Specimen Description   Final    FLUID RIGHT WRIST Performed at [facility], [address] [place], [place] [address]    Special Requests  Final    NONE Performed at [facility], [address] [place], [place] [address]    Gram Stain NO WBC SEEN RARE GRAM POSITIVE COCCI   Final   Culture   Final    FEW STAPHYLOCOCCUS  AUREUS SUSCEPTIBILITIES TO FOLLOW Performed at [facility], [address] [place], [place] [address]    Report Status PENDING  Incomplete  Aerobic/Anaerobic Culture w Gram Stain (surgical/deep wound)     Status: None (Preliminary result)   Collection Time: 06/07/20  5:04 PM   Specimen: PATH Other; Wound  Result Value Ref Range Status   Specimen Description WOUND RIGHT HAND  Final   Special Requests NONE  Final   Gram Stain   Final    RARE WBC PRESENT,BOTH PMN AND MONONUCLEAR NO ORGANISMS SEEN    Culture   Final    FEW STAPHYLOCOCCUS AUREUS SUSCEPTIBILITIES TO FOLLOW Performed at [facility], [address], [place], [place] [address]    Report Status PENDING  Incomplete         Radiology Studies: DG Chest Port 1 View  Result Date: 06/08/2020 CLINICAL DATA:  PICC placement. EXAM: PORTABLE CHEST 1 VIEW COMPARISON:  June 04, 2020. FINDINGS: Stable cardiomediastinal silhouette. Interval placement of left-sided PICC line with distal tip in expected position of right atrium. No pneumothorax or pleural effusion is noted. Minimal bibasilar subsegmental atelectasis is noted. Bony thorax is unremarkable. IMPRESSION: Interval placement of left-sided PICC line with distal tip in expected position of right atrium. Electronically Signed   By: Lupita Raider M.D.   On: 06/08/2020 17:42        Scheduled Meds: . brimonidine  1 drop Both Eyes BID  . calcium carbonate  600 mg of elemental calcium Oral Q breakfast  . calcium-vitamin D  1 tablet Oral Daily  . Chlorhexidine Gluconate Cloth  6 each Topical Daily  . docusate sodium  100 mg Oral BID  . enoxaparin (LOVENOX) injection  40 mg Subcutaneous Q24H  . levothyroxine  88 mcg Oral QAC breakfast  . pantoprazole  40 mg Oral Daily  . predniSONE  50 mg Oral Q12H  . pyridostigmine  60 mg Oral TID  . sodium chloride flush  10-40 mL Intracatheter Q12H  . timolol  1 drop Both Eyes BID   Continuous Infusions: .  ceFAZolin  (ANCEF) IV 200 mL/hr at 06/09/20 1525     LOS: 5 days     Darlin Priestly, MD [facility]   To contact the attending provider between 7A-7P or the covering provider during after hours 7P-7A, please log into the web site [email or web address] and access using universal [place] password for that web site. If you do not have the password, please call the hospital operator.  06/09/2020, 4:31 PM

## 2020-06-09 NOTE — TOC Progression Note (Signed)
Transition of Care [facility]) - Progression Note    Patient Details  Name: Gregory Small MRN: [id number] Date of Birth: [birth year]  [facility] CM/SW Contact  Chapman Fitch, RN Phone Number: 06/09/2020, 4:22 PM  Clinical Narrative:    Attempted to start auth through [employer] portal.  Confirmed with [facility] that patient is not managed by [facility].  notifie Melissa at [facility].  She will start auth.  Updated clinical faxed to [facility]    Expected Discharge Plan: Skilled Nursing Facility Barriers to Discharge: Continued Medical Work up  Expected Discharge Plan and Services Expected Discharge Plan: Skilled Nursing Facility       Living arrangements for the past 2 months: Assisted Living Facility                                       Social Determinants of Health (SDOH) Interventions    Readmission Risk Interventions Readmission Risk Prevention Plan 06/06/2020  Transportation Screening Complete  PCP or Specialist Appt within 3-5 Days Complete  HRI or Home Care Consult Complete  Social Work Consult for Recovery Care Planning/Counseling Complete  Palliative Care Screening Not Applicable  Medication Review [facility]) Complete  Some recent data might be hidden

## 2020-06-09 NOTE — Plan of Care (Signed)
  Problem: Clinical Measurements: Goal: Respiratory complications will improve Outcome: Progressing Goal: Cardiovascular complication will be avoided Outcome: Progressing   Problem: Nutrition: Goal: Adequate nutrition will be maintained Outcome: Progressing   Problem: Pain Managment: Goal: General experience of comfort will improve Outcome: Progressing   Problem: Safety: Goal: Ability to remain free from injury will improve Outcome: Progressing   Problem: Skin Integrity: Goal: Risk for impaired skin integrity will decrease Outcome: Progressing   

## 2020-06-09 NOTE — Progress Notes (Signed)
Spoke with daughter Revonda Standard over the phone. Update given.   Madie Reno, RN

## 2020-06-09 NOTE — Progress Notes (Signed)
Physical Therapy Treatment Patient Details Name: Gregory Small MRN: [id number] DOB: 02-Oct-1919 Today's Date: 06/09/2020    History of Present Illness 87 yo male with onset of RUE cellulitis from unknown source was brought to ED, after a fall at his ALF which was unwitnessed.  Pt is having some joint pain with no diagnosed injuries.  Noted sepsis, current Covid infection, falls.  PMHx:  recent Covid dx, myasthenia gravis, vertigo, a-fib, GERD, hypothyroidism, HTN, chronic pain, spinal surgery with DJD Lumbar spine, falls, CKD 3a    PT Comments    Pt was long sitting in bed with RN in room giving medications. Pt is oriented to self however needs reorientation to hospital,time, situation. He endorses 8-9/10 pain in RUE. MD messaged to clarify wt bearing orders for RUE. Assumed NWB throughout session for safety. Pt required mod assist to exit bed. Sat EOB x several minutes performing there ex prior to standing and ambulating to doorway of room with constant min-mod assist + max vcs. Poor posture during gait training and heavy use of gait belt for safety. Pt fatigued quickly however sao2 and HR stable. Pt will greatly benefit from SNF at DC to address deficits while assisting pt to PLOF.     Follow Up Recommendations  SNF;Supervision/Assistance - 24 hour;Supervision for mobility/OOB     Equipment Recommendations  Other (comment) (defer to next level of care)       Precautions / Restrictions Precautions Precautions: Fall Precaution Comments: monitor HR and sats Restrictions Weight Bearing Restrictions: Yes RUE Weight Bearing: Non weight bearing ? MD messaged to clarify wt bearing restrictions   Mobility  Bed Mobility Overal bed mobility: Needs Assistance Bed Mobility: Supine to Sit;Sit to Supine Rolling: Mod assist   Supine to sit: Mod assist Sit to supine: Mod assist   General bed mobility comments: Pt required mod assist + max vcs for technique and sequencing to exit L sid eof  bed    Transfers Overall transfer level: Needs assistance Equipment used: 1 person hand held assist Transfers: Sit to/from Stand Sit to Stand: Min assist;From elevated surface         General transfer comment: Pt required min assist to stand from elevated bed height. Needs constant vcs for not using RUE.  Ambulation/Gait Ambulation/Gait assistance: Min assist;Mod assist Gait Distance (Feet): 25 Feet Assistive device: 1 person hand held assist (heavy use of gait belt for safety) Gait Pattern/deviations: Step-through pattern;Trunk flexed;Decreased step length - right;Decreased step length - left;Shuffle Gait velocity: decreased   General Gait Details: Patient ambulated to room door and back to bed. Fatigued, slightly sob.      Balance Overall balance assessment: Needs assistance Sitting-balance support: Feet supported Sitting balance-Leahy Scale: Good     Standing balance support: Single extremity supported;During functional activity Standing balance-Leahy Scale: Poor Standing balance comment: high fall risk       Cognition Arousal/Alertness: Awake/alert Behavior During Therapy: WFL for tasks assessed/performed Overall Cognitive Status: No family/caregiver present to determine baseline cognitive functioning Area of Impairment: Orientation;Following commands;Safety/judgement;Awareness;Problem solving      General Comments: Pt is oriented to self. Needs guiding to realize he is in [place]. unaware of situation but was able to follow simple one step commands consistently         General Comments General comments (skin integrity, edema, etc.): reviewed ther ex and pt performed. needs increased time to perform and step by step vcs      Pertinent Vitals/Pain Pain Assessment: 0-10 Pain Score: 8  Faces  Pain Scale: Hurts whole lot Pain Location: R UE Pain Descriptors / Indicators: Discomfort Pain Intervention(s): Limited activity within patient's tolerance;Premedicated before  session;Monitored during session           PT Goals (current goals can now be found in the care plan section) Acute Rehab PT Goals Patient Stated Goal: none stated Progress towards PT goals: Not progressing toward goals - comment (cognition/RUE limiting progression)    Frequency    Min 2X/week      PT Plan Discharge plan needs to be updated       AM-PAC PT "6 Clicks" Mobility   Outcome Measure  Help needed turning from your back to your side while in a flat bed without using bedrails?: A Lot Help needed moving from lying on your back to sitting on the side of a flat bed without using bedrails?: A Lot Help needed moving to and from a bed to a chair (including a wheelchair)?: A Lot Help needed standing up from a chair using your arms (e.g., wheelchair or bedside chair)?: A Lot Help needed to walk in hospital room?: A Lot Help needed climbing 3-5 steps with a railing? : A Lot 6 Click Score: 12    End of Session Equipment Utilized During Treatment: Gait belt Activity Tolerance: Patient limited by fatigue Patient left: in bed;with call bell/phone within reach;with bed alarm set Nurse Communication: Mobility status PT Visit Diagnosis: Muscle weakness (generalized) (M62.81);Difficulty in walking, not elsewhere classified (R26.2);History of falling (Z91.81)     Time: 0762-2633 PT Time Calculation (min) (ACUTE ONLY): 23 min  Charges:  $Gait Training: 8-22 mins $Therapeutic Activity: 8-22 mins                     Jetta Lout PTA 06/09/20, 9:47 AM

## 2020-06-10 DIAGNOSIS — L03113 Cellulitis of right upper limb: Secondary | ICD-10-CM | POA: Diagnosis not present

## 2020-06-10 LAB — CULTURE, BLOOD (ROUTINE X 2)
Culture: NO GROWTH
Culture: NO GROWTH
Special Requests: ADEQUATE
Special Requests: ADEQUATE

## 2020-06-10 LAB — BASIC METABOLIC PANEL
Anion gap: 10 (ref 5–15)
BUN: 30 mg/dL — ABNORMAL HIGH (ref 8–23)
CO2: 27 mmol/L (ref 22–32)
Calcium: 8.4 mg/dL — ABNORMAL LOW (ref 8.9–10.3)
Chloride: 101 mmol/L (ref 98–111)
Creatinine, Ser: 1.09 mg/dL (ref 0.61–1.24)
GFR, Estimated: >60 mL/min (ref >60)
Glucose, Bld: 124 mg/dL — ABNORMAL HIGH (ref 70–99)
Potassium: 4.5 mmol/L (ref 3.5–5.1)
Sodium: 138 mmol/L (ref 135–145)

## 2020-06-10 LAB — CBC
HCT: 35.7 % — ABNORMAL LOW (ref 39.0–52.0)
Hemoglobin: 11.5 g/dL — ABNORMAL LOW (ref 13.0–17.0)
MCH: 30.9 pg (ref 26.0–34.0)
MCHC: 32.2 g/dL (ref 30.0–36.0)
MCV: 96 fL (ref 80.0–100.0)
Platelets: 292 10*3/uL (ref 150–400)
RBC: 3.72 MIL/uL — ABNORMAL LOW (ref 4.22–5.81)
RDW: 15.8 % — ABNORMAL HIGH (ref 11.5–15.5)
WBC: 14 10*3/uL — ABNORMAL HIGH (ref 4.0–10.5)
nRBC: 0 % (ref 0.0–0.2)

## 2020-06-10 LAB — MAGNESIUM: Magnesium: 2.3 mg/dL (ref 1.7–2.4)

## 2020-06-10 NOTE — Progress Notes (Signed)
Occupational Therapy Treatment Patient Details Name: Gregory Small MRN: [id number] DOB: March 29, 1919 Today's Date: 06/10/2020    History of present illness 87 yo male with onset of RUE cellulitis from unknown source was brought to ED, after a fall at his ALF which was unwitnessed.  Pt is having some joint pain with no diagnosed injuries.  Noted sepsis, current Covid infection, falls.  PMHx:  recent Covid dx, myasthenia gravis, vertigo, a-fib, GERD, hypothyroidism, HTN, chronic pain, spinal surgery with DJD Lumbar spine, falls, CKD 3a. S/p I&D to R wrist on 4/13 with C$RemoveBe'@T'TmpKppMMP$  order in place. Platform walker in room.   OT comments  Pt seen for OT treatment this date to f/u re: safety with ADLs/ADL mobility. Pt willing to get up to chair to better be able to eat his dinner. OT engages pt in seated grooming tasks such as hand hygiene in prep for meal and pt only requires SETUP. Pt requires MOD/MAX A to adjust socks in prep for transfer/mobility d/t limited LE ROM. OT educates re: importance of trying to preserve as much hip mobility and general LB ROM as possible. Pt with good understanding, but limited carryover d/t some lingering confusion. Pt requires MIN A/CGA for SPS from bed to chair with use of R platform walker from slightly elevated bed surface (~3"). Pt left in chair with all needs met and in reach, OT informs RN and CNA that pt needs chair alarm. Will continue to follow acutely. Continue to recommend STR in SNF setting for safety and fall prevention with standing self care tasks as well as dynamic sitting.   Follow Up Recommendations  SNF    Equipment Recommendations  Other (comment) (defer)    Recommendations for Other Services      Precautions / Restrictions Precautions Precautions: Fall Precaution Comments: monitor HR and sats Restrictions Other Position/Activity Restrictions: per secure chat with Ortho, pt is to use platform walker for ambulation (s/p I&D to R wrist), but no formal  WB restriction order.       Mobility Bed Mobility Overal bed mobility: Needs Assistance Bed Mobility: Supine to Sit     Supine to sit: Min assist;HOB elevated     General bed mobility comments: increased time, HOB elevated, assist to bring LEs toward EOB, pt also has to manually assist LEs toward EOB with use of UEs.    Transfers Overall transfer level: Needs assistance Equipment used: 1 person hand held assist Transfers: Sit to/from [facility] Sit to Stand: Min assist;From elevated surface Stand pivot transfers: Min assist;From elevated surface       General transfer comment: cues for safety    Balance Overall balance assessment: Needs assistance Sitting-balance support: Feet supported Sitting balance-Leahy Scale: Good     Standing balance support: Single extremity supported;During functional activity Standing balance-Leahy Scale: Poor                             ADL either performed or assessed with clinical judgement   ADL Overall ADL's : Needs assistance/impaired     Grooming: Set up;Sitting;Cueing for sequencing               Lower Body Dressing: Moderate assistance;Maximal assistance;Sitting/lateral leans Lower Body Dressing Details (indicate cue type and reason): to pull socks up prior to mobilizing, pt attempts, but ROM is limited and pain to R UE is limiting  Vision Patient Visual Report: No change from baseline     Perception     Praxis      Cognition Arousal/Alertness: Awake/alert Behavior During Therapy: WFL for tasks assessed/performed Overall Cognitive Status: No family/caregiver present to determine baseline cognitive functioning Area of Impairment: Orientation;Following commands;Safety/judgement;Awareness;Problem solving;Memory                 Orientation Level: Disoriented to;Situation   Memory: Decreased short-term memory Following Commands: Follows one step commands  consistently   Awareness: Intellectual Problem Solving: Requires verbal cues;Requires tactile cues          Exercises Other Exercises Other Exercises: OT educates pt re: importance of sitting upright for lungs and drinking fluids to help liquefy secretions and attmpting to cough and expel phlegm for PNA prevention. In addition, tied in how mobilizing helps contribute to the elimination of phlegm.   Shoulder Instructions       General Comments      Pertinent Vitals/ Pain       Pain Assessment: Faces Faces Pain Scale: Hurts a little bit Pain Location: R UE Pain Descriptors / Indicators: Discomfort Pain Intervention(s): Monitored during session (pt reports feeling much better after I&D)  Home Living                                          Prior Functioning/Environment              Frequency  Min 1X/week        Progress Toward Goals  OT Goals(current goals can now be found in the care plan section)  Progress towards OT goals: Progressing toward goals  Acute Rehab OT Goals Patient Stated Goal: get stronger OT Goal Formulation: With patient Time For Goal Achievement: 06/19/20  Plan Discharge plan remains appropriate    Co-evaluation                 AM-PAC OT "6 Clicks" Daily Activity     Outcome Measure   Help from another person eating meals?: A Little Help from another person taking care of personal grooming?: A Little Help from another person toileting, which includes using toliet, bedpan, or urinal?: A Lot Help from another person bathing (including washing, rinsing, drying)?: A Lot Help from another person to put on and taking off regular upper body clothing?: A Little Help from another person to put on and taking off regular lower body clothing?: A Lot 6 Click Score: 15    End of Session Equipment Utilized During Treatment: Gait belt;Rolling walker (R platform)  OT Visit Diagnosis: Unsteadiness on feet (R26.81);Muscle  weakness (generalized) (M62.81);Other symptoms and signs involving cognitive function   Activity Tolerance Patient tolerated treatment well   Patient Left with call bell/phone within reach;in chair   Nurse Communication Mobility status (notified pt up to chair, this author called out for chair alarm, but did not recieve one during session. Pt left with phone and call button and OT ed re: safety precations/waiting for help)        Time: 6578-4696 OT Time Calculation (min): 23 min  Charges: OT General Charges $OT Visit: 1 Visit OT Treatments $Self Care/Home Management : 8-22 mins $Therapeutic Activity: 8-22 mins  Gerrianne Scale, [place], OTR/L ascom [phone] 06/10/20, 6:08 PM

## 2020-06-10 NOTE — Plan of Care (Signed)

## 2020-06-10 NOTE — Progress Notes (Signed)
PROGRESS NOTE    Gregory Small  LPF:[id number] DOB: 1920/01/06 DOA: 06/04/2020 PCP: [facility]    CC; right hand swelling Brief Narrative:  Gregory Dimes Schneideris a 87 y.o.malewith medical history significant ofhypertension, GERD, hypothyroidism, vertigo, atrial fibrillation on anticoagulants, chronic pain syndrome, CKD stage IIIa,myasthenia gravis after receiving 2 doses of COVID vaccine,who presents with a fall, pain in right hip and right knee, right arm pain and redness. Upon arriving the hospital, he was Covid positive. Right hand was swelling and tender. He was diagnosed with cellulitis of right hand, start vancomycin and Rocephin. Blood culture came back with MSSA, antibiotic switched to cefazolin. Surgical I &D performed 4/11.   Assessment & Plan:   Principal Problem:   Cellulitis of right upper arm Active Problems:   Myasthenia gravis (HCC)   Atrial fibrillation, chronic (HCC)   Hypothyroidism   Chronic pain syndrome   Fall   CKD (chronic kidney disease), stage IIIa   HTN (hypertension)   GERD (gastroesophageal reflux disease)   COVID-19 virus infection   MSSA (methicillin susceptible Staphylococcus aureus) septicemia (HCC)  #1. Sepsis with MSSA septicemia secondary to right arm cellulitis Right arm cellulitis with small right hand abscess due to MSSA. Status post I&D 4/11. Patient blood culture was positive for MRSA, repeat culture was negative. Echocardiogram did not show any valvular changes.  Patient is currently being treated with cefazolin.  He received 2 doses of linezolid when he lost IV.  PICC line placed. Plan: --cont cefazolin for a total of 4 weeks (stop date 5/7) --follow up with ID outpatient clinic in 3 weeks --Ortho Will change dressing in the office next week if discharged, appointment scheduled (Dr. Odis Luster)  Acute kidney injury, improved --encourage oral hydration  2.  Myasthenia gravis pain Stable  3.  Covid  infection. Still asymptomatic.  4.  Chronic atrial fibrillation.   Not chronically on anticoagulation, no tachycardia.  Hypothyroidism --cont synthroid  HTN --home lasix held   DVT prophylaxis: Lovenox Code Status: DNR Family Communication:  Disposition Plan:   Status is: Inpatient   Dispo: The patient is from: ALF              Anticipated d/c is to: SNF whenever bed available               Patient currently is medically stable to d/c.   Difficult to place patient No   I/O last 3 completed shifts: In: 648.6 [P.O.:360; IV Piggyback:288.6] Out: 300 [Urine:300] No intake/output data recorded.     Consultants:   ID, orthopedics  Procedures: I &D  Antimicrobials: Cefazolin.  Subjective: Right arm pain was better today, and swelling also improved.     Objective: Vitals:   06/10/20 0908 06/10/20 1154 06/10/20 1608 06/10/20 1700  BP: (!) 141/88 125/66 (!) 177/99 (!) 160/70  Pulse: 63 (!) 53 67   Resp: 18 20 18    Temp: 98.4 F (36.9 C) 97.6 F (36.4 C) 98 F (36.7 C)   TempSrc:      SpO2: 98% 99% 100%   Weight:      Height:        Intake/Output Summary (Last 24 hours) at 06/10/2020 1831 Last data filed at 06/10/2020 0200 Gross per 24 hour  Intake --  Output 200 ml  Net -200 ml   Filed Weights   06/04/20 0924  Weight: 84.9 kg    Examination:  Constitutional: NAD, alert, oriented to person and place HEENT: conjunctivae and lids normal, EOMI CV:  No cyanosis.   RESP: normal respiratory effort, on RA Extremities: 2+ pitting edema in BLE SKIN: warm, dry.  ACE wrap over right forearm.   Neuro: II - XII grossly intact.   Psych: Normal mood and affect.     Data Reviewed: I have personally reviewed following labs and imaging studies  CBC: Recent Labs  Lab 06/04/20 0939 06/05/20 0524 06/09/20 0426 06/10/20 0447  WBC 15.8* 13.8* 15.7* 14.0*  NEUTROABS 12.9*  --  13.0*  --   HGB 12.4* 11.2* 11.4* 11.5*  HCT 39.5 35.3* 35.5* 35.7*  MCV 97.1  97.2 96.2 96.0  PLT 186 169 279 292   Basic Metabolic Panel: Recent Labs  Lab 06/05/20 0524 06/06/20 0455 06/07/20 0440 06/09/20 0426 06/10/20 0447  NA 138 139 139 137 138  K 3.6 3.9 3.9 4.4 4.5  CL 104 105 103 100 101  CO2 26 27 28 28 27   GLUCOSE 92 120* 115* 129* 124*  BUN 23 25* 24* 31* 30*  CREATININE 1.03 0.89 0.92 1.23 1.09  CALCIUM 8.5* 8.4* 8.4* 8.7* 8.4*  MG  --   --   --  2.1 2.3   GFR: Estimated Creatinine Clearance: 39.4 mL/min (by C-G formula based on SCr of 1.09 mg/dL). Liver Function Tests: Recent Labs  Lab 06/04/20 0939  AST 48*  ALT 21  ALKPHOS 55  BILITOT 1.9*  PROT 6.8  ALBUMIN 3.4*   No results for input(s): LIPASE, AMYLASE in the last 168 hours. No results for input(s): AMMONIA in the last 168 hours. Coagulation Profile: No results for input(s): INR, PROTIME in the last 168 hours. Cardiac Enzymes: No results for input(s): CKTOTAL, CKMB, CKMBINDEX, TROPONINI in the last 168 hours. BNP (last 3 results) No results for input(s): PROBNP in the last 8760 hours. HbA1C: No results for input(s): HGBA1C in the last 72 hours. CBG: No results for input(s): GLUCAP in the last 168 hours. Lipid Profile: No results for input(s): CHOL, HDL, LDLCALC, TRIG, CHOLHDL, LDLDIRECT in the last 72 hours. Thyroid Function Tests: No results for input(s): TSH, T4TOTAL, FREET4, T3FREE, THYROIDAB in the last 72 hours. Anemia Panel: No results for input(s): VITAMINB12, FOLATE, FERRITIN, TIBC, IRON, RETICCTPCT in the last 72 hours. Sepsis Labs: Recent Labs  Lab 06/04/20 1129 06/04/20 1140  PROCALCITON  --  1.12  LATICACIDVEN 1.5  --     Recent Results (from the past 240 hour(s))  Culture, blood (routine x 2)     Status: Abnormal   Collection Time: 06/04/20 11:40 AM   Specimen: BLOOD RIGHT ARM  Result Value Ref Range Status   Specimen Description   Final    BLOOD RIGHT ARM Performed at [facility], [address], [place], [place]     Special Requests   Final    BAA Blood Culture adequate volume Performed at [facility], [address] [place], [place]    Culture  Setup Time   Final    GRAM POSITIVE COCCI ANAEROBIC BOTTLE ONLY Organism ID to follow CRITICAL RESULT CALLED TO, READ BACK BY AND VERIFIED WITH: [place]ARMD [place] [address] ON 06/05/20 BY GM Performed at [facility], [address] [place], [place]    Culture STAPHYLOCOCCUS AUREUS (A)  Final   Report Status 06/07/2020 FINAL  Final   Organism ID, Bacteria STAPHYLOCOCCUS AUREUS  Final      Susceptibility   Staphylococcus aureus - MIC*    CIPROFLOXACIN <=0.5 SENSITIVE Sensitive     ERYTHROMYCIN <=0.25  SENSITIVE Sensitive     GENTAMICIN <=0.5 SENSITIVE Sensitive     OXACILLIN 0.5 SENSITIVE Sensitive     TETRACYCLINE <=1 SENSITIVE Sensitive     VANCOMYCIN <=0.5 SENSITIVE Sensitive     TRIMETH/SULFA <=10 SENSITIVE Sensitive     CLINDAMYCIN <=0.25 SENSITIVE Sensitive     RIFAMPIN <=0.5 SENSITIVE Sensitive     Inducible Clindamycin NEGATIVE Sensitive     * STAPHYLOCOCCUS AUREUS  Blood Culture ID Panel (Reflexed)     Status: Abnormal   Collection Time: 06/04/20 11:40 AM  Result Value Ref Range Status   Enterococcus faecalis NOT DETECTED NOT DETECTED Final   Enterococcus Faecium NOT DETECTED NOT DETECTED Final   Listeria monocytogenes NOT DETECTED NOT DETECTED Final   Staphylococcus species DETECTED (A) NOT DETECTED Final    Comment: CRITICAL RESULT CALLED TO, READ BACK BY AND VERIFIED WITH: A.CHAPPEL,PHARMD AT 0917 ON 06/05/20 BY GM    Staphylococcus aureus (BCID) DETECTED (A) NOT DETECTED Final    Comment: CRITICAL RESULT CALLED TO, READ BACK BY AND VERIFIED WITH: A.CHAPPEL,PHARMD AT 0917 ON 06/05/20 BY GM    Staphylococcus epidermidis NOT DETECTED NOT DETECTED Final   Staphylococcus lugdunensis NOT DETECTED NOT DETECTED Final   Streptococcus species NOT DETECTED NOT DETECTED Final   Streptococcus agalactiae NOT  DETECTED NOT DETECTED Final   Streptococcus pneumoniae NOT DETECTED NOT DETECTED Final   Streptococcus pyogenes NOT DETECTED NOT DETECTED Final   A.calcoaceticus-baumannii NOT DETECTED NOT DETECTED Final   Bacteroides fragilis NOT DETECTED NOT DETECTED Final   Enterobacterales NOT DETECTED NOT DETECTED Final   Enterobacter cloacae complex NOT DETECTED NOT DETECTED Final   Escherichia coli NOT DETECTED NOT DETECTED Final   Klebsiella aerogenes NOT DETECTED NOT DETECTED Final   Klebsiella oxytoca NOT DETECTED NOT DETECTED Final   Klebsiella pneumoniae NOT DETECTED NOT DETECTED Final   Proteus species NOT DETECTED NOT DETECTED Final   Salmonella species NOT DETECTED NOT DETECTED Final   Serratia marcescens NOT DETECTED NOT DETECTED Final   Haemophilus influenzae NOT DETECTED NOT DETECTED Final   Neisseria meningitidis NOT DETECTED NOT DETECTED Final   Pseudomonas aeruginosa NOT DETECTED NOT DETECTED Final   Stenotrophomonas maltophilia NOT DETECTED NOT DETECTED Final   Candida albicans NOT DETECTED NOT DETECTED Final   Candida auris NOT DETECTED NOT DETECTED Final   Candida glabrata NOT DETECTED NOT DETECTED Final   Candida krusei NOT DETECTED NOT DETECTED Final   Candida parapsilosis NOT DETECTED NOT DETECTED Final   Candida tropicalis NOT DETECTED NOT DETECTED Final   Cryptococcus neoformans/gattii NOT DETECTED NOT DETECTED Final   Meth resistant mecA/C and MREJ NOT DETECTED NOT DETECTED Final    Comment: Performed at [facility], [address] [place], [place] [address]  SARS CORONAVIRUS 2 (TAT 6-24 HRS) Nasopharyngeal Nasopharyngeal Swab     Status: Abnormal   Collection Time: 06/04/20 11:48 AM   Specimen: Nasopharyngeal Swab  Result Value Ref Range Status   SARS Coronavirus 2 POSITIVE (A) NEGATIVE Final    Comment: (NOTE) SARS-CoV-2 target nucleic acids are DETECTED.  The SARS-CoV-2 RNA is generally detectable in upper and lower respiratory specimens during the  acute phase of infection. Positive results are indicative of the presence of SARS-CoV-2 RNA. Clinical correlation with patient history and other diagnostic information is  necessary to determine patient infection status. Positive results do not rule out bacterial infection or co-infection with other viruses.  The expected result is Negative.  Fact Sheet for Patients: HairSlick.no  Fact Sheet for Healthcare Providers:  quierodirigir.com  This test is not yet approved or cleared by the [place] and  has been authorized for detection and/or diagnosis of SARS-CoV-2 by FDA under an Emergency Use Authorization (EUA). This EUA will remain  in effect (meaning this test can be used) for the duration of the COVID-19 declaration under Section 564(b)(1) of the Act, 21 U. S.C. section 360bbb-3(b)(1), unless the authorization is terminated or revoked sooner.   Performed at [facility], [address], [place], [place] [address]   Culture, blood (Routine X 2) w Reflex to ID Panel     Status: None   Collection Time: 06/05/20 10:55 AM   Specimen: BLOOD LEFT HAND  Result Value Ref Range Status   Specimen Description BLOOD LEFT HAND  Final   Special Requests   Final    BOTTLES DRAWN AEROBIC AND ANAEROBIC Blood Culture adequate volume   Culture   Final    NO GROWTH 5 DAYS Performed at [facility], [address], [place], [place] [address]    Report Status 06/10/2020 FINAL  Final  Culture, blood (Routine X 2) w Reflex to ID Panel     Status: None   Collection Time: 06/05/20 11:51 AM   Specimen: BLOOD  Result Value Ref Range Status   Specimen Description BLOOD BLOOD LEFT HAND  Final   Special Requests   Final    BOTTLES DRAWN AEROBIC AND ANAEROBIC Blood Culture adequate volume   Culture   Final    NO GROWTH 5 DAYS Performed at [facility], [address] [place], [place] [address]    Report Status  06/10/2020 FINAL  Final  Aerobic/Anaerobic Culture w Gram Stain (surgical/deep wound)     Status: None (Preliminary result)   Collection Time: 06/06/20  5:22 PM   Specimen: Hand; Abscess  Result Value Ref Range Status   Specimen Description   Final    HAND Performed at [facility], [address], [place], [place] [address]    Special Requests   Final    NONE Performed at [facility], [address] [place], [place] [address]    Gram Stain   Final    FEW WBC PRESENT, PREDOMINANTLY PMN NO ORGANISMS SEEN Performed at [facility], [address] [place], [place] [address]    Culture   Final    MODERATE STAPHYLOCOCCUS AUREUS NO ANAEROBES ISOLATED; CULTURE IN PROGRESS FOR 5 DAYS    Report Status PENDING  Incomplete   Organism ID, Bacteria STAPHYLOCOCCUS AUREUS  Final      Susceptibility   Staphylococcus aureus - MIC*    CIPROFLOXACIN <=0.5 SENSITIVE Sensitive     ERYTHROMYCIN <=0.25 SENSITIVE Sensitive     GENTAMICIN <=0.5 SENSITIVE Sensitive     OXACILLIN 0.5 SENSITIVE Sensitive     TETRACYCLINE <=1 SENSITIVE Sensitive     VANCOMYCIN 1 SENSITIVE Sensitive     TRIMETH/SULFA <=10 SENSITIVE Sensitive     CLINDAMYCIN <=0.25 SENSITIVE Sensitive     RIFAMPIN <=0.5 SENSITIVE Sensitive     Inducible Clindamycin NEGATIVE Sensitive     * MODERATE STAPHYLOCOCCUS AUREUS  Aerobic/Anaerobic Culture w Gram Stain (surgical/deep wound)     Status: None (Preliminary result)   Collection Time: 06/07/20  5:04 PM   Specimen: PATH Other; Wound  Result Value Ref Range Status   Specimen Description   Final    FLUID RIGHT WRIST Performed at [facility], [address] [place], [place] [address]  Special Requests   Final    NONE Performed at [facility], [address] [place], [place] [address]    Gram Stain   Final    NO WBC SEEN RARE GRAM POSITIVE COCCI Performed at [facility], [address] [place], [place] [address]     Culture   Final    FEW STAPHYLOCOCCUS AUREUS NO ANAEROBES ISOLATED; CULTURE IN PROGRESS FOR 5 DAYS    Report Status PENDING  Incomplete   Organism ID, Bacteria STAPHYLOCOCCUS AUREUS  Final      Susceptibility   Staphylococcus aureus - MIC*    CIPROFLOXACIN <=0.5 SENSITIVE Sensitive     ERYTHROMYCIN <=0.25 SENSITIVE Sensitive     GENTAMICIN <=0.5 SENSITIVE Sensitive     OXACILLIN 0.5 SENSITIVE Sensitive     TETRACYCLINE <=1 SENSITIVE Sensitive     VANCOMYCIN <=0.5 SENSITIVE Sensitive     TRIMETH/SULFA <=10 SENSITIVE Sensitive     CLINDAMYCIN <=0.25 SENSITIVE Sensitive     RIFAMPIN <=0.5 SENSITIVE Sensitive     Inducible Clindamycin NEGATIVE Sensitive     * FEW STAPHYLOCOCCUS AUREUS  Aerobic/Anaerobic Culture w Gram Stain (surgical/deep wound)     Status: None (Preliminary result)   Collection Time: 06/07/20  5:04 PM   Specimen: PATH Other; Wound  Result Value Ref Range Status   Specimen Description WOUND RIGHT HAND  Final   Special Requests NONE  Final   Gram Stain   Final    RARE WBC PRESENT,BOTH PMN AND MONONUCLEAR NO ORGANISMS SEEN Performed at [facility], [address] [place], [place] [address]    Culture   Final    FEW STAPHYLOCOCCUS AUREUS NO ANAEROBES ISOLATED; CULTURE IN PROGRESS FOR 5 DAYS    Report Status PENDING  Incomplete   Organism ID, Bacteria STAPHYLOCOCCUS AUREUS  Final      Susceptibility   Staphylococcus aureus - MIC*    CIPROFLOXACIN <=0.5 SENSITIVE Sensitive     ERYTHROMYCIN <=0.25 SENSITIVE Sensitive     GENTAMICIN <=0.5 SENSITIVE Sensitive     OXACILLIN 0.5 SENSITIVE Sensitive     TETRACYCLINE <=1 SENSITIVE Sensitive     VANCOMYCIN 1 SENSITIVE Sensitive     TRIMETH/SULFA <=10 SENSITIVE Sensitive     CLINDAMYCIN <=0.25 SENSITIVE Sensitive     RIFAMPIN <=0.5 SENSITIVE Sensitive     Inducible Clindamycin NEGATIVE Sensitive     * FEW STAPHYLOCOCCUS AUREUS         Radiology Studies: No results found.      Scheduled Meds: .  brimonidine  1 drop Both Eyes BID  . calcium carbonate  600 mg of elemental calcium Oral Q breakfast  . calcium-vitamin D  1 tablet Oral Daily  . Chlorhexidine Gluconate Cloth  6 each Topical Daily  . docusate sodium  100 mg Oral BID  . enoxaparin (LOVENOX) injection  40 mg Subcutaneous Q24H  . levothyroxine  88 mcg Oral QAC breakfast  . pantoprazole  40 mg Oral Daily  . predniSONE  50 mg Oral Q12H  . pyridostigmine  60 mg Oral TID  . sodium chloride flush  10-40 mL Intracatheter Q12H  . timolol  1 drop Both Eyes BID   Continuous Infusions: .  ceFAZolin (ANCEF) IV 2 g (06/10/20 1358)     LOS: 6 days     Darlin Priestlyina Felicia Bloomquist, MD [facility]   To contact the attending provider between 7A-7P or the covering provider during after hours 7P-7A, please log into the web site [email or web address] and access  using universal [place] password for that web site. If you do not have the password, please call the hospital operator.  06/10/2020, 6:31 PM

## 2020-06-10 NOTE — TOC Progression Note (Addendum)
Transition of Care [facility]) - Progression Note    Patient Details  Name: Gregory Small MRN: [id number] Date of Birth: April 22, 1919  Transition of Care [facility]) CM/SW Contact  Chapman Fitch, RN Phone Number: 06/10/2020, 4:56 PM  Clinical Narrative:     Confirmed with Melissa at [facility] that she has started auth [id number]  If patient receives auth patient can come tomorrow.  If auth not received would have to be Monday.  MD and daughter notified   Expected Discharge Plan: Skilled Nursing Facility Barriers to Discharge: Continued Medical Work up  Expected Discharge Plan and Services Expected Discharge Plan: Skilled Nursing Facility       Living arrangements for the past 2 months: Assisted Living Facility                                       Social Determinants of Health (SDOH) Interventions    Readmission Risk Interventions Readmission Risk Prevention Plan 06/06/2020  Transportation Screening Complete  PCP or Specialist Appt within 3-5 Days Complete  HRI or Home Care Consult Complete  Social Work Consult for Recovery Care Planning/Counseling Complete  Palliative Care Screening Not Applicable  Medication Review Oceanographer) Complete  Some recent data might be hidden

## 2020-06-11 DIAGNOSIS — L03113 Cellulitis of right upper limb: Secondary | ICD-10-CM | POA: Diagnosis not present

## 2020-06-11 LAB — AEROBIC/ANAEROBIC CULTURE W GRAM STAIN (SURGICAL/DEEP WOUND)

## 2020-06-11 LAB — BASIC METABOLIC PANEL
Anion gap: 8 (ref 5–15)
BUN: 28 mg/dL — ABNORMAL HIGH (ref 8–23)
CO2: 28 mmol/L (ref 22–32)
Calcium: 8.3 mg/dL — ABNORMAL LOW (ref 8.9–10.3)
Chloride: 101 mmol/L (ref 98–111)
Creatinine, Ser: 1.04 mg/dL (ref 0.61–1.24)
GFR, Estimated: >60 mL/min (ref >60)
Glucose, Bld: 113 mg/dL — ABNORMAL HIGH (ref 70–99)
Potassium: 4.3 mmol/L (ref 3.5–5.1)
Sodium: 137 mmol/L (ref 135–145)

## 2020-06-11 LAB — CBC
HCT: 34.3 % — ABNORMAL LOW (ref 39.0–52.0)
Hemoglobin: 11 g/dL — ABNORMAL LOW (ref 13.0–17.0)
MCH: 30.8 pg (ref 26.0–34.0)
MCHC: 32.1 g/dL (ref 30.0–36.0)
MCV: 96.1 fL (ref 80.0–100.0)
Platelets: 294 10*3/uL (ref 150–400)
RBC: 3.57 MIL/uL — ABNORMAL LOW (ref 4.22–5.81)
RDW: 15.6 % — ABNORMAL HIGH (ref 11.5–15.5)
WBC: 13 10*3/uL — ABNORMAL HIGH (ref 4.0–10.5)
nRBC: 0 % (ref 0.0–0.2)

## 2020-06-11 LAB — MAGNESIUM: Magnesium: 2.3 mg/dL (ref 1.7–2.4)

## 2020-06-11 MED ORDER — SODIUM CHLORIDE 0.9 % IV SOLN
INTRAVENOUS | Status: DC | PRN
  Administered 2020-06-11 – 2020-06-12 (×2): 1000 mL via INTRAVENOUS

## 2020-06-11 NOTE — Progress Notes (Signed)
  Subjective:  Patient reports pain as mild.    Objective:   VITALS:   Vitals:   06/10/20 1608 06/10/20 1700 06/10/20 1950 06/11/20 0533  BP: (!) 177/99 (!) 160/70 (!) 152/76 (!) 146/62  Pulse: 67  71 (!) 55  Resp: 18  18 18   Temp: 98 F (36.7 C)  97.7 F (36.5 C) (!) 97.5 F (36.4 C)  TempSrc:      SpO2: 100%  98% 98%  Weight:      Height:        PHYSICAL EXAM:  Neurologically intact Neurovascular intact Sensation intact distally Intact pulses distally Incision: scant drainage and drsg changed today.  splint intact. Compartment soft  No evidence of worsening cellulitis  LABS  Results for orders placed or performed during the hospital encounter of 06/04/20 (from the past 24 hour(s))  Basic metabolic panel     Status: Abnormal   Collection Time: 06/11/20  4:47 AM  Result Value Ref Range   Sodium 137 135 - 145 mmol/L   Potassium 4.3 3.5 - 5.1 mmol/L   Chloride 101 98 - 111 mmol/L   CO2 28 22 - 32 mmol/L   Glucose, Bld 113 (H) 70 - 99 mg/dL   BUN 28 (H) 8 - 23 mg/dL   Creatinine, Ser 06/13/20 0.61 - 1.24 mg/dL   Calcium 8.3 (L) 8.9 - 10.3 mg/dL   GFR, Estimated 7.10 >62 mL/min   Anion gap 8 5 - 15  CBC     Status: Abnormal   Collection Time: 06/11/20  4:47 AM  Result Value Ref Range   WBC 13.0 (H) 4.0 - 10.5 K/uL   RBC 3.57 (L) 4.22 - 5.81 MIL/uL   Hemoglobin 11.0 (L) 13.0 - 17.0 g/dL   HCT 06/13/20 (L) 48.5 - 46.2 %   MCV 96.1 80.0 - 100.0 fL   MCH 30.8 26.0 - 34.0 pg   MCHC 32.1 30.0 - 36.0 g/dL   RDW 70.3 (H) 50.0 - 93.8 %   Platelets 294 150 - 400 K/uL   nRBC 0.0 0.0 - 0.2 %  Magnesium     Status: None   Collection Time: 06/11/20  4:47 AM  Result Value Ref Range   Magnesium 2.3 1.7 - 2.4 mg/dL    No results found.  Assessment/Plan: 4 Days Post-Op   Principal Problem:   Cellulitis of right upper arm Active Problems:   Myasthenia gravis (HCC)   Atrial fibrillation, chronic (HCC)   Hypothyroidism   Chronic pain syndrome   Fall   CKD (chronic kidney  disease), stage IIIa   HTN (hypertension)   GERD (gastroesophageal reflux disease)   COVID-19 virus infection   MSSA (methicillin susceptible Staphylococcus aureus) septicemia (HCC)   POD#4 right wrist and hand irrigation and debridement Follow cultures Dressing changed today, follow up next week in office, appointment scheduled Call to confirm [phone] Continue to elevate hand Continue antibiotics per ID   [phone] , PA-C 06/11/2020, 7:46 AM

## 2020-06-11 NOTE — Progress Notes (Signed)
PT Cancellation Note  Patient Details Name: Gregory Small MRN: [id number] DOB: 04-03-1919   Cancelled Treatment:     PT attempt. Pt just started eating lunch. RN in room. Will return at a later time/date when pt is available to participate.     Rushie Chestnut 06/11/2020, 1:30 PM

## 2020-06-11 NOTE — Consult Note (Signed)
Pharmacy Antibiotic Note  Gregory Small is a 87 y.o. male admitted on 06/04/2020 with cellulitis. Patient initiated on broad spectrum coverage with vancomycin and ceftriaxone. Blood cultures have now resulted GPC in 1/4 bottles to date. BCID detected Staphylococcus aureus. No resistance genes detected. Pharmacy has been consulted for cefazolin dosing. ID has been consulted.   MSSA In hand and wrist cultures.  Repeat Bcx NG  Plan: - continue Cefazolin 2 g IV q8h - follow renal function, SCr stable - See OPAT, cefazolin until 07/03/2020  Continue to monitor renal function and adjust antibiotics as indicated. Continue to follow-up cultures.  Height: 5\' 7"  (170.2 cm) Weight: 84.9 kg (187 lb 1.6 oz) IBW/kg (Calculated) : 66.1  Temp (24hrs), Avg:97.6 F (36.4 C), Min:97.4 F (36.3 C), Max:98 F (36.7 C)  Recent Labs  Lab 06/05/20 0524 06/06/20 0455 06/07/20 0440 06/09/20 0426 06/10/20 0447 06/11/20 0447  WBC 13.8*  --   --  15.7* 14.0* 13.0*  CREATININE 1.03 0.89 0.92 1.23 1.09 1.04    Estimated Creatinine Clearance: 41.3 mL/min (by C-G formula based on SCr of 1.04 mg/dL).    Allergies  Allergen Reactions  . Simvastatin Other (See Comments)    Myalgias   . Sulfa Antibiotics     Antimicrobials this admission: Cefepime 4/8 x 1 Vancomycin 4/8 x 1 Ceftriaxone 4/9 x 1 Cefazolin 4/9 >>  Dose adjustments this admission: n/a  Microbiology results: 4/8 BCx: MSSA 4/11 hand and wrist cx: MSSA 4/8 SARS-CoV-2: (+)  Thank you for allowing pharmacy to be a part of this patient's care.  6/8, PharmD, BCPS.   Work Cell: [phone] 06/11/2020 11:36 AM

## 2020-06-11 NOTE — TOC Progression Note (Addendum)
Transition of Care [facility]) - Progression Note    Patient Details  Name: Gregory Small MRN: [id number] Date of Birth: 23-Mar-1919  Transition of Care [facility]) CM/SW Contact  Liliana Cline, LCSW Phone Number: 06/11/2020, 8:46 AM  Clinical Narrative:   CSW left voicemail for Efraim Kaufmann at [facility] [phone]  requesting a call today if they obtain authorization. Per notes, if insurance Berkley Harvey is not obtained today- will have to wait until Monday.  12:40- Called and left Melissa at [facility] another VM requesting an update on insurance authorization status.  2:25- Call from [facility] who reported they just got auth but cannot take patient "this late in the day." Earliest she can take patient is Monday. They want him there around 11 am if possible, I told her EMS is scheduled day of. Fax DC Summary, COVID results, and orders to [phone] Monday.  Expected Discharge Plan: Skilled Nursing Facility Barriers to Discharge: Continued Medical Work up  Expected Discharge Plan and Services Expected Discharge Plan: Skilled Nursing Facility       Living arrangements for the past 2 months: Assisted Living Facility                                       Social Determinants of Health (SDOH) Interventions    Readmission Risk Interventions Readmission Risk Prevention Plan 06/06/2020  Transportation Screening Complete  PCP or Specialist Appt within 3-5 Days Complete  HRI or Home Care Consult Complete  Social Work Consult for Recovery Care Planning/Counseling Complete  Palliative Care Screening Not Applicable  Medication Review Oceanographer) Complete  Some recent data might be hidden

## 2020-06-11 NOTE — Progress Notes (Signed)
PROGRESS NOTE    Bernette MayersRichard J Stockert  ZOX:[id number] DOB: 05/13/1919 DOA: 06/04/2020 PCP: [facility]    CC; right hand swelling Brief Narrative:  Gregory Dimesichard J Schneideris a 87 y.o.malewith medical history significant ofhypertension, GERD, hypothyroidism, vertigo, atrial fibrillation on anticoagulants, chronic pain syndrome, CKD stage IIIa,myasthenia gravis after receiving 2 doses of COVID vaccine,who presents with a fall, pain in right hip and right knee, right arm pain and redness. Upon arriving the hospital, he was Covid positive. Right hand was swelling and tender. He was diagnosed with cellulitis of right hand, start vancomycin and Rocephin. Blood culture came back with MSSA, antibiotic switched to cefazolin. Surgical I &D performed 4/11.   Assessment & Plan:   Principal Problem:   Cellulitis of right upper arm Active Problems:   Myasthenia gravis (HCC)   Atrial fibrillation, chronic (HCC)   Hypothyroidism   Chronic pain syndrome   Fall   CKD (chronic kidney disease), stage IIIa   HTN (hypertension)   GERD (gastroesophageal reflux disease)   COVID-19 virus infection   MSSA (methicillin susceptible Staphylococcus aureus) septicemia (HCC)  #1. Sepsis with MSSA septicemia secondary to right arm cellulitis Right arm cellulitis with small right hand abscess due to MSSA. Status post I&D 4/11. Patient blood culture was positive for MRSA, repeat culture was negative. Echocardiogram did not show any valvular changes.  Patient is currently being treated with cefazolin.  He received 2 doses of linezolid when he lost IV.  PICC line placed. Plan: --cont cefazolin for a total of 4 weeks (stop date 5/7) --follow up with ID outpatient clinic in 3 weeks --follow up in the ortho office next week if discharged, appointment scheduled (Dr. Odis LusterBowers)  Acute kidney injury, improved --encourage oral hydration  2.  Myasthenia gravis pain Stable  3.  Covid infection. Still  asymptomatic.  4.  Chronic atrial fibrillation.   Not chronically on anticoagulation, no tachycardia.  Hypothyroidism --cont synthroid  HTN --home lasix held   DVT prophylaxis: Lovenox Code Status: DNR Family Communication: daughter updated on the phone today Disposition Plan:   Status is: Inpatient   Dispo: The patient is from: ALF              Anticipated d/c is to: SNF whenever bed available, on Monday              Patient currently is medically stable to d/c.   Difficult to place patient No   I/O last 3 completed shifts: In: -  Out: 300 [Urine:300] No intake/output data recorded.     Consultants:   ID, orthopedics  Procedures: I &D  Antimicrobials: Cefazolin.  Subjective: Right arm dressing changed by ortho today.  Insurance auth came through late in the day, SNF won't accept pt until Monday.   Objective: Vitals:   06/10/20 1950 06/11/20 0533 06/11/20 0858 06/11/20 1305  BP: (!) 152/76 (!) 146/62 (!) 147/71 (!) 157/97  Pulse: 71 (!) 55 (!) 58 (!) 52  Resp: 18 18 20 20   Temp: 97.7 F (36.5 C) (!) 97.5 F (36.4 C) (!) 97.4 F (36.3 C) 97.6 F (36.4 C)  TempSrc:      SpO2: 98% 98% 100% 97%  Weight:      Height:        Intake/Output Summary (Last 24 hours) at 06/11/2020 1529 Last data filed at 06/11/2020 0534 Gross per 24 hour  Intake --  Output 100 ml  Net -100 ml   Filed Weights   06/04/20 0924  Weight: 84.9  kg    Examination:  Constitutional: NAD, alert, oriented to person and place CV: No cyanosis.   RESP: normal respiratory effort, on RA Extremities: 2+ edema in BLE   Data Reviewed: I have personally reviewed following labs and imaging studies  CBC: Recent Labs  Lab 06/05/20 0524 06/09/20 0426 06/10/20 0447 06/11/20 0447  WBC 13.8* 15.7* 14.0* 13.0*  NEUTROABS  --  13.0*  --   --   HGB 11.2* 11.4* 11.5* 11.0*  HCT 35.3* 35.5* 35.7* 34.3*  MCV 97.2 96.2 96.0 96.1  PLT 169 279 292 294   Basic Metabolic Panel: Recent  Labs  Lab 06/06/20 0455 06/07/20 0440 06/09/20 0426 06/10/20 0447 06/11/20 0447  NA 139 139 137 138 137  K 3.9 3.9 4.4 4.5 4.3  CL 105 103 100 101 101  CO2 27 28 28 27 28   GLUCOSE 120* 115* 129* 124* 113*  BUN 25* 24* 31* 30* 28*  CREATININE 0.89 0.92 1.23 1.09 1.04  CALCIUM 8.4* 8.4* 8.7* 8.4* 8.3*  MG  --   --  2.1 2.3 2.3   GFR: Estimated Creatinine Clearance: 41.3 mL/min (by C-G formula based on SCr of 1.04 mg/dL). Liver Function Tests: No results for input(s): AST, ALT, ALKPHOS, BILITOT, PROT, ALBUMIN in the last 168 hours. No results for input(s): LIPASE, AMYLASE in the last 168 hours. No results for input(s): AMMONIA in the last 168 hours. Coagulation Profile: No results for input(s): INR, PROTIME in the last 168 hours. Cardiac Enzymes: No results for input(s): CKTOTAL, CKMB, CKMBINDEX, TROPONINI in the last 168 hours. BNP (last 3 results) No results for input(s): PROBNP in the last 8760 hours. HbA1C: No results for input(s): HGBA1C in the last 72 hours. CBG: No results for input(s): GLUCAP in the last 168 hours. Lipid Profile: No results for input(s): CHOL, HDL, LDLCALC, TRIG, CHOLHDL, LDLDIRECT in the last 72 hours. Thyroid Function Tests: No results for input(s): TSH, T4TOTAL, FREET4, T3FREE, THYROIDAB in the last 72 hours. Anemia Panel: No results for input(s): VITAMINB12, FOLATE, FERRITIN, TIBC, IRON, RETICCTPCT in the last 72 hours. Sepsis Labs: No results for input(s): PROCALCITON, LATICACIDVEN in the last 168 hours.  Recent Results (from the past 240 hour(s))  Culture, blood (routine x 2)     Status: Abnormal   Collection Time: 06/04/20 11:40 AM   Specimen: BLOOD RIGHT ARM  Result Value Ref Range Status   Specimen Description   Final    BLOOD RIGHT ARM Performed at [facility], [address] [place], [place]    Special Requests   Final    BAA Blood Culture adequate volume Performed at [facility], [address] [place], [place]    Culture  Setup Time   Final    GRAM POSITIVE COCCI ANAEROBIC BOTTLE ONLY Organism ID to follow CRITICAL RESULT CALLED TO, READ BACK BY AND VERIFIED WITH: [place]ARMD [place] [address] ON 06/05/20 BY GM Performed at [facility], [address] [place], [place]    Culture STAPHYLOCOCCUS AUREUS (A)  Final   Report Status 06/07/2020 FINAL  Final   Organism ID, Bacteria STAPHYLOCOCCUS AUREUS  Final      Susceptibility   Staphylococcus aureus - MIC*    CIPROFLOXACIN <=0.5 SENSITIVE Sensitive     ERYTHROMYCIN <=0.25 SENSITIVE Sensitive     GENTAMICIN <=0.5 SENSITIVE Sensitive     OXACILLIN 0.5 SENSITIVE Sensitive     TETRACYCLINE <=1 SENSITIVE Sensitive     VANCOMYCIN <=0.5 SENSITIVE Sensitive  TRIMETH/SULFA <=10 SENSITIVE Sensitive     CLINDAMYCIN <=0.25 SENSITIVE Sensitive     RIFAMPIN <=0.5 SENSITIVE Sensitive     Inducible Clindamycin NEGATIVE Sensitive     * STAPHYLOCOCCUS AUREUS  Blood Culture ID Panel (Reflexed)     Status: Abnormal   Collection Time: 06/04/20 11:40 AM  Result Value Ref Range Status   Enterococcus faecalis NOT DETECTED NOT DETECTED Final   Enterococcus Faecium NOT DETECTED NOT DETECTED Final   Listeria monocytogenes NOT DETECTED NOT DETECTED Final   Staphylococcus species DETECTED (A) NOT DETECTED Final    Comment: CRITICAL RESULT CALLED TO, READ BACK BY AND VERIFIED WITH: A.CHAPPEL,PHARMD AT 0917 ON 06/05/20 BY GM    Staphylococcus aureus (BCID) DETECTED (A) NOT DETECTED Final    Comment: CRITICAL RESULT CALLED TO, READ BACK BY AND VERIFIED WITH: A.CHAPPEL,PHARMD AT 0917 ON 06/05/20 BY GM    Staphylococcus epidermidis NOT DETECTED NOT DETECTED Final   Staphylococcus lugdunensis NOT DETECTED NOT DETECTED Final   Streptococcus species NOT DETECTED NOT DETECTED Final   Streptococcus agalactiae NOT DETECTED NOT DETECTED Final   Streptococcus pneumoniae NOT DETECTED NOT DETECTED Final   Streptococcus pyogenes NOT  DETECTED NOT DETECTED Final   A.calcoaceticus-baumannii NOT DETECTED NOT DETECTED Final   Bacteroides fragilis NOT DETECTED NOT DETECTED Final   Enterobacterales NOT DETECTED NOT DETECTED Final   Enterobacter cloacae complex NOT DETECTED NOT DETECTED Final   Escherichia coli NOT DETECTED NOT DETECTED Final   Klebsiella aerogenes NOT DETECTED NOT DETECTED Final   Klebsiella oxytoca NOT DETECTED NOT DETECTED Final   Klebsiella pneumoniae NOT DETECTED NOT DETECTED Final   Proteus species NOT DETECTED NOT DETECTED Final   Salmonella species NOT DETECTED NOT DETECTED Final   Serratia marcescens NOT DETECTED NOT DETECTED Final   Haemophilus influenzae NOT DETECTED NOT DETECTED Final   Neisseria meningitidis NOT DETECTED NOT DETECTED Final   Pseudomonas aeruginosa NOT DETECTED NOT DETECTED Final   Stenotrophomonas maltophilia NOT DETECTED NOT DETECTED Final   Candida albicans NOT DETECTED NOT DETECTED Final   Candida auris NOT DETECTED NOT DETECTED Final   Candida glabrata NOT DETECTED NOT DETECTED Final   Candida krusei NOT DETECTED NOT DETECTED Final   Candida parapsilosis NOT DETECTED NOT DETECTED Final   Candida tropicalis NOT DETECTED NOT DETECTED Final   Cryptococcus neoformans/gattii NOT DETECTED NOT DETECTED Final   Meth resistant mecA/C and MREJ NOT DETECTED NOT DETECTED Final    Comment: Performed at [facility], [address] [place], [place] [address]  SARS CORONAVIRUS 2 (TAT 6-24 HRS) Nasopharyngeal Nasopharyngeal Swab     Status: Abnormal   Collection Time: 06/04/20 11:48 AM   Specimen: Nasopharyngeal Swab  Result Value Ref Range Status   SARS Coronavirus 2 POSITIVE (A) NEGATIVE Final    Comment: (NOTE) SARS-CoV-2 target nucleic acids are DETECTED.  The SARS-CoV-2 RNA is generally detectable in upper and lower respiratory specimens during the acute phase of infection. Positive results are indicative of the presence of SARS-CoV-2 RNA. Clinical correlation with  patient history and other diagnostic information is  necessary to determine patient infection status. Positive results do not rule out bacterial infection or co-infection with other viruses.  The expected result is Negative.  Fact Sheet for Patients: HairSlick.no  Fact Sheet for Healthcare Providers: quierodirigir.com  This test is not yet approved or cleared by the [place] FDA and  has been authorized for detection and/or diagnosis of SARS-CoV-2 by FDA under an Emergency Use Authorization (EUA). This EUA will remain  in effect (meaning this test can be used) for the duration of the COVID-19 declaration under Section 564(b)(1) of the Act, 21 U. S.C. section 360bbb-3(b)(1), unless the authorization is terminated or revoked sooner.   Performed at [facility], [address], [place], [place] [address]   Culture, blood (Routine X 2) w Reflex to ID Panel     Status: None   Collection Time: 06/05/20 10:55 AM   Specimen: BLOOD LEFT HAND  Result Value Ref Range Status   Specimen Description BLOOD LEFT HAND  Final   Special Requests   Final    BOTTLES DRAWN AEROBIC AND ANAEROBIC Blood Culture adequate volume   Culture   Final    NO GROWTH 5 DAYS Performed at [facility], [address] [place], [place] [address]    Report Status 06/10/2020 FINAL  Final  Culture, blood (Routine X 2) w Reflex to ID Panel     Status: None   Collection Time: 06/05/20 11:51 AM   Specimen: BLOOD  Result Value Ref Range Status   Specimen Description BLOOD BLOOD LEFT HAND  Final   Special Requests   Final    BOTTLES DRAWN AEROBIC AND ANAEROBIC Blood Culture adequate volume   Culture   Final    NO GROWTH 5 DAYS Performed at [facility], [address], [place], [place] [address]    Report Status 06/10/2020 FINAL  Final  Aerobic/Anaerobic Culture w Gram Stain (surgical/deep wound)     Status: None   Collection Time:  06/06/20  5:22 PM   Specimen: Hand; Abscess  Result Value Ref Range Status   Specimen Description   Final    HAND Performed at [facility], [address] [place], [place] [address]    Special Requests   Final    NONE Performed at [facility], [address] [place], [place] [address]    Gram Stain   Final    FEW WBC PRESENT, PREDOMINANTLY PMN NO ORGANISMS SEEN    Culture   Final    MODERATE STAPHYLOCOCCUS AUREUS NO ANAEROBES ISOLATED Performed at [facility], [address] [place], [place] [address]    Report Status 06/11/2020 FINAL  Final   Organism ID, Bacteria STAPHYLOCOCCUS AUREUS  Final      Susceptibility   Staphylococcus aureus - MIC*    CIPROFLOXACIN <=0.5 SENSITIVE Sensitive     ERYTHROMYCIN <=0.25 SENSITIVE Sensitive     GENTAMICIN <=0.5 SENSITIVE Sensitive     OXACILLIN 0.5 SENSITIVE Sensitive     TETRACYCLINE <=1 SENSITIVE Sensitive     VANCOMYCIN 1 SENSITIVE Sensitive     TRIMETH/SULFA <=10 SENSITIVE Sensitive     CLINDAMYCIN <=0.25 SENSITIVE Sensitive     RIFAMPIN <=0.5 SENSITIVE Sensitive     Inducible Clindamycin NEGATIVE Sensitive     * MODERATE STAPHYLOCOCCUS AUREUS  Aerobic/Anaerobic Culture w Gram Stain (surgical/deep wound)     Status: None (Preliminary result)   Collection Time: 06/07/20  5:04 PM   Specimen: PATH Other; Wound  Result Value Ref Range Status   Specimen Description   Final    FLUID RIGHT WRIST Performed at [facility], [address] [place], [place] [address]    Special Requests   Final    NONE Performed at [facility], [address] [place], [place] [address]    Gram Stain   Final    NO WBC SEEN RARE GRAM POSITIVE COCCI Performed at [facility], [address] [place], [place] [address]    Culture   Final    FEW STAPHYLOCOCCUS AUREUS NO ANAEROBES ISOLATED; CULTURE IN PROGRESS FOR 5 DAYS    Report Status PENDING  Incomplete   Organism ID, Bacteria STAPHYLOCOCCUS  AUREUS  Final      Susceptibility   Staphylococcus aureus - MIC*    CIPROFLOXACIN <=0.5 SENSITIVE Sensitive     ERYTHROMYCIN <=0.25 SENSITIVE Sensitive     GENTAMICIN <=0.5 SENSITIVE Sensitive     OXACILLIN 0.5 SENSITIVE Sensitive     TETRACYCLINE <=1 SENSITIVE Sensitive     VANCOMYCIN <=0.5 SENSITIVE Sensitive     TRIMETH/SULFA <=10 SENSITIVE Sensitive     CLINDAMYCIN <=0.25 SENSITIVE Sensitive     RIFAMPIN <=0.5 SENSITIVE Sensitive     Inducible Clindamycin NEGATIVE Sensitive     * FEW STAPHYLOCOCCUS AUREUS  Aerobic/Anaerobic Culture w Gram Stain (surgical/deep wound)     Status: None (Preliminary result)   Collection Time: 06/07/20  5:04 PM   Specimen: PATH Other; Wound  Result Value Ref Range Status   Specimen Description WOUND RIGHT HAND  Final   Special Requests NONE  Final   Gram Stain   Final    RARE WBC PRESENT,BOTH PMN AND MONONUCLEAR NO ORGANISMS SEEN Performed at [facility], [address], [place], [place] [address]    Culture   Final    FEW STAPHYLOCOCCUS AUREUS NO ANAEROBES ISOLATED; CULTURE IN PROGRESS FOR 5 DAYS    Report Status PENDING  Incomplete   Organism ID, Bacteria STAPHYLOCOCCUS AUREUS  Final      Susceptibility   Staphylococcus aureus - MIC*    CIPROFLOXACIN <=0.5 SENSITIVE Sensitive     ERYTHROMYCIN <=0.25 SENSITIVE Sensitive     GENTAMICIN <=0.5 SENSITIVE Sensitive     OXACILLIN 0.5 SENSITIVE Sensitive     TETRACYCLINE <=1 SENSITIVE Sensitive     VANCOMYCIN 1 SENSITIVE Sensitive     TRIMETH/SULFA <=10 SENSITIVE Sensitive     CLINDAMYCIN <=0.25 SENSITIVE Sensitive     RIFAMPIN <=0.5 SENSITIVE Sensitive     Inducible Clindamycin NEGATIVE Sensitive     * FEW STAPHYLOCOCCUS AUREUS         Radiology Studies: No results found.      Scheduled Meds: . brimonidine  1 drop Both Eyes BID  . calcium carbonate  600 mg of elemental calcium Oral Q breakfast  . calcium-vitamin D  1 tablet Oral Daily  . Chlorhexidine Gluconate Cloth  6  each Topical Daily  . docusate sodium  100 mg Oral BID  . enoxaparin (LOVENOX) injection  40 mg Subcutaneous Q24H  . levothyroxine  88 mcg Oral QAC breakfast  . pantoprazole  40 mg Oral Daily  . predniSONE  50 mg Oral Q12H  . pyridostigmine  60 mg Oral TID  . sodium chloride flush  10-40 mL Intracatheter Q12H  . timolol  1 drop Both Eyes BID   Continuous Infusions: .  ceFAZolin (ANCEF) IV 2 g (06/11/20 1329)     LOS: 7 days     Darlin Priestly, MD [facility]   To contact the attending provider between 7A-7P or the covering provider during after hours 7P-7A, please log into the web site [email or web address] and access using universal [place] password for that web site. If you do not have the password, please call the hospital operator.  06/11/2020, 3:29 PM

## 2020-06-11 NOTE — Care Management Important Message (Signed)
Important Message  Patient Details  Name: Gregory Small MRN: [id number] Date of Birth: 1919/11/09   Medicare Important Message Given:  Yes  Reviewed Medicare IM with patient via room phone due to isolation status.     Johnell Comings 06/11/2020, 12:51 PM

## 2020-06-12 DIAGNOSIS — L03113 Cellulitis of right upper limb: Secondary | ICD-10-CM | POA: Diagnosis not present

## 2020-06-12 LAB — CBC
HCT: 35.7 % — ABNORMAL LOW (ref 39.0–52.0)
Hemoglobin: 11.6 g/dL — ABNORMAL LOW (ref 13.0–17.0)
MCH: 30.9 pg (ref 26.0–34.0)
MCHC: 32.5 g/dL (ref 30.0–36.0)
MCV: 94.9 fL (ref 80.0–100.0)
Platelets: 324 10*3/uL (ref 150–400)
RBC: 3.76 MIL/uL — ABNORMAL LOW (ref 4.22–5.81)
RDW: 15.7 % — ABNORMAL HIGH (ref 11.5–15.5)
WBC: 13.2 10*3/uL — ABNORMAL HIGH (ref 4.0–10.5)
nRBC: 0 % (ref 0.0–0.2)

## 2020-06-12 LAB — BASIC METABOLIC PANEL
Anion gap: 8 (ref 5–15)
BUN: 28 mg/dL — ABNORMAL HIGH (ref 8–23)
CO2: 30 mmol/L (ref 22–32)
Calcium: 8.3 mg/dL — ABNORMAL LOW (ref 8.9–10.3)
Chloride: 98 mmol/L (ref 98–111)
Creatinine, Ser: 0.96 mg/dL (ref 0.61–1.24)
GFR, Estimated: >60 mL/min (ref >60)
Glucose, Bld: 115 mg/dL — ABNORMAL HIGH (ref 70–99)
Potassium: 3.9 mmol/L (ref 3.5–5.1)
Sodium: 136 mmol/L (ref 135–145)

## 2020-06-12 LAB — MAGNESIUM: Magnesium: 2.4 mg/dL (ref 1.7–2.4)

## 2020-06-12 MED ORDER — PREDNISONE 20 MG PO TABS
20.0000 mg | ORAL_TABLET | Freq: Every day | ORAL | Status: DC
  Administered 2020-06-13 – 2020-06-14 (×2): 20 mg via ORAL
  Filled 2020-06-12 (×2): qty 1

## 2020-06-12 MED ORDER — HYDRALAZINE HCL 50 MG PO TABS
50.0000 mg | ORAL_TABLET | Freq: Three times a day (TID) | ORAL | Status: DC
  Administered 2020-06-12 – 2020-06-13 (×3): 50 mg via ORAL
  Filled 2020-06-12 (×3): qty 1

## 2020-06-12 MED ORDER — FUROSEMIDE 10 MG/ML IJ SOLN
40.0000 mg | Freq: Once | INTRAMUSCULAR | Status: AC
  Administered 2020-06-12: 40 mg via INTRAVENOUS
  Filled 2020-06-12: qty 4

## 2020-06-12 MED ORDER — FUROSEMIDE 20 MG PO TABS
20.0000 mg | ORAL_TABLET | Freq: Every day | ORAL | Status: DC
  Filled 2020-06-12: qty 1

## 2020-06-12 MED ORDER — FUROSEMIDE 20 MG PO TABS: 20.0000 mg | ORAL_TABLET | Freq: Every day | ORAL | Status: DC

## 2020-06-12 NOTE — Plan of Care (Signed)

## 2020-06-12 NOTE — Progress Notes (Signed)
PROGRESS NOTE    Gregory Small  ZOX:[id number] DOB: [birth year] DOA: 06/04/2020 PCP: [facility]    CC; right hand swelling Brief Narrative:  Gregory Small a 87 y.o.malewith medical history significant ofhypertension, GERD, hypothyroidism, vertigo, atrial fibrillation on anticoagulants, chronic pain syndrome, CKD stage IIIa,myasthenia gravis after receiving 2 doses of COVID vaccine,who presents with a fall, pain in right hip and right knee, right arm pain and redness. Upon arriving the hospital, he was Covid positive. Right hand was swelling and tender. He was diagnosed with cellulitis of right hand, start vancomycin and Rocephin. Blood culture came back with MSSA, antibiotic switched to cefazolin. Surgical I &D performed 4/11.   Assessment & Plan:   Principal Problem:   Cellulitis of right upper arm Active Problems:   Myasthenia gravis (HCC)   Atrial fibrillation, chronic (HCC)   Hypothyroidism   Chronic pain syndrome   Fall   CKD (chronic kidney disease), stage IIIa   HTN (hypertension)   GERD (gastroesophageal reflux disease)   COVID-19 virus infection   MSSA (methicillin susceptible Staphylococcus aureus) septicemia (HCC)  #1. Sepsis with MSSA septicemia secondary to right arm cellulitis Right arm cellulitis with small right hand abscess due to MSSA. Status post I&D 4/11. Patient blood culture was positive for MRSA, repeat culture was negative. Echocardiogram did not show any valvular changes.  Patient is currently being treated with cefazolin.  He received 2 doses of linezolid when he lost IV.  PICC line placed. --Home prednisone was reportedly increased to 50 mg BID for arm swelling Plan: --cont cefazolin for a total of 4 weeks (stop date 5/7) --follow up with ID outpatient clinic in 3 weeks --follow up in the ortho office next week if discharged, appointment scheduled (Dr. Odis Luster)  Acute kidney injury, improved --encourage oral  hydration  Myasthenia gravis pain --resume home prednisone 20 mg daily  Covid infection. Still asymptomatic.  Chronic atrial fibrillation.   Not chronically on anticoagulation, no tachycardia.  Hypothyroidism --cont synthroid  HTN --home lasix held.  BP has been elevated. --IV lasix 40 mg x1 today --start hydralazine  q8h  Chronic BLE edema from venous stasis --IV Lasix 40 mg x1 today --apply ACE wrap to both legs   DVT prophylaxis: Lovenox Code Status: DNR Family Communication: daughter updated at the bedside today Disposition Plan:   Status is: Inpatient   Dispo: The patient is from: ALF              Anticipated d/c is to: SNF whenever bed available, likely on Monday              Patient currently is medically stable to d/c.   Difficult to place patient No   I/O last 3 completed shifts: In: 733.8 [I.V.:15.5; IV Piggyback:718.4] Out: 400 [Urine:400] Total I/O In: 222.8 [I.V.:29.4; IV Piggyback:193.4] Out: 750 [Urine:750]     Consultants:   ID, orthopedics  Procedures: I &D  Antimicrobials: Cefazolin.  Subjective: Pt is tired of being in the hospital, but still pleasant and cooperative.     Objective: Vitals:   06/11/20 2314 06/12/20 0327 06/12/20 0917 06/12/20 1428  BP: (!) 143/68 (!) 165/94 (!) 170/88 (!) 171/90  Pulse: (!) 52 (!) 57 62 63  Resp: Temp: 97.9 F (36.6 C) 97.8 F (36.6 C) (!) 97.5 F (36.4 C) 97.7 F (36.5 C)  TempSrc: Oral Oral Oral Oral  SpO2: 95% 100% 99% 100%  Weight:      Height:  Intake/Output Summary (Last 24 hours) at 06/12/2020 1755 Last data filed at 06/12/2020 1647 Gross per 24 hour  Intake 956.6 ml  Output 1050 ml  Net -93.4 ml   Filed Weights   06/04/20 0924  Weight: 84.9 kg    Examination:  Constitutional: NAD, alert, oriented to person and place HEENT: conjunctivae and lids normal, EOMI CV: No cyanosis.   RESP: normal respiratory effort, on RA Extremities: 2+ pitting edema  in BLE SKIN: warm, dry Neuro: II - XII grossly intact.   Psych: Normal mood and affect.  Appropriate judgement and reason   Data Reviewed: I have personally reviewed following labs and imaging studies  CBC: Recent Labs  Lab 06/09/20 0426 06/10/20 0447 06/11/20 0447 06/12/20 0539  WBC 15.7* 14.0* 13.0* 13.2*  NEUTROABS 13.0*  --   --   --   HGB 11.4* 11.5* 11.0* 11.6*  HCT 35.5* 35.7* 34.3* 35.7*  MCV 96.2 96.0 96.1 94.9  PLT 279 292 294 324   Basic Metabolic Panel: Recent Labs  Lab 06/07/20 0440 06/09/20 0426 06/10/20 0447 06/11/20 0447 06/12/20 0539  NA 139 137 138 137 136  K 3.9 4.4 4.5 4.3 3.9  CL 103 100 101 101 98  CO2 GLUCOSE 115* 129* 124* 113* 115*  BUN 24* 31* 30* 28* 28*  CREATININE 0.92 1.23 1.09 1.04 0.96  CALCIUM 8.4* 8.7* 8.4* 8.3* 8.3*  MG  --  2.1 2.3 2.3 2.4   GFR: Estimated Creatinine Clearance: 44.7 mL/min (by C-G formula based on SCr of 0.96 mg/dL). Liver Function Tests: No results for input(s): AST, ALT, ALKPHOS, BILITOT, PROT, ALBUMIN in the last 168 hours. No results for input(s): LIPASE, AMYLASE in the last 168 hours. No results for input(s): AMMONIA in the last 168 hours. Coagulation Profile: No results for input(s): INR, PROTIME in the last 168 hours. Cardiac Enzymes: No results for input(s): CKTOTAL, CKMB, CKMBINDEX, TROPONINI in the last 168 hours. BNP (last 3 results) No results for input(s): PROBNP in the last 8760 hours. HbA1C: No results for input(s): HGBA1C in the last 72 hours. CBG: No results for input(s): GLUCAP in the last 168 hours. Lipid Profile: No results for input(s): CHOL, HDL, LDLCALC, TRIG, CHOLHDL, LDLDIRECT in the last 72 hours. Thyroid Function Tests: No results for input(s): TSH, T4TOTAL, FREET4, T3FREE, THYROIDAB in the last 72 hours. Anemia Panel: No results for input(s): VITAMINB12, FOLATE, FERRITIN, TIBC, IRON, RETICCTPCT in the last 72 hours. Sepsis Labs: No results for input(s):  PROCALCITON, LATICACIDVEN in the last 168 hours.  Recent Results (from the past 240 hour(s))  Culture, blood (routine x 2)     Status: Abnormal   Collection Time: 06/04/20 11:40 AM   Specimen: BLOOD RIGHT ARM  Result Value Ref Range Status   Specimen Description   Final    BLOOD RIGHT ARM Performed at [facility], [address], [place], [place] [address]    Special Requests   Final    BAA Blood Culture adequate volume Performed at [facility], [address] [place], [place] [address]    Culture  Setup Time   Final    GRAM POSITIVE COCCI ANAEROBIC BOTTLE ONLY Organism ID to follow CRITICAL RESULT CALLED TO, READ BACK BY AND VERIFIED WITH: A.CHAPPEL,PHARMD AT 0981 ON 06/05/20 BY GM Performed at [facility], [address] [place], [place] [address]    Culture STAPHYLOCOCCUS AUREUS (A)  Final   Report Status 06/07/2020 FINAL  Final  Organism ID, Bacteria STAPHYLOCOCCUS AUREUS  Final      Susceptibility   Staphylococcus aureus - MIC*    CIPROFLOXACIN <=0.5 SENSITIVE Sensitive     ERYTHROMYCIN <=0.25 SENSITIVE Sensitive     GENTAMICIN <=0.5 SENSITIVE Sensitive     OXACILLIN 0.5 SENSITIVE Sensitive     TETRACYCLINE <=1 SENSITIVE Sensitive     VANCOMYCIN <=0.5 SENSITIVE Sensitive     TRIMETH/SULFA <=10 SENSITIVE Sensitive     CLINDAMYCIN <=0.25 SENSITIVE Sensitive     RIFAMPIN <=0.5 SENSITIVE Sensitive     Inducible Clindamycin NEGATIVE Sensitive     * STAPHYLOCOCCUS AUREUS  Blood Culture ID Panel (Reflexed)     Status: Abnormal   Collection Time: 06/04/20 11:40 AM  Result Value Ref Range Status   Enterococcus faecalis NOT DETECTED NOT DETECTED Final   Enterococcus Faecium NOT DETECTED NOT DETECTED Final   Listeria monocytogenes NOT DETECTED NOT DETECTED Final   Staphylococcus species DETECTED (A) NOT DETECTED Final    Comment: CRITICAL RESULT CALLED TO, READ BACK BY AND VERIFIED WITH: A.CHAPPEL,PHARMD AT 0917 ON 06/05/20 BY GM     Staphylococcus aureus (BCID) DETECTED (A) NOT DETECTED Final    Comment: CRITICAL RESULT CALLED TO, READ BACK BY AND VERIFIED WITH: A.CHAPPEL,PHARMD AT 0917 ON 06/05/20 BY GM    Staphylococcus epidermidis NOT DETECTED NOT DETECTED Final   Staphylococcus lugdunensis NOT DETECTED NOT DETECTED Final   Streptococcus species NOT DETECTED NOT DETECTED Final   Streptococcus agalactiae NOT DETECTED NOT DETECTED Final   Streptococcus pneumoniae NOT DETECTED NOT DETECTED Final   Streptococcus pyogenes NOT DETECTED NOT DETECTED Final   A.calcoaceticus-baumannii NOT DETECTED NOT DETECTED Final   Bacteroides fragilis NOT DETECTED NOT DETECTED Final   Enterobacterales NOT DETECTED NOT DETECTED Final   Enterobacter cloacae complex NOT DETECTED NOT DETECTED Final   Escherichia coli NOT DETECTED NOT DETECTED Final   Klebsiella aerogenes NOT DETECTED NOT DETECTED Final   Klebsiella oxytoca NOT DETECTED NOT DETECTED Final   Klebsiella pneumoniae NOT DETECTED NOT DETECTED Final   Proteus species NOT DETECTED NOT DETECTED Final   Salmonella species NOT DETECTED NOT DETECTED Final   Serratia marcescens NOT DETECTED NOT DETECTED Final   Haemophilus influenzae NOT DETECTED NOT DETECTED Final   Neisseria meningitidis NOT DETECTED NOT DETECTED Final   Pseudomonas aeruginosa NOT DETECTED NOT DETECTED Final   Stenotrophomonas maltophilia NOT DETECTED NOT DETECTED Final   Candida albicans NOT DETECTED NOT DETECTED Final   Candida auris NOT DETECTED NOT DETECTED Final   Candida glabrata NOT DETECTED NOT DETECTED Final   Candida krusei NOT DETECTED NOT DETECTED Final   Candida parapsilosis NOT DETECTED NOT DETECTED Final   Candida tropicalis NOT DETECTED NOT DETECTED Final   Cryptococcus neoformans/gattii NOT DETECTED NOT DETECTED Final   Meth resistant mecA/C and MREJ NOT DETECTED NOT DETECTED Final    Comment: Performed at [facility], [address] [place], [place] [address]  SARS CORONAVIRUS 2 (TAT  6-24 HRS) Nasopharyngeal Nasopharyngeal Swab     Status: Abnormal   Collection Time: 06/04/20 11:48 AM   Specimen: Nasopharyngeal Swab  Result Value Ref Range Status   SARS Coronavirus 2 POSITIVE (A) NEGATIVE Final    Comment: (NOTE) SARS-CoV-2 target nucleic acids are DETECTED.  The SARS-CoV-2 RNA is generally detectable in upper and lower respiratory specimens during the acute phase of infection. Positive results are indicative of the presence of SARS-CoV-2 RNA. Clinical correlation with patient history and other diagnostic information is  necessary to determine patient infection  status. Positive results do not rule out bacterial infection or co-infection with other viruses.  The expected result is Negative.  Fact Sheet for Patients: HairSlick.no  Fact Sheet for Healthcare Providers: quierodirigir.com  This test is not yet approved or cleared by the [place] FDA and  has been authorized for detection and/or diagnosis of SARS-CoV-2 by FDA under an Emergency Use Authorization (EUA). This EUA will remain  in effect (meaning this test can be used) for the duration of the COVID-19 declaration under Section 564(b)(1) of the Act, 21 U. S.C. section 360bbb-3(b)(1), unless the authorization is terminated or revoked sooner.   Performed at [facility], [address], [place], [place] [address]   Culture, blood (Routine X 2) w Reflex to ID Panel     Status: None   Collection Time: 06/05/20 10:55 AM   Specimen: BLOOD LEFT HAND  Result Value Ref Range Status   Specimen Description BLOOD LEFT HAND  Final   Special Requests   Final    BOTTLES DRAWN AEROBIC AND ANAEROBIC Blood Culture adequate volume   Culture   Final    NO GROWTH 5 DAYS Performed at [facility], [address], [place], [place] [address]    Report Status 06/10/2020 FINAL  Final  Culture, blood (Routine X 2) w Reflex to ID Panel     Status:  None   Collection Time: 06/05/20 11:51 AM   Specimen: BLOOD  Result Value Ref Range Status   Specimen Description BLOOD BLOOD LEFT HAND  Final   Special Requests   Final    BOTTLES DRAWN AEROBIC AND ANAEROBIC Blood Culture adequate volume   Culture   Final    NO GROWTH 5 DAYS Performed at [facility], [address] [place], [place] [address]    Report Status 06/10/2020 FINAL  Final  Aerobic/Anaerobic Culture w Gram Stain (surgical/deep wound)     Status: None   Collection Time: 06/06/20  5:22 PM   Specimen: Hand; Abscess  Result Value Ref Range Status   Specimen Description   Final    HAND Performed at [facility], [address], [place], [place] [address]    Special Requests   Final    NONE Performed at [facility], [address] [place], [place] [address]    Gram Stain   Final    FEW WBC PRESENT, PREDOMINANTLY PMN NO ORGANISMS SEEN    Culture   Final    MODERATE STAPHYLOCOCCUS AUREUS NO ANAEROBES ISOLATED Performed at [facility], [address] [place], [place] [address]    Report Status 06/11/2020 FINAL  Final   Organism ID, Bacteria STAPHYLOCOCCUS AUREUS  Final      Susceptibility   Staphylococcus aureus - MIC*    CIPROFLOXACIN <=0.5 SENSITIVE Sensitive     ERYTHROMYCIN <=0.25 SENSITIVE Sensitive     GENTAMICIN <=0.5 SENSITIVE Sensitive     OXACILLIN 0.5 SENSITIVE Sensitive     TETRACYCLINE <=1 SENSITIVE Sensitive     VANCOMYCIN 1 SENSITIVE Sensitive     TRIMETH/SULFA <=10 SENSITIVE Sensitive     CLINDAMYCIN <=0.25 SENSITIVE Sensitive     RIFAMPIN <=0.5 SENSITIVE Sensitive     Inducible Clindamycin NEGATIVE Sensitive     * MODERATE STAPHYLOCOCCUS AUREUS  Aerobic/Anaerobic Culture w Gram Stain (surgical/deep wound)     Status: None (Preliminary result)   Collection Time: 06/07/20  5:04 PM   Specimen: PATH Other; Wound  Result Value Ref Range Status  Specimen Description   Final    FLUID RIGHT WRIST Performed at  [facility], [address] [place], [place] [address]    Special Requests   Final    NONE Performed at [facility], [address] [place], [place] [address]    Gram Stain   Final    NO WBC SEEN RARE GRAM POSITIVE COCCI Performed at [facility], [address] [place], [place] [address]    Culture   Final    FEW STAPHYLOCOCCUS AUREUS NO ANAEROBES ISOLATED; CULTURE IN PROGRESS FOR 5 DAYS    Report Status PENDING  Incomplete   Organism ID, Bacteria STAPHYLOCOCCUS AUREUS  Final      Susceptibility   Staphylococcus aureus - MIC*    CIPROFLOXACIN <=0.5 SENSITIVE Sensitive     ERYTHROMYCIN <=0.25 SENSITIVE Sensitive     GENTAMICIN <=0.5 SENSITIVE Sensitive     OXACILLIN 0.5 SENSITIVE Sensitive     TETRACYCLINE <=1 SENSITIVE Sensitive     VANCOMYCIN <=0.5 SENSITIVE Sensitive     TRIMETH/SULFA <=10 SENSITIVE Sensitive     CLINDAMYCIN <=0.25 SENSITIVE Sensitive     RIFAMPIN <=0.5 SENSITIVE Sensitive     Inducible Clindamycin NEGATIVE Sensitive     * FEW STAPHYLOCOCCUS AUREUS  Aerobic/Anaerobic Culture w Gram Stain (surgical/deep wound)     Status: None (Preliminary result)   Collection Time: 06/07/20  5:04 PM   Specimen: PATH Other; Wound  Result Value Ref Range Status   Specimen Description WOUND RIGHT HAND  Final   Special Requests NONE  Final   Gram Stain   Final    RARE WBC PRESENT,BOTH PMN AND MONONUCLEAR NO ORGANISMS SEEN Performed at [facility], [address] [place], [place] [address]    Culture   Final    FEW STAPHYLOCOCCUS AUREUS NO ANAEROBES ISOLATED; CULTURE IN PROGRESS FOR 5 DAYS    Report Status PENDING  Incomplete   Organism ID, Bacteria STAPHYLOCOCCUS AUREUS  Final      Susceptibility   Staphylococcus aureus - MIC*    CIPROFLOXACIN <=0.5 SENSITIVE Sensitive     ERYTHROMYCIN <=0.25 SENSITIVE Sensitive     GENTAMICIN <=0.5 SENSITIVE Sensitive     OXACILLIN 0.5 SENSITIVE Sensitive     TETRACYCLINE <=1 SENSITIVE Sensitive      VANCOMYCIN 1 SENSITIVE Sensitive     TRIMETH/SULFA <=10 SENSITIVE Sensitive     CLINDAMYCIN <=0.25 SENSITIVE Sensitive     RIFAMPIN <=0.5 SENSITIVE Sensitive     Inducible Clindamycin NEGATIVE Sensitive     * FEW STAPHYLOCOCCUS AUREUS         Radiology Studies: No results found.      Scheduled Meds: . brimonidine  1 drop Both Eyes BID  . calcium carbonate  600 mg of elemental calcium Oral Q breakfast  . calcium-vitamin D  1 tablet Oral Daily  . Chlorhexidine Gluconate Cloth  6 each Topical Daily  . docusate sodium  100 mg Oral BID  . enoxaparin (LOVENOX) injection  40 mg Subcutaneous Q24H  . [START ON 06/13/2020] furosemide  20 mg Oral Daily  . levothyroxine  88 mcg Oral QAC breakfast  . pantoprazole  40 mg Oral Daily  . [START ON 06/13/2020] predniSONE  20 mg Oral Q breakfast  . pyridostigmine  60 mg Oral TID  . sodium chloride flush  10-40 mL Intracatheter Q12H  . timolol  1 drop Both Eyes BID   Continuous Infusions: . sodium chloride Stopped (06/12/20 1330)  .  ceFAZolin (ANCEF) IV Stopped (06/12/20 1401)     LOS: 8 days     Darlin Priestly, MD [facility]   To contact the attending provider between 7A-7P or the covering provider during after hours 7P-7A, please log into the web site [email or web address] and access using universal [place] password for that web site. If you do not have the password, please call the hospital operator.  06/12/2020, 5:55 PM

## 2020-06-13 DIAGNOSIS — L03113 Cellulitis of right upper limb: Secondary | ICD-10-CM | POA: Diagnosis not present

## 2020-06-13 LAB — AEROBIC/ANAEROBIC CULTURE W GRAM STAIN (SURGICAL/DEEP WOUND): Gram Stain: NONE SEEN

## 2020-06-13 LAB — CBC
HCT: 40.5 % (ref 39.0–52.0)
Hemoglobin: 13.5 g/dL (ref 13.0–17.0)
MCH: 31 pg (ref 26.0–34.0)
MCHC: 33.3 g/dL (ref 30.0–36.0)
MCV: 93.1 fL (ref 80.0–100.0)
Platelets: 260 10*3/uL (ref 150–400)
RBC: 4.35 MIL/uL (ref 4.22–5.81)
RDW: 15.7 % — ABNORMAL HIGH (ref 11.5–15.5)
WBC: 15 10*3/uL — ABNORMAL HIGH (ref 4.0–10.5)
nRBC: 0.2 % (ref 0.0–0.2)

## 2020-06-13 LAB — BASIC METABOLIC PANEL
Anion gap: 12 (ref 5–15)
BUN: 27 mg/dL — ABNORMAL HIGH (ref 8–23)
CO2: 29 mmol/L (ref 22–32)
Calcium: 8.4 mg/dL — ABNORMAL LOW (ref 8.9–10.3)
Chloride: 96 mmol/L — ABNORMAL LOW (ref 98–111)
Creatinine, Ser: 1.07 mg/dL (ref 0.61–1.24)
GFR, Estimated: >60 mL/min (ref >60)
Glucose, Bld: 76 mg/dL (ref 70–99)
Potassium: 3.8 mmol/L (ref 3.5–5.1)
Sodium: 137 mmol/L (ref 135–145)

## 2020-06-13 LAB — MAGNESIUM: Magnesium: 2.1 mg/dL (ref 1.7–2.4)

## 2020-06-13 MED ORDER — ADULT MULTIVITAMIN W/MINERALS CH
1.0000 | ORAL_TABLET | Freq: Every day | ORAL | Status: DC
  Administered 2020-06-13 – 2020-06-14 (×2): 1 via ORAL
  Filled 2020-06-13 (×2): qty 1

## 2020-06-13 MED ORDER — ENSURE ENLIVE PO LIQD
237.0000 mL | Freq: Every day | ORAL | Status: DC
  Administered 2020-06-13: 237 mL via ORAL

## 2020-06-13 MED ORDER — ACETAMINOPHEN 500 MG PO TABS
1000.0000 mg | ORAL_TABLET | Freq: Three times a day (TID) | ORAL | Status: DC | PRN
  Administered 2020-06-14: 1000 mg via ORAL
  Filled 2020-06-13: qty 2

## 2020-06-13 MED ORDER — JUVEN PO PACK
1.0000 | PACK | Freq: Two times a day (BID) | ORAL | Status: DC
  Administered 2020-06-13: 1 via ORAL

## 2020-06-13 NOTE — Progress Notes (Signed)
PROGRESS NOTE    Gregory Small  YKZ:[id number] DOB: 07-Jun-1919 DOA: 06/04/2020 PCP: [facility]    CC; right hand swelling Brief Narrative:  Gregory Small a 87 y.o.malewith medical history significant ofhypertension, GERD, hypothyroidism, vertigo, atrial fibrillation on anticoagulants, chronic pain syndrome, CKD stage IIIa,myasthenia gravis after receiving 2 doses of COVID vaccine,who presents with a fall, pain in right hip and right knee, right arm pain and redness. Upon arriving the hospital, he was Covid positive. Right hand was swelling and tender. He was diagnosed with cellulitis of right hand, start vancomycin and Rocephin. Blood culture came back with MSSA, antibiotic switched to cefazolin. Surgical I &D performed 4/11.   Assessment & Plan:   Principal Problem:   Cellulitis of right upper arm Active Problems:   Myasthenia gravis (HCC)   Atrial fibrillation, chronic (HCC)   Hypothyroidism   Chronic pain syndrome   Fall   CKD (chronic kidney disease), stage IIIa   HTN (hypertension)   GERD (gastroesophageal reflux disease)   COVID-19 virus infection   MSSA (methicillin susceptible Staphylococcus aureus) septicemia (HCC)  #1. Sepsis with MSSA septicemia secondary to right arm cellulitis Right arm cellulitis with small right hand abscess due to MSSA. Status post I&D 4/11. Patient blood culture was positive for MRSA, repeat culture was negative. Echocardiogram did not show any valvular changes.  Patient is currently being treated with cefazolin.  He received 2 doses of linezolid when he lost IV.  PICC line placed. --Home prednisone was reportedly increased to 50 mg BID for arm swelling Plan: --cont cefazolin for a total of 4 weeks (stop date 5/7) --follow up with ID outpatient clinic in 3 weeks --follow up in the ortho office next week if discharged, appointment scheduled (Dr. Odis Luster)  Acute kidney injury, improved --encourage oral  hydration  Myasthenia gravis pain --resume home prednisone 20 mg daily  Covid infection. Still asymptomatic.  Chronic atrial fibrillation.   Not chronically on anticoagulation, no tachycardia.  Hypothyroidism --cont synthroid  HTN --home lasix held.   --BP varied widely --reduce hydralazine to 25 mg q8h (new med)  Chronic BLE edema from venous stasis --apply ACE wrap to both legs   DVT prophylaxis: Lovenox Code Status: DNR Family Communication:  Disposition Plan:   Status is: Inpatient   Dispo: The patient is from: ALF              Anticipated d/c is to: SNF whenever bed available, likely on Monday              Patient currently is medically stable to d/c.   Difficult to place patient No   I/O last 3 completed shifts: In: 956.6 [I.V.:44.8; IV Piggyback:911.8] Out: 2100 [Urine:2100] No intake/output data recorded.     Consultants:   ID, orthopedics  Procedures: I &D  Antimicrobials: Cefazolin.  Subjective: No new complaints.  Gregory Small eager to leave the hospital.      Objective: Vitals:   06/12/20 2159 06/13/20 0549 06/13/20 0844 06/13/20 1116  BP: (!) 147/73 (!) 141/56 (!) 98/52 (!) 116/53  Pulse: 64 62 67 74  Resp:   18 18  Temp: 97.8 F (36.6 C) 98.1 F (36.7 C) 97.8 F (36.6 C) 98.6 F (37 C)  TempSrc: Oral Oral    SpO2: 99% 99% 97% 99%  Weight:      Height:        Intake/Output Summary (Last 24 hours) at 06/13/2020 1713 Last data filed at 06/13/2020 0616 Gross per 24 hour  Intake 0 ml  Output 1050 ml  Net -1050 ml   Filed Weights   06/04/20 0924  Weight: 84.9 kg    Examination:  Constitutional: NAD, alert, oriented to person and place HEENT: conjunctivae and lids normal, EOMI CV: No cyanosis.   RESP: normal respiratory effort, on RA Extremities: ACE wrap in BLE and right forearm.   SKIN: warm, dry Neuro: II - XII grossly intact.   Psych: Normal mood and affect.  Appropriate judgement and reason   Data Reviewed: I have  personally reviewed following labs and imaging studies  CBC: Recent Labs  Lab 06/09/20 0426 06/10/20 0447 06/11/20 0447 06/12/20 0539 06/13/20 0546  WBC 15.7* 14.0* 13.0* 13.2* 15.0*  NEUTROABS 13.0*  --   --   --   --   HGB 11.4* 11.5* 11.0* 11.6* 13.5  HCT 35.5* 35.7* 34.3* 35.7* 40.5  MCV 96.2 96.0 96.1 94.9 93.1  PLT 279 292 294 324 260   Basic Metabolic Panel: Recent Labs  Lab 06/09/20 0426 06/10/20 0447 06/11/20 0447 06/12/20 0539 06/13/20 0546  NA 137 138 137 136 137  K 4.4 4.5 4.3 3.9 3.8  CL 100 101 101 98 96*  CO2 GLUCOSE 129* 124* 113* 115* 76  BUN 31* 30* 28* 28* 27*  CREATININE 1.23 1.09 1.04 0.96 1.07  CALCIUM 8.7* 8.4* 8.3* 8.3* 8.4*  MG 2.1 2.3 2.3 2.4 2.1   GFR: Estimated Creatinine Clearance: 40.1 mL/min (by C-G formula based on SCr of 1.07 mg/dL). Liver Function Tests: No results for input(s): AST, ALT, ALKPHOS, BILITOT, PROT, ALBUMIN in the last 168 hours. No results for input(s): LIPASE, AMYLASE in the last 168 hours. No results for input(s): AMMONIA in the last 168 hours. Coagulation Profile: No results for input(s): INR, PROTIME in the last 168 hours. Cardiac Enzymes: No results for input(s): CKTOTAL, CKMB, CKMBINDEX, TROPONINI in the last 168 hours. BNP (last 3 results) No results for input(s): PROBNP in the last 8760 hours. HbA1C: No results for input(s): HGBA1C in the last 72 hours. CBG: No results for input(s): GLUCAP in the last 168 hours. Lipid Profile: No results for input(s): CHOL, HDL, LDLCALC, TRIG, CHOLHDL, LDLDIRECT in the last 72 hours. Thyroid Function Tests: No results for input(s): TSH, T4TOTAL, FREET4, T3FREE, THYROIDAB in the last 72 hours. Anemia Panel: No results for input(s): VITAMINB12, FOLATE, FERRITIN, TIBC, IRON, RETICCTPCT in the last 72 hours. Sepsis Labs: No results for input(s): PROCALCITON, LATICACIDVEN in the last 168 hours.  Recent Results (from the past 240 hour(s))  Culture, blood  (routine x 2)     Status: Abnormal   Collection Time: 06/04/20 11:40 AM   Specimen: BLOOD RIGHT ARM  Result Value Ref Range Status   Specimen Description   Final    BLOOD RIGHT ARM Performed at [facility], [address] [place], [place] [address]    Special Requests   Final    BAA Blood Culture adequate volume Performed at [facility], [address] [place], [place] [address]    Culture  Setup Time   Final    GRAM POSITIVE COCCI ANAEROBIC BOTTLE ONLY Organism ID to follow CRITICAL RESULT CALLED TO, READ BACK BY AND VERIFIED WITH: A.CHAPPEL,PHARMD AT 0981 ON 06/05/20 BY GM Performed at [facility], [address] [place], [place] [address]    Culture STAPHYLOCOCCUS AUREUS (A)  Final   Report Status 06/07/2020 FINAL  Final   Organism ID, Bacteria STAPHYLOCOCCUS AUREUS  Final      Susceptibility   Staphylococcus aureus - MIC*    CIPROFLOXACIN <=0.5 SENSITIVE Sensitive     ERYTHROMYCIN <=0.25 SENSITIVE Sensitive     GENTAMICIN <=0.5 SENSITIVE Sensitive     OXACILLIN 0.5 SENSITIVE Sensitive     TETRACYCLINE <=1 SENSITIVE Sensitive     VANCOMYCIN <=0.5 SENSITIVE Sensitive     TRIMETH/SULFA <=10 SENSITIVE Sensitive     CLINDAMYCIN <=0.25 SENSITIVE Sensitive     RIFAMPIN <=0.5 SENSITIVE Sensitive     Inducible Clindamycin NEGATIVE Sensitive     * STAPHYLOCOCCUS AUREUS  Blood Culture ID Panel (Reflexed)     Status: Abnormal   Collection Time: 06/04/20 11:40 AM  Result Value Ref Range Status   Enterococcus faecalis NOT DETECTED NOT DETECTED Final   Enterococcus Faecium NOT DETECTED NOT DETECTED Final   Listeria monocytogenes NOT DETECTED NOT DETECTED Final   Staphylococcus species DETECTED (A) NOT DETECTED Final    Comment: CRITICAL RESULT CALLED TO, READ BACK BY AND VERIFIED WITH: A.CHAPPEL,PHARMD AT 0917 ON 06/05/20 BY GM    Staphylococcus aureus (BCID) DETECTED (A) NOT DETECTED Final    Comment: CRITICAL RESULT CALLED TO, READ BACK BY AND  VERIFIED WITH: A.CHAPPEL,PHARMD AT 0917 ON 06/05/20 BY GM    Staphylococcus epidermidis NOT DETECTED NOT DETECTED Final   Staphylococcus lugdunensis NOT DETECTED NOT DETECTED Final   Streptococcus species NOT DETECTED NOT DETECTED Final   Streptococcus agalactiae NOT DETECTED NOT DETECTED Final   Streptococcus pneumoniae NOT DETECTED NOT DETECTED Final   Streptococcus pyogenes NOT DETECTED NOT DETECTED Final   A.calcoaceticus-baumannii NOT DETECTED NOT DETECTED Final   Bacteroides fragilis NOT DETECTED NOT DETECTED Final   Enterobacterales NOT DETECTED NOT DETECTED Final   Enterobacter cloacae complex NOT DETECTED NOT DETECTED Final   Escherichia coli NOT DETECTED NOT DETECTED Final   Klebsiella aerogenes NOT DETECTED NOT DETECTED Final   Klebsiella oxytoca NOT DETECTED NOT DETECTED Final   Klebsiella pneumoniae NOT DETECTED NOT DETECTED Final   Proteus species NOT DETECTED NOT DETECTED Final   Salmonella species NOT DETECTED NOT DETECTED Final   Serratia marcescens NOT DETECTED NOT DETECTED Final   Haemophilus influenzae NOT DETECTED NOT DETECTED Final   Neisseria meningitidis NOT DETECTED NOT DETECTED Final   Pseudomonas aeruginosa NOT DETECTED NOT DETECTED Final   Stenotrophomonas maltophilia NOT DETECTED NOT DETECTED Final   Candida albicans NOT DETECTED NOT DETECTED Final   Candida auris NOT DETECTED NOT DETECTED Final   Candida glabrata NOT DETECTED NOT DETECTED Final   Candida krusei NOT DETECTED NOT DETECTED Final   Candida parapsilosis NOT DETECTED NOT DETECTED Final   Candida tropicalis NOT DETECTED NOT DETECTED Final   Cryptococcus neoformans/gattii NOT DETECTED NOT DETECTED Final   Meth resistant mecA/C and MREJ NOT DETECTED NOT DETECTED Final    Comment: Performed at [facility], [address] [place], [place] [address]  SARS CORONAVIRUS 2 (TAT 6-24 HRS) Nasopharyngeal Nasopharyngeal Swab     Status: Abnormal   Collection Time: 06/04/20 11:48 AM   Specimen:  Nasopharyngeal Swab  Result Value Ref Range Status   SARS Coronavirus 2 POSITIVE (A) NEGATIVE Final    Comment: (NOTE) SARS-CoV-2 target nucleic acids are DETECTED.  The SARS-CoV-2 RNA is generally detectable in upper and lower respiratory specimens during the acute phase of infection. Positive results are indicative of the presence of SARS-CoV-2 RNA. Clinical correlation with patient history and other diagnostic information is  necessary to determine patient infection status. Positive results do not rule  out bacterial infection or co-infection with other viruses.  The expected result is Negative.  Fact Sheet for Patients: HairSlick.no  Fact Sheet for Healthcare Providers: quierodirigir.com  This test is not yet approved or cleared by the [place] FDA and  has been authorized for detection and/or diagnosis of SARS-CoV-2 by FDA under an Emergency Use Authorization (EUA). This EUA will remain  in effect (meaning this test can be used) for the duration of the COVID-19 declaration under Section 564(b)(1) of the Act, 21 U. S.C. section 360bbb-3(b)(1), unless the authorization is terminated or revoked sooner.   Performed at [facility], [address], [place], [place] [address]   Culture, blood (Routine X 2) w Reflex to ID Panel     Status: None   Collection Time: 06/05/20 10:55 AM   Specimen: BLOOD LEFT HAND  Result Value Ref Range Status   Specimen Description BLOOD LEFT HAND  Final   Special Requests   Final    BOTTLES DRAWN AEROBIC AND ANAEROBIC Blood Culture adequate volume   Culture   Final    NO GROWTH 5 DAYS Performed at [facility], [address] [place], [place] [address]    Report Status 06/10/2020 FINAL  Final  Culture, blood (Routine X 2) w Reflex to ID Panel     Status: None   Collection Time: 06/05/20 11:51 AM   Specimen: BLOOD  Result Value Ref Range Status   Specimen Description  BLOOD BLOOD LEFT HAND  Final   Special Requests   Final    BOTTLES DRAWN AEROBIC AND ANAEROBIC Blood Culture adequate volume   Culture   Final    NO GROWTH 5 DAYS Performed at [facility], [address] [place], [place] [address]    Report Status 06/10/2020 FINAL  Final  Aerobic/Anaerobic Culture w Gram Stain (surgical/deep wound)     Status: None   Collection Time: 06/06/20  5:22 PM   Specimen: Hand; Abscess  Result Value Ref Range Status   Specimen Description   Final    HAND Performed at [facility], [address], [place], [place] [address]    Special Requests   Final    NONE Performed at [facility], [address] [place], [place] [address]    Gram Stain   Final    FEW WBC PRESENT, PREDOMINANTLY PMN NO ORGANISMS SEEN    Culture   Final    MODERATE STAPHYLOCOCCUS AUREUS NO ANAEROBES ISOLATED Performed at [facility], [address] [place], [place] [address]    Report Status 06/11/2020 FINAL  Final   Organism ID, Bacteria STAPHYLOCOCCUS AUREUS  Final      Susceptibility   Staphylococcus aureus - MIC*    CIPROFLOXACIN <=0.5 SENSITIVE Sensitive     ERYTHROMYCIN <=0.25 SENSITIVE Sensitive     GENTAMICIN <=0.5 SENSITIVE Sensitive     OXACILLIN 0.5 SENSITIVE Sensitive     TETRACYCLINE <=1 SENSITIVE Sensitive     VANCOMYCIN 1 SENSITIVE Sensitive     TRIMETH/SULFA <=10 SENSITIVE Sensitive     CLINDAMYCIN <=0.25 SENSITIVE Sensitive     RIFAMPIN <=0.5 SENSITIVE Sensitive     Inducible Clindamycin NEGATIVE Sensitive     * MODERATE STAPHYLOCOCCUS AUREUS  Aerobic/Anaerobic Culture w Gram Stain (surgical/deep wound)     Status: None (Preliminary result)   Collection Time: 06/07/20  5:04 PM   Specimen: PATH Other; Wound  Result Value Ref Range Status   Specimen Description   Final  FLUID RIGHT WRIST Performed at [facility], [address] [place], [place] [address]    Special Requests   Final    NONE Performed at  [facility], [address] [place], [place] [address]    Gram Stain   Final    NO WBC SEEN RARE GRAM POSITIVE COCCI Performed at [facility], [address] [place], [place] [address]    Culture   Final    FEW STAPHYLOCOCCUS AUREUS NO ANAEROBES ISOLATED; CULTURE IN PROGRESS FOR 5 DAYS    Report Status PENDING  Incomplete   Organism ID, Bacteria STAPHYLOCOCCUS AUREUS  Final      Susceptibility   Staphylococcus aureus - MIC*    CIPROFLOXACIN <=0.5 SENSITIVE Sensitive     ERYTHROMYCIN <=0.25 SENSITIVE Sensitive     GENTAMICIN <=0.5 SENSITIVE Sensitive     OXACILLIN 0.5 SENSITIVE Sensitive     TETRACYCLINE <=1 SENSITIVE Sensitive     VANCOMYCIN <=0.5 SENSITIVE Sensitive     TRIMETH/SULFA <=10 SENSITIVE Sensitive     CLINDAMYCIN <=0.25 SENSITIVE Sensitive     RIFAMPIN <=0.5 SENSITIVE Sensitive     Inducible Clindamycin NEGATIVE Sensitive     * FEW STAPHYLOCOCCUS AUREUS  Aerobic/Anaerobic Culture w Gram Stain (surgical/deep wound)     Status: None (Preliminary result)   Collection Time: 06/07/20  5:04 PM   Specimen: PATH Other; Wound  Result Value Ref Range Status   Specimen Description WOUND RIGHT HAND  Final   Special Requests NONE  Final   Gram Stain   Final    RARE WBC PRESENT,BOTH PMN AND MONONUCLEAR NO ORGANISMS SEEN Performed at [facility], [address] [place], [place] [address]    Culture   Final    FEW STAPHYLOCOCCUS AUREUS NO ANAEROBES ISOLATED; CULTURE IN PROGRESS FOR 5 DAYS    Report Status PENDING  Incomplete   Organism ID, Bacteria STAPHYLOCOCCUS AUREUS  Final      Susceptibility   Staphylococcus aureus - MIC*    CIPROFLOXACIN <=0.5 SENSITIVE Sensitive     ERYTHROMYCIN <=0.25 SENSITIVE Sensitive     GENTAMICIN <=0.5 SENSITIVE Sensitive     OXACILLIN 0.5 SENSITIVE Sensitive     TETRACYCLINE <=1 SENSITIVE Sensitive     VANCOMYCIN 1 SENSITIVE Sensitive     TRIMETH/SULFA <=10 SENSITIVE Sensitive     CLINDAMYCIN <=0.25 SENSITIVE  Sensitive     RIFAMPIN <=0.5 SENSITIVE Sensitive     Inducible Clindamycin NEGATIVE Sensitive     * FEW STAPHYLOCOCCUS AUREUS         Radiology Studies: No results found.      Scheduled Meds: . brimonidine  1 drop Both Eyes BID  . calcium carbonate  600 mg of elemental calcium Oral Q breakfast  . calcium-vitamin D  1 tablet Oral Daily  . Chlorhexidine Gluconate Cloth  6 each Topical Daily  . docusate sodium  100 mg Oral BID  . enoxaparin (LOVENOX) injection  40 mg Subcutaneous Q24H  . feeding supplement  237 mL Oral QHS  . hydrALAZINE  50 mg Oral Q8H  . levothyroxine  88 mcg Oral QAC breakfast  . multivitamin with minerals  1 tablet Oral Daily  . nutrition supplement (JUVEN)  1 packet Oral BID BM  . pantoprazole  40 mg Oral Daily  . predniSONE  20 mg Oral Q breakfast  . pyridostigmine  60 mg Oral TID  . sodium chloride flush  10-40 mL Intracatheter Q12H  . timolol  1 drop  Both Eyes BID   Continuous Infusions: . sodium chloride Stopped (06/12/20 1330)  .  ceFAZolin (ANCEF) IV 2 g (06/13/20 1336)     LOS: 9 days     Darlin Priestly, MD [facility]   To contact the attending provider between 7A-7P or the covering provider during after hours 7P-7A, please log into the web site [email or web address] and access using universal [place] password for that web site. If you do not have the password, please call the hospital operator.  06/13/2020, 5:13 PM

## 2020-06-13 NOTE — Plan of Care (Signed)

## 2020-06-13 NOTE — Progress Notes (Signed)
Initial Nutrition Assessment  DOCUMENTATION CODES:   Not applicable  INTERVENTION:   -MVI with minerals daily -1 packet Juven BID, each packet provides 95 calories, 2.5 grams of protein (collagen), and 9.8 grams of carbohydrate (3 grams sugar); also contains 7 grams of L-arginine and L-glutamine, 300 mg vitamin C, 15 mg vitamin E, 1.2 mcg vitamin B-12, 9.5 mg zinc, 200 mg calcium, and 1.5 g  Calcium Beta-hydroxy-Beta-methylbutyrate to support wound healing -Ensure Enlive po daily, each supplement provides 350 kcal and 20 grams of protein  NUTRITION DIAGNOSIS:   Increased nutrient needs related to acute illness (COVID-19) as evidenced by estimated needs.  GOAL:   Patient will meet greater than or equal to 90% of their needs  MONITOR:   PO intake,Supplement acceptance,Labs,Weight trends,Skin,I & O's  REASON FOR ASSESSMENT:   Malnutrition Screening Tool    ASSESSMENT:   Gregory Small is a 87 y.o. male with medical history significant of hypertension, GERD, hypothyroidism, vertigo, atrial fibrillation on anticoagulants, chronic pain syndrome, CKD stage IIIa, myasthenia gravis after receiving 2 doses of COVID vaccine, who presents with a fall, pain in right hip and right knee, right arm pain and redness.  Pt admitted with sepsis due to cellulitis of rt upper arm.   4/8- COVID-19 + 4/10- MRI of hand and wrist does not reveal septic wrist or osteomyelitis 4/11- s/p I&D of rt hand and wrist 4/12- PICC placed  Reviewed I/O's: -1.6 L x 24 hours and +358 ml since admission  UOP: 1.8 L x 24 hours  Pt unavailable at time of visit. Attempted to speak with pt via call to hospital room phone, however, unable to reach.   Pt with fair to good appetite. Noted meal completion 50-75%.  Reviewed wt hx; wt has been stable over the past 2 months. Noted pt with moderate edema, which may be masking true weight loss as well as fat and muscle depletions.  Medications reviewed and include  calcium carbonate, calcium with vitamin D, colace, prednisone, and ancef.   Per TOC notes, pt is medically stable for discharge for SNF. SNF bed will likely be available on 06/14/20. Pt will require 4 weeks on IV antibiotics prior to returning to ALF.   Labs reviewed.   Diet Order:   Diet Order            Diet regular Room service appropriate? Yes; Fluid consistency: Thin  Diet effective now                 EDUCATION NEEDS:   No education needs have been identified at this time  Skin:  Skin Assessment: Skin Integrity Issues: Skin Integrity Issues:: Incisions Incisions: closed rt arm  Last BM:  06/13/20  Height:   Ht Readings from Last 1 Encounters:  06/04/20 5\' 7"  (1.702 m)    Weight:   Wt Readings from Last 1 Encounters:  06/04/20 84.9 kg    Ideal Body Weight:  67.3 kg  BMI:  Body mass index is 29.3 kg/m.  Estimated Nutritional Needs:   Kcal:  1900-2100  Protein:  100-115 grams  Fluid:  > 1.9 L    08/04/20, RD, LDN, CDCES Registered Dietitian II Certified Diabetes Care and Education Specialist Please refer to [facility] for RD and/or RD on-call/weekend/after hours pager

## 2020-06-13 NOTE — TOC Progression Note (Signed)
Transition of Care [facility]) - Progression Note    Patient Details  Name: Gregory Small MRN: [id number] Date of Birth: 24-Nov-1919  [facility] of [facility]) CM/SW Contact  Liliana Cline, LCSW Phone Number: 06/13/2020, 3:06 PM  Clinical Narrative:   CSW informed that patient's family has questions about placement. Spoke to patient's daughter Revonda Standard and she is aware of plan for patient to DC to [facility] tomorrow morning.     Expected Discharge Plan: Skilled Nursing Facility Barriers to Discharge: Continued Medical Work up  Expected Discharge Plan and Services Expected Discharge Plan: Skilled Nursing Facility       Living arrangements for the past 2 months: Assisted Living Facility                                       Social Determinants of Health (SDOH) Interventions    Readmission Risk Interventions Readmission Risk Prevention Plan 06/06/2020  Transportation Screening Complete  PCP or Specialist Appt within 3-5 Days Complete  HRI or Home Care Consult Complete  Social Work Consult for Recovery Care Planning/Counseling Complete  Palliative Care Screening Not Applicable  Medication Review Oceanographer) Complete  Some recent data might be hidden

## 2020-06-14 DIAGNOSIS — L03113 Cellulitis of right upper limb: Secondary | ICD-10-CM | POA: Diagnosis not present

## 2020-06-14 LAB — BASIC METABOLIC PANEL
Anion gap: 9 (ref 5–15)
BUN: 28 mg/dL — ABNORMAL HIGH (ref 8–23)
CO2: 30 mmol/L (ref 22–32)
Calcium: 8.4 mg/dL — ABNORMAL LOW (ref 8.9–10.3)
Chloride: 95 mmol/L — ABNORMAL LOW (ref 98–111)
Creatinine, Ser: 0.99 mg/dL (ref 0.61–1.24)
GFR, Estimated: >60 mL/min (ref >60)
Glucose, Bld: 82 mg/dL (ref 70–99)
Potassium: 3.8 mmol/L (ref 3.5–5.1)
Sodium: 134 mmol/L — ABNORMAL LOW (ref 135–145)

## 2020-06-14 LAB — CBC
HCT: 37.2 % — ABNORMAL LOW (ref 39.0–52.0)
Hemoglobin: 12.1 g/dL — ABNORMAL LOW (ref 13.0–17.0)
MCH: 31 pg (ref 26.0–34.0)
MCHC: 32.5 g/dL (ref 30.0–36.0)
MCV: 95.4 fL (ref 80.0–100.0)
Platelets: 333 10*3/uL (ref 150–400)
RBC: 3.9 MIL/uL — ABNORMAL LOW (ref 4.22–5.81)
RDW: 16.1 % — ABNORMAL HIGH (ref 11.5–15.5)
WBC: 16.3 10*3/uL — ABNORMAL HIGH (ref 4.0–10.5)
nRBC: 0.1 % (ref 0.0–0.2)

## 2020-06-14 LAB — MAGNESIUM: Magnesium: 2.3 mg/dL (ref 1.7–2.4)

## 2020-06-14 MED ORDER — CEFAZOLIN IV (FOR PTA / DISCHARGE USE ONLY): 2.0000 g | Freq: Three times a day (TID) | INTRAVENOUS | 0 refills | Status: AC

## 2020-06-14 MED ORDER — JUVEN PO PACK: 1.0000 | PACK | Freq: Two times a day (BID) | ORAL | 0 refills | Status: AC

## 2020-06-14 MED ORDER — HALOPERIDOL LACTATE 5 MG/ML IJ SOLN
1.0000 mg | Freq: Four times a day (QID) | INTRAMUSCULAR | Status: DC | PRN
  Administered 2020-06-14: 1 mg via INTRAMUSCULAR
  Filled 2020-06-14: qty 1

## 2020-06-14 MED ORDER — DOCUSATE SODIUM 100 MG PO CAPS
100.0000 mg | ORAL_CAPSULE | Freq: Two times a day (BID) | ORAL | 0 refills | Status: AC | PRN
Start: 2020-06-14 — End: ?

## 2020-06-14 MED ORDER — ENSURE ENLIVE PO LIQD: 237.0000 mL | Freq: Every day | ORAL | 12 refills | Status: AC

## 2020-06-14 MED ORDER — ADULT MULTIVITAMIN W/MINERALS CH: 1.0000 | ORAL_TABLET | Freq: Every day | ORAL | Status: AC

## 2020-06-14 MED ORDER — HYDRALAZINE HCL 25 MG PO TABS
25.0000 mg | ORAL_TABLET | Freq: Three times a day (TID) | ORAL | Status: DC
  Administered 2020-06-14: 25 mg via ORAL
  Filled 2020-06-14: qty 1

## 2020-06-14 NOTE — Discharge Summary (Addendum)
Physician Discharge Summary   Gregory Small  male DOB: [birth year]  ZOX:[id number]  PCP: [facility]  Admit date: 06/04/2020 Discharge date: 06/14/2020  Admitted From: home Disposition:  SNF CODE STATUS: DNR  Discharge Instructions    Home infusion instructions   Complete by: As directed    Instructions: Flushing of vascular access device: 0.9% NaCl pre/post medication administration and prn patency; Heparin 100 u/ml, 5ml for implanted ports and Heparin 10u/ml, 5ml for all other central venous catheters.   No wound care   Complete by: As directed        Hospital Course:  For full details, please see H&P, progress notes, consult notes and ancillary notes.  Briefly,  Gregory Small a 87 y.o.malewith medical history significant ofhypertension, GERD, hypothyroidism, vertigo, chronic atrial fibrillation, chronic pain syndrome, CKD stage IIIa,myasthenia gravis after receiving 2 doses of COVID vaccine,who presented with a fall, pain in right hip and right knee, right arm pain and redness.  Upon arriving the hospital, he was Covid positive. Right hand was swelling and tender. He was diagnosed with cellulitis of right hand, start on vancomycin and Rocephin. Blood culture came back with MSSA, antibiotic switched to cefazolin. Surgical I &D performed 4/11.  Severe Sepsis with MSSA septicemia secondary to right arm cellulitis Right arm cellulitis with small right hand abscess due to MSSA. Status post I&D 4/11. Patient meets criteria for sepsis with leukocytosis with WBC 15.8, tachycardia with heart rate of 102, with AKI. Echocardiogram did not show any valvular changes.  PICC line placed. --cont cefazolin for a total of 4 weeks (stop date 5/7) --follow up with ID outpatient clinic in 2 weeks --follow up in the ortho office (Dr. Odis Luster) 1 week after discharge.  Acute kidney injury, resolved Cr 1.48 on presentation.  Cr 0.99 prior to discharge.     Myasthenia gravis  Home prednisone was reportedly increased to 50 mg BID for arm swelling.  Decreased back down to home prednisone 20 mg daily prior to discharge. Continued home pyridostigmine.  Covid infection Still asymptomatic.  Did not need treatment.  Chronic atrial fibrillation.  Not chronically on anticoagulation, rate controlled.  Hypothyroidism --cont synthroid  HTN Home Lasix held during hospitalization due to AKI, and resumed at discharge.   --BP varied widely.  Pt received PRN hydralazine for elevated BP.  But due to intermittent lows, pt was not started on new BP medication at discharge.  Chronic BLE edema from venous stasis --apply ACE wrap to both legs, from above the toes to below the knees.  Re-wrap daily in the morning.   Discharge Diagnoses:  Principal Problem:   Cellulitis of right upper arm Active Problems:   Myasthenia gravis (HCC)   Atrial fibrillation, chronic (HCC)   Hypothyroidism   Chronic pain syndrome   Fall   CKD (chronic kidney disease), stage IIIa   HTN (hypertension)   GERD (gastroesophageal reflux disease)   COVID-19 virus infection   MSSA (methicillin susceptible Staphylococcus aureus) septicemia (HCC)   30 Day Unplanned Readmission Risk Score   Flowsheet Row ED to Hosp-Admission (Current) from 06/04/2020 [facility]  30 Day Unplanned Readmission Risk Score (%) 31.99 Filed at 06/14/2020 0801     This score is the patient's risk of an unplanned readmission within 30 days of being discharged (0 -100%). The score is based on dignosis, age, lab data, medications, orders, and past utilization.   Low:  0-14.9   Medium: 15-21.9   High:  22-29.9   Extreme: 30 and above        Discharge Instructions:  Allergies as of 06/14/2020      Reactions   Simvastatin Other (See Comments)   Myalgias    Sulfa Antibiotics       Medication List    STOP taking these medications   mycophenolate 500 MG  tablet Commonly known as: CELLCEPT   oxyCODONE 10 mg 12 hr tablet Commonly known as: OXYCONTIN   oxyCODONE 5 MG immediate release tablet Commonly known as: Oxy IR/ROXICODONE   traMADol 50 MG tablet Commonly known as: ULTRAM     TAKE these medications   acetaminophen 500 MG tablet Commonly known as: TYLENOL Take 500-1,000 mg by mouth every 8 (eight) hours as needed for pain.   albuterol 108 (90 Base) MCG/ACT inhaler Commonly known as: VENTOLIN HFA Inhale 1-2 puffs into the lungs every 6 (six) hours as needed for wheezing or shortness of breath.   brimonidine 0.2 % ophthalmic solution Commonly known as: ALPHAGAN Place 1 drop into both eyes 2 (two) times daily.   calcium carbonate 1500 (600 Ca) MG Tabs tablet Commonly known as: OSCAL Take 600 mg of elemental calcium by mouth daily with breakfast.   Caltrate 600+D Plus Minerals 600-800 MG-UNIT Tabs Take 1 tablet by mouth daily.   ceFAZolin  IVPB Commonly known as: ANCEF Inject 2 g into the vein every 8 (eight) hours for 24 days. Indication: MSSA bacteremia First Dose: Yes Last Day of Therapy: 07/03/2020 Labs - Once weekly:  CBC/D, CMP, and CRP Method of administration may be changed at the discretion of home infusion pharmacist based upon assessment of the patient and/or caregiver's ability to self-administer the medication ordered.   docusate sodium 100 MG capsule Commonly known as: COLACE Take 1 capsule (100 mg total) by mouth 2 (two) times daily as needed for mild constipation.   feeding supplement Liqd Take 237 mLs by mouth at bedtime.   nutrition supplement (JUVEN) Pack Take 1 packet by mouth 2 (two) times daily between meals.   furosemide 20 MG tablet Commonly known as: LASIX Take 20 mg by mouth daily.   levothyroxine 88 MCG tablet Commonly known as: SYNTHROID Take 88 mcg by mouth daily before breakfast.   loperamide 2 MG tablet Commonly known as: IMODIUM A-D Take 2-4 mg by mouth 4 (four) times daily as  needed for diarrhea or loose stools. TAKE TWO TABLETS BY MOUTH AFTER 1ST LOOSE STOOL THEN 1 TABLET BY MOUTH AFTER EACH SUBSEQUENT LOOSE STOOL. ( DO NOT EXCEED 4 TABLETS IN 24 HOURS) **IF DIARRHEA PERSISTS LONGER THAN 24 HOURS CALL M.D.   meclizine 25 MG tablet Commonly known as: ANTIVERT Take 1 tablet (25 mg total) by mouth 3 (three) times daily.   multivitamin with minerals Tabs tablet Take 1 tablet by mouth daily.   pantoprazole 40 MG tablet Commonly known as: PROTONIX Take 40 mg by mouth daily.   predniSONE 20 MG tablet Commonly known as: DELTASONE Take 20 mg by mouth daily.   pyridostigmine 60 MG tablet Commonly known as: MESTINON Take 60 mg by mouth 3 (three) times daily.   timolol 0.25 % ophthalmic solution Commonly known as: TIMOPTIC Apply 1 drop to eye 2 (two) times daily.            Home Infusion Instuctions  (From admission, onward)         Start     Ordered   06/14/20 0000  Home infusion instructions  Question:  Instructions  Answer:  Flushing of vascular access device: 0.9% NaCl pre/post medication administration and prn patency; Heparin 100 u/ml, 5ml for implanted ports and Heparin 10u/ml, 5ml for all other central venous catheters.   06/14/20 0920           Follow-up Information    [facility] Schedule an appointment as soon as possible for a visit in 1 week(s).   Specialty: Geriatric Medicine Contact information: [address] [place] [address] [phone]        Mick Sell, MD. Schedule an appointment as soon as possible for a visit in 2 week(s).   Specialty: Infectious Diseases Contact information: [address] [place] [address] [phone]        Lyndle Herrlich, MD. Schedule an appointment as soon as possible for a visit in 1 week(s).   Specialty: Orthopedic Surgery Contact information: [address] [place] [address] [phone]                Allergies  Allergen Reactions  . Simvastatin Other (See Comments)    Myalgias   . Sulfa Antibiotics      The results of significant diagnostics from this hospitalization (including imaging, microbiology, ancillary and laboratory) are listed below for reference.   Consultations:   Procedures/Studies: CT Head Wo Contrast  Result Date: 06/04/2020 CLINICAL DATA:  Unwitnessed fall. EXAM: CT HEAD WITHOUT CONTRAST CT CERVICAL SPINE WITHOUT CONTRAST TECHNIQUE: Multidetector CT imaging of the head and cervical spine was performed following the standard protocol without intravenous contrast. Multiplanar CT image reconstructions of the cervical spine were also generated. COMPARISON:  CT head April 27, 2020 FINDINGS: CT HEAD FINDINGS Brain: No evidence of acute large vascular territory infarction, hemorrhage, hydrocephalus, extra-axial collection or mass lesion/mass effect. Similar generalized cerebral atrophy with ex vacuo ventricular dilation. Similar moderate patchy and confluent white matter hypoattenuation, most likely related to chronic microvascular ischemic disease. Similar mineralization along the falx and tentorial leaflets. Vascular: No hyperdense vessel identified. Intracranial and extracranial calcific atherosclerosis. Skull: No acute fracture. Sinuses/Orbits: Mild ethmoid air cell mucosal thickening without visible air-fluid level. No acute abnormality in the visualized orbits. Other: Small left mastoid effusion. CT CERVICAL SPINE FINDINGS Alignment: Straightening of the normal cervical lordosis. Slight anterolisthesis of C2 on C3, favor degenerative given facet arthropathy at this level. Broad levocurvature. Skull base and vertebrae: Degenerative Schmorl's nodes involving the inferior T1 and T2 endplates. No evidence of acute fracture. Vertebral body heights are maintained. Diffuse osteopenia. Soft tissues and spinal canal: No prevertebral fluid or swelling. No visible canal hematoma. Disc  levels: Severe degenerative disc disease at multiple levels with disc height loss, endplate sclerosis/cystic change, and posterior disc osteophyte complexes. Partial osseous fusion across the C5-C6 disc and across the C4-C5 right facet joint. Bulky multilevel facet/uncovertebral hypertrophy with suspected severe foraminal stenosis on the right at C3-C4. Upper chest: No consolidation in the visualized lung apices. Other: Calcific atherosclerosis of the carotid arteries. IMPRESSION: CT head: 1. No evidence of acute intracranial abnormality. 2. Similar atrophy and chronic microvascular ischemic disease. CT cervical spine: 1. No evidence of acute fracture or traumatic malalignment the cervical spine. 2. Severe multilevel degenerative disc disease and bulky multilevel facet/uncovertebral hypertrophy with suspected severe foraminal stenosis on the right at C3-C4. MRI could better evaluate the canal/cord/foramina if clinically indicated. Electronically Signed   By: Feliberto Harts MD   On: 06/04/2020 10:29   CT Cervical Spine Wo Contrast  Result  Date: 06/04/2020 CLINICAL DATA:  Unwitnessed fall. EXAM: CT HEAD WITHOUT CONTRAST CT CERVICAL SPINE WITHOUT CONTRAST TECHNIQUE: Multidetector CT imaging of the head and cervical spine was performed following the standard protocol without intravenous contrast. Multiplanar CT image reconstructions of the cervical spine were also generated. COMPARISON:  CT head April 27, 2020 FINDINGS: CT HEAD FINDINGS Brain: No evidence of acute large vascular territory infarction, hemorrhage, hydrocephalus, extra-axial collection or mass lesion/mass effect. Similar generalized cerebral atrophy with ex vacuo ventricular dilation. Similar moderate patchy and confluent white matter hypoattenuation, most likely related to chronic microvascular ischemic disease. Similar mineralization along the falx and tentorial leaflets. Vascular: No hyperdense vessel identified. Intracranial and extracranial  calcific atherosclerosis. Skull: No acute fracture. Sinuses/Orbits: Mild ethmoid air cell mucosal thickening without visible air-fluid level. No acute abnormality in the visualized orbits. Other: Small left mastoid effusion. CT CERVICAL SPINE FINDINGS Alignment: Straightening of the normal cervical lordosis. Slight anterolisthesis of C2 on C3, favor degenerative given facet arthropathy at this level. Broad levocurvature. Skull base and vertebrae: Degenerative Schmorl's nodes involving the inferior T1 and T2 endplates. No evidence of acute fracture. Vertebral body heights are maintained. Diffuse osteopenia. Soft tissues and spinal canal: No prevertebral fluid or swelling. No visible canal hematoma. Disc levels: Severe degenerative disc disease at multiple levels with disc height loss, endplate sclerosis/cystic change, and posterior disc osteophyte complexes. Partial osseous fusion across the C5-C6 disc and across the C4-C5 right facet joint. Bulky multilevel facet/uncovertebral hypertrophy with suspected severe foraminal stenosis on the right at C3-C4. Upper chest: No consolidation in the visualized lung apices. Other: Calcific atherosclerosis of the carotid arteries. IMPRESSION: CT head: 1. No evidence of acute intracranial abnormality. 2. Similar atrophy and chronic microvascular ischemic disease. CT cervical spine: 1. No evidence of acute fracture or traumatic malalignment the cervical spine. 2. Severe multilevel degenerative disc disease and bulky multilevel facet/uncovertebral hypertrophy with suspected severe foraminal stenosis on the right at C3-C4. MRI could better evaluate the canal/cord/foramina if clinically indicated. Electronically Signed   By: Feliberto Harts MD   On: 06/04/2020 10:29   MR HAND RIGHT WO CONTRAST  Result Date: 06/06/2020 CLINICAL DATA:  Right hand pain and swelling.  Bacteremia. EXAM: MRI OF THE RIGHT HAND WITHOUT CONTRAST TECHNIQUE: Multiplanar, multisequence MR imaging of the  right hand was performed. No intravenous contrast was administered. COMPARISON:  Right hand x-rays dated June 05, 2020. FINDINGS: Despite efforts by the technologist and patient, motion artifact is present on today's exam and could not be eliminated. This reduces exam sensitivity and specificity. Bones/Joint/Cartilage No suspicious marrow signal abnormality. No fracture or dislocation. Mild osteoarthritis of the wrist, MCP joints, and IP joints. Large cyst in the lunate. Small midcarpal joint effusion. Ligaments Collateral ligaments are intact. Muscles and Tendons Flexor and extensor tendons are intact. There is prominent soft tissue edema around the extensor tendons but not within the tendon sheaths themselves. Soft tissue Diffuse soft tissue swelling, most severe over the dorsal hand. No fluid collection or hematoma. No soft tissue mass. IMPRESSION: 1. Diffuse soft tissue swelling, most severe over the dorsal hand, consistent with reported history of cellulitis. No abscess. 2. No evidence of osteomyelitis. 3. No tenosynovitis. Prominent soft tissue edema around the extensor tendons but not within the tendon sheaths themselves. Electronically Signed   By: Obie Dredge M.D.   On: 06/06/2020 09:31   DG Hand 2 View Right  Result Date: 06/05/2020 CLINICAL DATA:  Right upper arm cellulitis with right hand pain and swelling. EXAM: RIGHT  HAND - 2 VIEW COMPARISON:  None. FINDINGS: Diffuse dorsal soft tissue swelling, most pronounced at the level of the distal metacarpals. No fractures, bone destruction or periosteal reaction. No soft tissue gas. Mild-to-moderate 3rd MCP joint degenerative changes. Extensive arterial calcifications. IMPRESSION: 1. Diffuse dorsal soft tissue swelling without underlying bony abnormality. 2. Extensive atheromatous arterial calcifications. Electronically Signed   By: Beckie Salts M.D.   On: 06/05/2020 15:39   US Venous Img Upper Uni Right(DVT)  Result Date: 06/04/2020 CLINICAL DATA:   Right upper extremity cellulitis EXAM: RIGHT UPPER EXTREMITY VENOUS DOPPLER ULTRASOUND TECHNIQUE: Gray-scale sonography with graded compression, as well as color Doppler and duplex ultrasound were performed to evaluate the upper extremity deep venous system from the level of the subclavian vein and including the jugular, axillary, basilic, radial, ulnar and upper cephalic vein. Spectral Doppler was utilized to evaluate flow at rest and with distal augmentation maneuvers. COMPARISON:  None. FINDINGS: Contralateral Subclavian Vein: Respiratory phasicity is normal and symmetric with the symptomatic side. No evidence of thrombus. Normal compressibility. Internal Jugular Vein: No evidence of thrombus. Normal compressibility, respiratory phasicity and response to augmentation. Subclavian Vein: No evidence of thrombus. Normal compressibility, respiratory phasicity and response to augmentation. Axillary Vein: No evidence of thrombus. Normal compressibility, respiratory phasicity and response to augmentation. Cephalic Vein: No evidence of thrombus. Normal compressibility, respiratory phasicity and response to augmentation. Basilic Vein: No evidence of thrombus. Normal compressibility, respiratory phasicity and response to augmentation. Brachial Veins: No evidence of thrombus. Normal compressibility, respiratory phasicity and response to augmentation. Radial Veins: No evidence of thrombus. Normal compressibility, respiratory phasicity and response to augmentation. Ulnar Veins: No evidence of thrombus. Normal compressibility, respiratory phasicity and response to augmentation. Venous Reflux:  None visualized. Other Findings:  Soft tissue edema about the forearm. IMPRESSION: 1.  No evidence of DVT within the right upper extremity. 2.  Soft tissue edema about the forearm. Electronically Signed   By: Lauralyn Primes M.D.   On: 06/04/2020 14:22   DG Chest Port 1 View  Result Date: 06/08/2020 CLINICAL DATA:  PICC placement. EXAM:  PORTABLE CHEST 1 VIEW COMPARISON:  June 04, 2020. FINDINGS: Stable cardiomediastinal silhouette. Interval placement of left-sided PICC line with distal tip in expected position of right atrium. No pneumothorax or pleural effusion is noted. Minimal bibasilar subsegmental atelectasis is noted. Bony thorax is unremarkable. IMPRESSION: Interval placement of left-sided PICC line with distal tip in expected position of right atrium. Electronically Signed   By: Lupita Raider M.D.   On: 06/08/2020 17:42   DG Chest Port 1 View  Result Date: 06/04/2020 CLINICAL DATA:  Unwitnessed fall EXAM: PORTABLE CHEST 1 VIEW COMPARISON:  Prior chest x-ray 04/27/2020 FINDINGS: Stable cardiac and mediastinal contours. Atherosclerotic calcification present in the transverse aorta. Stable appearance of the lungs with chronic bronchitic changes and mild interstitial prominence. No focal consolidation, pleural effusion or pneumothorax. No acute fracture identified. IMPRESSION: No active disease. Electronically Signed   By: Malachy Moan M.D.   On: 06/04/2020 11:20   DG Knee Complete 4 Views Right  Result Date: 06/04/2020 CLINICAL DATA:  Pain following fall EXAM: RIGHT KNEE - COMPLETE 4+ VIEW COMPARISON:  None. FINDINGS: Frontal, lateral, and bilateral oblique views were obtained. There is no fracture or dislocation. No evident joint effusion. There is moderate joint space narrowing medially. There is slight joint space narrowing elsewhere. There is chondrocalcinosis. There are foci of arterial vascular calcification. IMPRESSION: No fracture or dislocation. No joint effusion. Osteoarthritic change, most notably medially.  Chondrocalcinosis present, a finding that may be seen with osteoarthritis or calcium pyrophosphate deposition disease. Foci of atherosclerotic arterial vascular calcification noted. Electronically Signed   By: Bretta Bang III M.D.   On: 06/04/2020 10:34   ECHOCARDIOGRAM COMPLETE  Result Date: 06/07/2020     ECHOCARDIOGRAM REPORT   Patient Name:   MILBURN FREENEY [facility] Date of Exam: 06/07/2020 Medical Rec #:  [id number]           Height:       67.0 in Accession #:    [id number]          Weight:       187.1 lb Date of Birth:  06/13/1919            BSA:          1.966 m Patient Age:    87 years            BP:           157/91 mmHg Patient Gender: M                   HR:           56 bpm. Exam Location:  [facility] Procedure: 2D Echo, Color Doppler and Cardiac Doppler Indications:     R78.81 Bacteremia  History:         Patient has no prior history of Echocardiogram examinations.                  Risk Factors:Hypertension. Pt tested positive for COVID-19 on                  06/04/2020.  Sonographer:     Humphrey Rolls RDCS (AE) Referring Phys:  [id number] Marrion Coy Diagnosing Phys: Yvonne Kendall MD  Sonographer Comments: Suboptimal subcostal window. IMPRESSIONS  1. Left ventricular ejection fraction, by estimation, is 55 to 60%. The left ventricle has normal function. The left ventricle has no regional wall motion abnormalities. Left ventricular diastolic parameters are indeterminate.  2. Right ventricular systolic function is normal. The right ventricular size is normal. There is moderately elevated pulmonary artery systolic pressure.  3. Right atrial size was mildly dilated.  4. The mitral valve is degenerative. Mild mitral valve regurgitation. No evidence of mitral stenosis. Moderate mitral annular calcification.  5. The aortic valve has an indeterminant number of cusps. There is mild calcification of the aortic valve. There is moderate thickening of the aortic valve. Aortic valve regurgitation is not visualized. Mild to moderate aortic valve sclerosis/calcification is present, without any evidence of aortic stenosis.  6. The inferior vena cava is dilated in size with <50% respiratory variability, suggesting right atrial pressure of 15 mmHg. FINDINGS  Left Ventricle: Left ventricular ejection fraction, by estimation, is 55 to 60%. The  left ventricle has normal function. The left ventricle has no regional wall motion abnormalities. The left ventricular internal cavity size was normal in size. There is  borderline left ventricular hypertrophy. Left ventricular diastolic parameters are indeterminate. Right Ventricle: The right ventricular size is normal. No increase in right ventricular wall thickness. Right ventricular systolic function is normal. There is moderately elevated pulmonary artery systolic pressure. The tricuspid regurgitant velocity is 2.78 m/s, and with an assumed right atrial pressure of 15 mmHg, the estimated right ventricular systolic pressure is 45.9 mmHg. Left Atrium: Left atrial size was normal in size. Right Atrium: Right atrial size was mildly dilated. Pericardium: There is no evidence of pericardial effusion. Mitral Valve: The  mitral valve is degenerative in appearance. There is moderate thickening of the mitral valve leaflet(s). There is mild calcification of the mitral valve leaflet(s). Moderate mitral annular calcification. Mild mitral valve regurgitation.  No evidence of mitral valve stenosis. MV peak gradient, 8.9 mmHg. The mean mitral valve gradient is 3.0 mmHg. Tricuspid Valve: The tricuspid valve is not well visualized. Tricuspid valve regurgitation is trivial. Aortic Valve: The aortic valve has an indeterminant number of cusps. There is mild calcification of the aortic valve. There is moderate thickening of the aortic valve. Aortic valve regurgitation is not visualized. Mild to moderate aortic valve sclerosis/calcification is present, without any evidence of aortic stenosis. Aortic valve mean gradient measures 5.0 mmHg. Aortic valve peak gradient measures 9.1 mmHg. Aortic valve area, by VTI measures 1.14 cm. Pulmonic Valve: The pulmonic valve was grossly normal. Pulmonic valve regurgitation is not visualized. No evidence of pulmonic stenosis. Aorta: The aortic root is normal in size and structure. Venous: The  inferior vena cava is dilated in size with less than 50% respiratory variability, suggesting right atrial pressure of 15 mmHg. IAS/Shunts: The interatrial septum was not well visualized.  LEFT VENTRICLE PLAX 2D LVIDd:         3.70 cm  Diastology LVIDs:         2.30 cm  LV e' medial:    7.18 cm/s LV PW:         1.10 cm  LV E/e' medial:  17.9 LV IVS:        0.90 cm  LV e' lateral:   6.85 cm/s LVOT diam:     2.10 cm  LV E/e' lateral: 18.8 LV SV:         43 LV SV Index:   22 LVOT Area:     3.46 cm  RIGHT VENTRICLE RV Basal diam:  2.70 cm LEFT ATRIUM           Index       RIGHT ATRIUM           Index LA diam:      3.70 cm 1.88 cm/m  RA Area:     19.80 cm LA Vol (A2C): 64.0 ml 32.56 ml/m RA Volume:   51.50 ml  26.20 ml/m LA Vol (A4C): 32.9 ml 16.74 ml/m  AORTIC VALVE                    PULMONIC VALVE AV Area (Vmax):    1.51 cm     PV Vmax:       0.88 m/s AV Area (Vmean):   1.32 cm     PV Vmean:      61.200 cm/s AV Area (VTI):     1.14 cm     PV VTI:        0.186 m AV Vmax:           151.00 cm/s  PV Peak grad:  3.1 mmHg AV Vmean:          110.000 cm/s PV Mean grad:  2.0 mmHg AV VTI:            0.380 m AV Peak Grad:      9.1 mmHg AV Mean Grad:      5.0 mmHg LVOT Vmax:         66.00 cm/s LVOT Vmean:        41.900 cm/s LVOT VTI:          0.125 m LVOT/AV VTI ratio: 0.33  AORTA Ao  Root diam: 3.40 cm MITRAL VALVE                TRICUSPID VALVE MV Area (PHT): 3.10 cm     TR Peak grad:   30.9 mmHg MV Area VTI:   1.23 cm     TR Vmax:        278.00 cm/s MV Peak grad:  8.9 mmHg MV Mean grad:  3.0 mmHg     SHUNTS MV Vmax:       1.49 m/s     Systemic VTI:  0.12 m MV Vmean:      72.6 cm/s    Systemic Diam: 2.10 cm MV Decel Time: 245 msec MV E velocity: 128.50 cm/s Yvonne Kendall MD Electronically signed by Yvonne Kendall MD Signature Date/Time: 06/07/2020/4:00:35 PM    Final    DG Hip Unilat With Pelvis 2-3 Views Right  Result Date: 06/04/2020 CLINICAL DATA:  Pain following fall EXAM: DG HIP (WITH OR WITHOUT PELVIS) 2-3V  RIGHT COMPARISON:  Pelvis CT April 19, 2020 FINDINGS: Frontal pelvis as well as frontal and lateral right hip images obtained. 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 Bones are diffusely osteoporotic. There is no acute fracture or dislocation. There is mild narrowing of the left hip joint. There is postoperative change in the lower lumbar and upper sacral regions. Multifocal arterial vascular calcification noted. IMPRESSION: Total hip replacement on the right with prosthetic components well-seated. No fracture or dislocation. Underlying osteoporosis. Postoperative change lower lumbar spine and upper sacrum. Slight narrowing left hip joint. Atherosclerotic arterial vascular calcification present. Electronically Signed   By: Bretta Bang III M.D.   On: 06/04/2020 10:33   [place] EKG SITE RITE  Result Date: 06/07/2020 If Site Rite image not attached, placement could not be confirmed due to current cardiac rhythm.     Labs: BNP (last 3 results) Recent Labs    06/04/20 1129  BNP 192.6*   Basic Metabolic Panel: Recent Labs  Lab 06/10/20 0447 06/11/20 0447 06/12/20 0539 06/13/20 0546 06/14/20 0450  NA 138 137 136 137 134*  K 4.5 4.3 3.9 3.8 3.8  CL 101 101 98 96* 95*  CO2 27 28 30 29 30   GLUCOSE 124* 113* 115* 76 82  BUN 30* 28* 28* 27* 28*  CREATININE 1.09 1.04 0.96 1.07 0.99  CALCIUM 8.4* 8.3* 8.3* 8.4* 8.4*  MG 2.3 2.3 2.4 2.1 2.3   Liver Function Tests: No results for input(s): AST, ALT, ALKPHOS, BILITOT, PROT, ALBUMIN in the last 168 hours. No results for input(s): LIPASE, AMYLASE in the last 168 hours. No results for input(s): AMMONIA in the last 168 hours. CBC: Recent Labs  Lab 06/09/20 0426 06/10/20 0447 06/11/20 0447 06/12/20 0539 06/13/20 0546 06/14/20 0450  WBC 15.7* 14.0* 13.0* 13.2* 15.0* 16.3*  NEUTROABS 13.0*  --   --   --   --   --   HGB 11.4* 11.5* 11.0* 11.6* 13.5 12.1*  HCT 35.5* 35.7* 34.3*  35.7* 40.5 37.2*  MCV 96.2 96.0 96.1 94.9 93.1 95.4  PLT 279 292 294 324 260 333   Cardiac Enzymes: No results for input(s): CKTOTAL, CKMB, CKMBINDEX, TROPONINI in the last 168 hours. BNP: Invalid input(s): POCBNP CBG: No results for input(s): GLUCAP in the last 168 hours. D-Dimer No results for input(s): DDIMER in the last 72 hours. Hgb A1c No results for input(s): HGBA1C in the last 72 hours. Lipid Profile No results for input(s): CHOL, HDL, LDLCALC,  TRIG, CHOLHDL, LDLDIRECT in the last 72 hours. Thyroid function studies No results for input(s): TSH, T4TOTAL, T3FREE, THYROIDAB in the last 72 hours.  Invalid input(s): FREET3 Anemia work up No results for input(s): VITAMINB12, FOLATE, FERRITIN, TIBC, IRON, RETICCTPCT in the last 72 hours. Urinalysis    Component Value Date/Time   COLORURINE YELLOW (A) 04/28/2020 0103   APPEARANCEUR HAZY (A) 04/28/2020 0103   LABSPEC 1.028 04/28/2020 0103   PHURINE 5.0 04/28/2020 0103   GLUCOSEU NEGATIVE 04/28/2020 0103   HGBUR NEGATIVE 04/28/2020 0103   BILIRUBINUR NEGATIVE 04/28/2020 0103   KETONESUR NEGATIVE 04/28/2020 0103   PROTEINUR 30 (A) 04/28/2020 0103   NITRITE NEGATIVE 04/28/2020 0103   LEUKOCYTESUR NEGATIVE 04/28/2020 0103   Sepsis Labs Invalid input(s): PROCALCITONIN,  WBC,  LACTICIDVEN Microbiology Recent Results (from the past 240 hour(s))  Culture, blood (routine x 2)     Status: Abnormal   Collection Time: 06/04/20 11:40 AM   Specimen: BLOOD RIGHT ARM  Result Value Ref Range Status   Specimen Description   Final    BLOOD RIGHT ARM Performed at [facility], [address] [place], [place] [address]    Special Requests   Final    BAA Blood Culture adequate volume Performed at [facility], [address] [place], [place] [address]    Culture  Setup Time   Final    GRAM POSITIVE COCCI ANAEROBIC BOTTLE ONLY Organism ID to follow CRITICAL RESULT CALLED TO, READ BACK BY AND VERIFIED WITH:  A.CHAPPEL,PHARMD AT [id number] ON 06/05/20 BY GM Performed at [facility], [address] [place], [place] [address]    Culture STAPHYLOCOCCUS AUREUS (A)  Final   Report Status 06/07/2020 FINAL  Final   Organism ID, Bacteria STAPHYLOCOCCUS AUREUS  Final      Susceptibility   Staphylococcus aureus - MIC*    CIPROFLOXACIN <=0.5 SENSITIVE Sensitive     ERYTHROMYCIN <=0.25 SENSITIVE Sensitive     GENTAMICIN <=0.5 SENSITIVE Sensitive     OXACILLIN 0.5 SENSITIVE Sensitive     TETRACYCLINE <=1 SENSITIVE Sensitive     VANCOMYCIN <=0.5 SENSITIVE Sensitive     TRIMETH/SULFA <=10 SENSITIVE Sensitive     CLINDAMYCIN <=0.25 SENSITIVE Sensitive     RIFAMPIN <=0.5 SENSITIVE Sensitive     Inducible Clindamycin NEGATIVE Sensitive     * STAPHYLOCOCCUS AUREUS  Blood Culture ID Panel (Reflexed)     Status: Abnormal   Collection Time: 06/04/20 11:40 AM  Result Value Ref Range Status   Enterococcus faecalis NOT DETECTED NOT DETECTED Final   Enterococcus Faecium NOT DETECTED NOT DETECTED Final   Listeria monocytogenes NOT DETECTED NOT DETECTED Final   Staphylococcus species DETECTED (A) NOT DETECTED Final    Comment: CRITICAL RESULT CALLED TO, READ BACK BY AND VERIFIED WITH: A.CHAPPEL,PHARMD AT 0917 ON 06/05/20 BY GM    Staphylococcus aureus (BCID) DETECTED (A) NOT DETECTED Final    Comment: CRITICAL RESULT CALLED TO, READ BACK BY AND VERIFIED WITH: A.CHAPPEL,PHARMD AT 0917 ON 06/05/20 BY GM    Staphylococcus epidermidis NOT DETECTED NOT DETECTED Final   Staphylococcus lugdunensis NOT DETECTED NOT DETECTED Final   Streptococcus species NOT DETECTED NOT DETECTED Final   Streptococcus agalactiae NOT DETECTED NOT DETECTED Final   Streptococcus pneumoniae NOT DETECTED NOT DETECTED Final   Streptococcus pyogenes NOT DETECTED NOT DETECTED Final   A.calcoaceticus-baumannii NOT DETECTED NOT DETECTED Final   Bacteroides fragilis NOT DETECTED NOT DETECTED Final   Enterobacterales NOT DETECTED NOT DETECTED  Final   Enterobacter  cloacae complex NOT DETECTED NOT DETECTED Final   Escherichia coli NOT DETECTED NOT DETECTED Final   Klebsiella aerogenes NOT DETECTED NOT DETECTED Final   Klebsiella oxytoca NOT DETECTED NOT DETECTED Final   Klebsiella pneumoniae NOT DETECTED NOT DETECTED Final   Proteus species NOT DETECTED NOT DETECTED Final   Salmonella species NOT DETECTED NOT DETECTED Final   Serratia marcescens NOT DETECTED NOT DETECTED Final   Haemophilus influenzae NOT DETECTED NOT DETECTED Final   Neisseria meningitidis NOT DETECTED NOT DETECTED Final   Pseudomonas aeruginosa NOT DETECTED NOT DETECTED Final   Stenotrophomonas maltophilia NOT DETECTED NOT DETECTED Final   Candida albicans NOT DETECTED NOT DETECTED Final   Candida auris NOT DETECTED NOT DETECTED Final   Candida glabrata NOT DETECTED NOT DETECTED Final   Candida krusei NOT DETECTED NOT DETECTED Final   Candida parapsilosis NOT DETECTED NOT DETECTED Final   Candida tropicalis NOT DETECTED NOT DETECTED Final   Cryptococcus neoformans/gattii NOT DETECTED NOT DETECTED Final   Meth resistant mecA/C and MREJ NOT DETECTED NOT DETECTED Final    Comment: Performed at [facility], [address] [place], [place] [address]  SARS CORONAVIRUS 2 (TAT 6-24 HRS) Nasopharyngeal Nasopharyngeal Swab     Status: Abnormal   Collection Time: 06/04/20 11:48 AM   Specimen: Nasopharyngeal Swab  Result Value Ref Range Status   SARS Coronavirus 2 POSITIVE (A) NEGATIVE Final    Comment: (NOTE) SARS-CoV-2 target nucleic acids are DETECTED.  The SARS-CoV-2 RNA is generally detectable in upper and lower respiratory specimens during the acute phase of infection. Positive results are indicative of the presence of SARS-CoV-2 RNA. Clinical correlation with patient history and other diagnostic information is  necessary to determine patient infection status. Positive results do not rule out bacterial infection or co-infection with other viruses.   The expected result is Negative.  Fact Sheet for Patients: HairSlick.no  Fact Sheet for Healthcare Providers: quierodirigir.com  This test is not yet approved or cleared by the [place] FDA and  has been authorized for detection and/or diagnosis of SARS-CoV-2 by FDA under an Emergency Use Authorization (EUA). This EUA will remain  in effect (meaning this test can be used) for the duration of the COVID-19 declaration under Section 564(b)(1) of the Act, 21 U. S.C. section 360bbb-3(b)(1), unless the authorization is terminated or revoked sooner.   Performed at [facility], [address], [place], [place] [address]   Culture, blood (Routine X 2) w Reflex to ID Panel     Status: None   Collection Time: 06/05/20 10:55 AM   Specimen: BLOOD LEFT HAND  Result Value Ref Range Status   Specimen Description BLOOD LEFT HAND  Final   Special Requests   Final    BOTTLES DRAWN AEROBIC AND ANAEROBIC Blood Culture adequate volume   Culture   Final    NO GROWTH 5 DAYS Performed at [facility], [address], [place], [place] [address]    Report Status 06/10/2020 FINAL  Final  Culture, blood (Routine X 2) w Reflex to ID Panel     Status: None   Collection Time: 06/05/20 11:51 AM   Specimen: BLOOD  Result Value Ref Range Status   Specimen Description BLOOD BLOOD LEFT HAND  Final   Special Requests   Final    BOTTLES DRAWN AEROBIC AND ANAEROBIC Blood Culture adequate volume   Culture   Final    NO GROWTH 5 DAYS Performed at [facility], [address] [place], [place] [address]    Report Status 06/10/2020 FINAL  Final  Aerobic/Anaerobic Culture w Gram Stain (surgical/deep wound)     Status: None   Collection Time: 06/06/20  5:22 PM   Specimen: Hand; Abscess  Result Value Ref Range Status   Specimen Description   Final    HAND Performed at [facility], [address], [place], [place]  [address]    Special Requests   Final    NONE Performed at [facility], [address] [place], [place] [address]    Gram Stain   Final    FEW WBC PRESENT, PREDOMINANTLY PMN NO ORGANISMS SEEN    Culture   Final    MODERATE STAPHYLOCOCCUS AUREUS NO ANAEROBES ISOLATED Performed at [facility], [address] [place], [place] [address]    Report Status 06/11/2020 FINAL  Final   Organism ID, Bacteria STAPHYLOCOCCUS AUREUS  Final      Susceptibility   Staphylococcus aureus - MIC*    CIPROFLOXACIN <=0.5 SENSITIVE Sensitive     ERYTHROMYCIN <=0.25 SENSITIVE Sensitive     GENTAMICIN <=0.5 SENSITIVE Sensitive     OXACILLIN 0.5 SENSITIVE Sensitive     TETRACYCLINE <=1 SENSITIVE Sensitive     VANCOMYCIN 1 SENSITIVE Sensitive     TRIMETH/SULFA <=10 SENSITIVE Sensitive     CLINDAMYCIN <=0.25 SENSITIVE Sensitive     RIFAMPIN <=0.5 SENSITIVE Sensitive     Inducible Clindamycin NEGATIVE Sensitive     * MODERATE STAPHYLOCOCCUS AUREUS  Aerobic/Anaerobic Culture w Gram Stain (surgical/deep wound)     Status: None   Collection Time: 06/07/20  5:04 PM   Specimen: PATH Other; Wound  Result Value Ref Range Status   Specimen Description   Final    FLUID RIGHT WRIST Performed at [facility], [address] [place], [place] [address]    Special Requests   Final    NONE Performed at [facility], [address] [place], [place] [address]    Gram Stain NO WBC SEEN RARE GRAM POSITIVE COCCI   Final   Culture   Final    FEW STAPHYLOCOCCUS AUREUS NO ANAEROBES ISOLATED Performed at [facility], [address], [place], [place] [address]    Report Status 06/13/2020 FINAL  Final   Organism ID, Bacteria STAPHYLOCOCCUS AUREUS  Final      Susceptibility   Staphylococcus aureus - MIC*    CIPROFLOXACIN <=0.5 SENSITIVE Sensitive     ERYTHROMYCIN <=0.25 SENSITIVE Sensitive     GENTAMICIN <=0.5 SENSITIVE Sensitive     OXACILLIN 0.5 SENSITIVE Sensitive      TETRACYCLINE <=1 SENSITIVE Sensitive     VANCOMYCIN <=0.5 SENSITIVE Sensitive     TRIMETH/SULFA <=10 SENSITIVE Sensitive     CLINDAMYCIN <=0.25 SENSITIVE Sensitive     RIFAMPIN <=0.5 SENSITIVE Sensitive     Inducible Clindamycin NEGATIVE Sensitive     * FEW STAPHYLOCOCCUS AUREUS  Aerobic/Anaerobic Culture w Gram Stain (surgical/deep wound)     Status: None   Collection Time: 06/07/20  5:04 PM   Specimen: PATH Other; Wound  Result Value Ref Range Status   Specimen Description WOUND RIGHT HAND  Final   Special Requests NONE  Final   Gram Stain   Final    RARE WBC PRESENT,BOTH PMN AND MONONUCLEAR NO ORGANISMS SEEN    Culture   Final    FEW STAPHYLOCOCCUS AUREUS NO ANAEROBES ISOLATED Performed at [facility], [address] [place],  [place] [address]    Report Status 06/13/2020 FINAL  Final   Organism ID, Bacteria STAPHYLOCOCCUS AUREUS  Final      Susceptibility   Staphylococcus aureus - MIC*    CIPROFLOXACIN <=0.5 SENSITIVE Sensitive     ERYTHROMYCIN <=0.25 SENSITIVE Sensitive     GENTAMICIN <=0.5 SENSITIVE Sensitive     OXACILLIN 0.5 SENSITIVE Sensitive     TETRACYCLINE <=1 SENSITIVE Sensitive     VANCOMYCIN 1 SENSITIVE Sensitive     TRIMETH/SULFA <=10 SENSITIVE Sensitive     CLINDAMYCIN <=0.25 SENSITIVE Sensitive     RIFAMPIN <=0.5 SENSITIVE Sensitive     Inducible Clindamycin NEGATIVE Sensitive     * FEW STAPHYLOCOCCUS AUREUS     Total time spend on discharging this patient, including the last patient exam, discussing the hospital stay, instructions for ongoing care as it relates to all pertinent caregivers, as well as preparing the medical discharge records, prescriptions, and/or referrals as applicable, is 30 minutes.    Darlin Priestly, MD  [facility] 06/14/2020, 9:23 AM

## 2020-06-14 NOTE — Care Management Important Message (Signed)
Important Message  Patient Details  Name: Gregory Small MRN: [id number] Date of Birth: 04-Jun-1919   Medicare Important Message Given:  Yes  Talked with HCPOA, Hyacinth Meeker by phone [phone]) and reviewed the Important Message from Medicare.  She is agreement with the discharge plan and I asked if she would like a copy of the form and she declined, I thanked her for her time.   Olegario Messier A Nijae Doyel 06/14/2020, 12:04 PM

## 2020-06-14 NOTE — TOC Progression Note (Addendum)
Transition of Care [facility]) - Progression Note    Patient Details  Name: LARENZ FRASIER MRN: [id number] Date of Birth: December 31, 1919  Transition of Care [facility]) CM/SW Contact  Margarito Liner, LCSW Phone Number: 06/14/2020, 9:33 AM  Clinical Narrative: Left voicemail for Melissa at [facility]    10:17 am: Received call back from [place]. Confirmed plans for discharge today. MD will have discharge summary done within an hour. [facility] set up for 1:30.  11:46 am: Discharge summary faxed to SNF. [facility] can transport as long as he's no longer on COVID precautions. Sent message to RN, MD, Lenon Curt, and TOC supervisor to see if precautions could be discontinued.  Expected Discharge Plan: Skilled Nursing Facility Barriers to Discharge: Continued Medical Work up  Expected Discharge Plan and Services Expected Discharge Plan: Skilled Nursing Facility       Living arrangements for the past 2 months: Assisted Living Facility Expected Discharge Date: 06/14/20                                     Social Determinants of Health (SDOH) Interventions    Readmission Risk Interventions Readmission Risk Prevention Plan 06/06/2020  Transportation Screening Complete  PCP or Specialist Appt within 3-5 Days Complete  HRI or Home Care Consult Complete  Social Work Consult for Recovery Care Planning/Counseling Complete  Palliative Care Screening Not Applicable  Medication Review Oceanographer) Complete  Some recent data might be hidden

## 2020-06-14 NOTE — TOC Transition Note (Signed)
Transition of Care [facility]) - CM/SW Discharge Note   Patient Details  Name: Gregory Small MRN: [id number] Date of Birth: Sep 08, 1919  Transition [facility] Care [facility]) CM/SW Contact:  Margarito Liner, LCSW Phone Number: 06/14/2020, 12:30 PM   Clinical Narrative:  Isolation precautions have been discontinued. Patient has orders to discharge to [facility] ([address] [place], [place] [address]) today. [facility] is set up for 1:30. RN will call report to [phone]. No further concerns. CSW signing off.   Final next level of care: Skilled Nursing Facility Barriers to Discharge: Barriers Resolved   Patient Goals and CMS Choice Patient states their goals for this hospitalization and ongoing recovery are:: prefers back to ALF, understands if SNF is needed CMS Medicare.gov Compare Post Acute Care list provided to:: Patient Choice offered to / list presented to : Patient,Adult Children  Discharge Placement PASRR number recieved: 06/06/20            Patient chooses bed at: Other - please specify in the comment section below: [facility]) Patient to be transferred to facility by: [facility] Name of family member notified: Earlie Counts Patient and family notified of of transfer: 06/14/20  Discharge Plan and Services                                     Social Determinants of Health (SDOH) Interventions     Readmission Risk Interventions Readmission Risk Prevention Plan 06/06/2020  Transportation Screening Complete  PCP or Specialist Appt within 3-5 Days Complete  HRI or Home Care Consult Complete  Social Work Consult for Recovery Care Planning/Counseling Complete  Palliative Care Screening Not Applicable  Medication Review Oceanographer) Complete  Some recent data might be hidden

## 2020-06-14 NOTE — Progress Notes (Signed)
Patient transported by EMS to facility.  Report called to nurse at [facility]) who verbalized understanding and no issues/questions/or concerns. Discharge packet with IV infusion prescription sent with EMS. RUA PICC clamped, without issue.

## 2020-06-14 NOTE — Progress Notes (Addendum)
Pt. Lungs sound, bilat expiratory wheeze. Pt. refuse albuterol inhaler.  Pt. Alert and appears restless. Pt. Get out of bed upon impulse without calling for assistance. Redirection and safety education provided. Infection prevention precaution maintained. Pt, denies difficulty breathing. Oxygen saturation 99% on room air. Will continue to monitor redirect as needed. Safety protocol maintained.

## 2020-07-22 ENCOUNTER — Other Ambulatory Visit: Payer: Self-pay | Admitting: Student

## 2020-07-22 DIAGNOSIS — M810 Age-related osteoporosis without current pathological fracture: Secondary | ICD-10-CM

## 2020-07-27 ENCOUNTER — Ambulatory Visit
Admission: RE | Admit: 2020-07-27 | Discharge: 2020-07-27 | Disposition: A | Discharge status: Home / Self Care | Payer: Medicare Other | Source: Ambulatory Visit | Attending: Pain Medicine | Admitting: Pain Medicine

## 2020-07-27 ENCOUNTER — Other Ambulatory Visit: Payer: Self-pay

## 2020-07-27 DIAGNOSIS — M810 Age-related osteoporosis without current pathological fracture: Secondary | ICD-10-CM | POA: Insufficient documentation

## 2020-09-03 ENCOUNTER — Ambulatory Visit
Admission: RE | Admit: 2020-09-03 | Discharge: 2020-09-03 | Disposition: A | Discharge status: Home / Self Care | Payer: Medicare Other | Source: Ambulatory Visit | Attending: *Deleted | Admitting: *Deleted

## 2020-09-03 ENCOUNTER — Ambulatory Visit
Admission: RE | Admit: 2020-09-03 | Discharge: 2020-09-03 | Disposition: A | Discharge status: Home / Self Care | Payer: Medicare Other | Attending: Physician Assistant | Admitting: Physician Assistant

## 2020-09-03 ENCOUNTER — Other Ambulatory Visit: Payer: Self-pay | Admitting: *Deleted

## 2020-09-03 ENCOUNTER — Other Ambulatory Visit: Payer: Self-pay | Admitting: Surgical

## 2020-09-03 ENCOUNTER — Other Ambulatory Visit: Payer: Self-pay

## 2020-09-03 DIAGNOSIS — I700 Atherosclerosis of aorta: Secondary | ICD-10-CM | POA: Diagnosis not present

## 2020-09-03 DIAGNOSIS — M545 Low back pain, unspecified: Secondary | ICD-10-CM | POA: Insufficient documentation

## 2020-09-03 DIAGNOSIS — X58XXXA Exposure to other specified factors, initial encounter: Secondary | ICD-10-CM | POA: Insufficient documentation

## 2020-09-03 DIAGNOSIS — M4856XA Collapsed vertebra, not elsewhere classified, lumbar region, initial encounter for fracture: Secondary | ICD-10-CM | POA: Diagnosis not present

## 2020-11-25 ENCOUNTER — Other Ambulatory Visit: Payer: Self-pay

## 2020-11-25 ENCOUNTER — Emergency Department
Admission: EM | Admit: 2020-11-25 | Discharge: 2020-11-25 | Disposition: A | Discharge status: Skilled Nursing Facility | Payer: Medicare Other | Attending: Emergency Medicine | Admitting: Emergency Medicine

## 2020-11-25 ENCOUNTER — Emergency Department: Payer: Medicare Other

## 2020-11-25 DIAGNOSIS — Z20822 Contact with and (suspected) exposure to covid-19: Secondary | ICD-10-CM | POA: Insufficient documentation

## 2020-11-25 DIAGNOSIS — I129 Hypertensive chronic kidney disease with stage 1 through stage 4 chronic kidney disease, or unspecified chronic kidney disease: Secondary | ICD-10-CM | POA: Insufficient documentation

## 2020-11-25 DIAGNOSIS — Z8616 Personal history of COVID-19: Secondary | ICD-10-CM | POA: Insufficient documentation

## 2020-11-25 DIAGNOSIS — Z515 Encounter for palliative care: Secondary | ICD-10-CM | POA: Diagnosis not present

## 2020-11-25 DIAGNOSIS — N1831 Chronic kidney disease, stage 3a: Secondary | ICD-10-CM | POA: Diagnosis not present

## 2020-11-25 DIAGNOSIS — R2243 Localized swelling, mass and lump, lower limb, bilateral: Secondary | ICD-10-CM | POA: Insufficient documentation

## 2020-11-25 DIAGNOSIS — Z7951 Long term (current) use of inhaled steroids: Secondary | ICD-10-CM | POA: Insufficient documentation

## 2020-11-25 DIAGNOSIS — E039 Hypothyroidism, unspecified: Secondary | ICD-10-CM | POA: Diagnosis not present

## 2020-11-25 DIAGNOSIS — R531 Weakness: Secondary | ICD-10-CM | POA: Insufficient documentation

## 2020-11-25 DIAGNOSIS — R4182 Altered mental status, unspecified: Secondary | ICD-10-CM | POA: Insufficient documentation

## 2020-11-25 LAB — RESP PANEL BY RT-PCR (FLU A&B, COVID) ARPGX2
Influenza A by PCR: NEGATIVE
Influenza B by PCR: NEGATIVE
SARS Coronavirus 2 by RT PCR: NEGATIVE

## 2020-11-25 MED ORDER — OXYCODONE HCL 5 MG/5ML PO SOLN
5.0000 mg | ORAL | Status: AC
Start: 2020-11-25 — End: 2020-11-25
  Administered 2020-11-25: 5 mg via ORAL
  Filled 2020-11-25 (×2): qty 5

## 2020-11-25 NOTE — ED Provider Notes (Addendum)
[facility] Emergency Department Provider Note  ____________________________________________  Time seen: Approximately 12:24 PM  I have reviewed the triage vital signs and the nursing notes.   HISTORY  Chief Complaint Edema  Level 5 Caveat: Portions of the History and Physical including HPI and review of systems are unable to be completely obtained due to patient altered mental status  HPI Gregory Small is a 87 y.o. male with a history of hypertension hypothyroidism myasthenia gravis CKD who was brought to the ED due to generalized pain, worse in bilateral knees and lower legs.  He has had chronic lower extremity edema, worsening.  He is wheelchair-bound.  He has had a precipitous decline in his functional status since a recent hospitalization.  This week he and his daughter decided that he would be DNR and comfort care only.  Over the last few days he has stopped eating, is very weak, and in severe pain.  Daughter at bedside states that he is ready to transition to hospice care.  He has come to the ED for pain control today.  She has already spoken with hospice will come evaluate him at [facility] assisted living.  Plan is to avoid invasive procedures including lab draws.  Daughter declines IV fluids antibiotics or other supportive measures on patient's behalf.    Past Medical History:  Diagnosis Date   Hypertension    Hypothyroidism    Myasthenia gravis [facility])      Patient Active Problem List   Diagnosis Date Noted   MSSA (methicillin susceptible Staphylococcus aureus) septicemia (HCC) 06/05/2020   Fall 06/04/2020   Cellulitis of right upper arm 06/04/2020   CKD (chronic kidney disease), stage IIIa 06/04/2020   Severe sepsis (HCC) 06/04/2020   HTN (hypertension) 06/04/2020   GERD (gastroesophageal reflux disease) 06/04/2020   COVID-19 virus infection 06/04/2020   Vertigo 04/28/2020   Acute kidney injury (HCC) 04/21/2020   Left knee pain  04/20/2020   Chronic pain syndrome 04/20/2020   Left hip pain 04/19/2020   Myasthenia gravis (HCC) 04/19/2020   Atrial fibrillation, chronic (HCC) 04/19/2020   Hypothyroidism 04/19/2020     Past Surgical History:  Procedure Laterality Date   BACK SURGERY     INCISION AND DRAINAGE OF WOUND Right 06/07/2020   Procedure: IRRIGATION AND DEBRIDEMENT WOUND-Right Hand and Wrist;  Surgeon: Lyndle Herrlich, MD;  Location: [facility] ORS;  Service: Orthopedics;  Laterality: Right;     Prior to Admission medications   Medication Sig Start Date End Date Taking? Authorizing Provider  acetaminophen (TYLENOL) 500 MG tablet Take 500-1,000 mg by mouth every 8 (eight) hours as needed for pain. 12/24/19   [provider]  albuterol (VENTOLIN HFA) 108 (90 Base) MCG/ACT inhaler Inhale 1-2 puffs into the lungs every 6 (six) hours as needed for wheezing or shortness of breath. 04/15/20   [provider]  brimonidine (ALPHAGAN) 0.2 % ophthalmic solution Place 1 drop into both eyes 2 (two) times daily. 04/12/20   [provider]  calcium carbonate (OSCAL) 1500 (600 Ca) MG TABS tablet Take 600 mg of elemental calcium by mouth daily with breakfast.    [provider]  Calcium Carbonate-Vit D-Min (CALTRATE 600+D PLUS MINERALS) 600-800 MG-UNIT TABS Take 1 tablet by mouth daily.    [provider]  docusate sodium (COLACE) 100 MG capsule Take 1 capsule (100 mg total) by mouth 2 (two) times daily as needed for mild constipation. 06/14/20   Darlin Priestly, MD  feeding supplement (ENSURE ENLIVE /  ENSURE PLUS) LIQD Take 237 mLs by mouth at bedtime. 06/14/20   Darlin Priestly, MD  furosemide (LASIX) 20 MG tablet Take 20 mg by mouth daily. 05/16/20   [provider]  levothyroxine (SYNTHROID) 88 MCG tablet Take 88 mcg by mouth daily before breakfast. 11/12/19   [provider]  loperamide (IMODIUM A-D) 2 MG tablet Take 2-4 mg by mouth 4 (four) times daily as needed for diarrhea or  loose stools. TAKE TWO TABLETS BY MOUTH AFTER 1ST LOOSE STOOL THEN 1 TABLET BY MOUTH AFTER EACH SUBSEQUENT LOOSE STOOL. ( DO NOT EXCEED 4 TABLETS IN 24 HOURS) **IF DIARRHEA PERSISTS LONGER THAN 24 HOURS CALL M.D.    [provider]  meclizine (ANTIVERT) 25 MG tablet Take 1 tablet (25 mg total) by mouth 3 (three) times daily. 04/29/20   Lynn Ito, MD  Multiple Vitamin (MULTIVITAMIN WITH MINERALS) TABS tablet Take 1 tablet by mouth daily. 06/14/20   Darlin Priestly, MD  nutrition supplement, JUVEN, (JUVEN) PACK Take 1 packet by mouth 2 (two) times daily between meals. 06/14/20   Darlin Priestly, MD  pantoprazole (PROTONIX) 40 MG tablet Take 40 mg by mouth daily. 04/14/20   [provider]  predniSONE (DELTASONE) 20 MG tablet Take 20 mg by mouth daily. 04/14/20   [provider]  pyridostigmine (MESTINON) 60 MG tablet Take 60 mg by mouth 3 (three) times daily. 04/14/20   [provider]  timolol (TIMOPTIC) 0.25 % ophthalmic solution Apply 1 drop to eye 2 (two) times daily. 10/22/19   [provider]     Allergies Simvastatin and Sulfa antibiotics   Family History  Problem Relation Age of Onset   Cataracts Daughter     Social History Social History   Tobacco Use   Smoking status: Never   Smokeless tobacco: Never  Vaping Use   Vaping Use: Never used  Substance Use Topics   Alcohol use: Not Currently   Drug use: Never    Review of Systems  Constitutional:   No fever or chills.  ENT:   No sore throat. No rhinorrhea. Cardiovascular:   No chest pain or syncope. Respiratory:   No dyspnea or cough. Gastrointestinal:   Negative for abdominal pain, vomiting and diarrhea.  Musculoskeletal:   Chronic bilateral lower extremity pain and swelling All other systems reviewed and are negative except as documented above in ROS and HPI.  ____________________________________________   PHYSICAL EXAM:  VITAL SIGNS: ED Triage Vitals  Enc Vitals Group     BP  11/25/20 1046 (!) 120/31     Pulse Rate 11/25/20 1046 100     Resp 11/25/20 1046 17     Temp 11/25/20 1046 98.6 F (37 C)     Temp Source 11/25/20 1046 Oral     SpO2 11/25/20 1046 93 %     Weight 11/25/20 1047 200 lb (90.7 kg)     Height 11/25/20 1047 6' (1.829 m)     Head Circumference --      Peak Flow --      Pain Score --      Pain Loc --      Pain Edu? --      Excl. in GC? --     Vital signs reviewed, nursing assessments reviewed.   Constitutional:   Awake, not alert, not oriented Eyes:   Conjunctivae are normal. EOMI. ENT      Head:   Normocephalic and atraumatic.      Mouth/Throat:   Dry mucous  membranes      Neck:   No meningismus. Full ROM. Cardiovascular:   RRR.  Respiratory:   Normal respiratory effort without tachypnea/retractions.   Musculoskeletal:   2+ pitting edema bilateral lower extremities. Neurologic:   Groaning, no intelligible speech.  Motor grossly intact. .  Skin:    Skin is warm, dry and intact. No rash noted.  No wounds.  ____________________________________________    LABS (pertinent positives/negatives) (all labs ordered are listed, but only abnormal results are displayed) Labs Reviewed  RESP PANEL BY RT-PCR (FLU A&B, COVID) [id number]   ____________________________________________   EKG Interpreted by me Atrial fibrillation rate of 100.  Normal axis and intervals, no acute ischemic changes ____________________________________________    RADIOLOGY  DG Chest 1 View  Result Date: 11/25/2020 CLINICAL DATA:  Edema EXAM: CHEST  1 VIEW COMPARISON:  06/08/2020 FINDINGS: Heart and mediastinal contours are within normal limits. No focal opacities or effusions. No acute bony abnormality. IMPRESSION: No active disease. Electronically Signed   By: Charlett Nose M.D.   On: 11/25/2020 11:37    ____________________________________________   PROCEDURES Procedures  ____________________________________________  CLINICAL IMPRESSION / ASSESSMENT  AND PLAN / ED COURSE  Pertinent labs & imaging results that were available during my care of the patient were reviewed by me and considered in my medical decision making (see chart for details).  Gregory Small was evaluated in Emergency Department on 11/25/2020 for the symptoms described in the history of present illness. He was evaluated in the context of the global COVID-19 pandemic, which necessitated consideration that the patient might be at risk for infection with the SARS-CoV-2 virus that causes COVID-19. Institutional protocols and algorithms that pertain to the evaluation of patients at risk for COVID-19 are in a state of rapid change based on information released by regulatory bodies including the CDC and federal and state organizations. These policies and algorithms were followed during the patient's care in the ED.     Clinical Course as of 11/25/20 1241  Thu Nov 25, 2020  1214 Patient brought to the ED due to worsening leg swelling, generalized pain from multiple musculoskeletal sources, generalized weakness and poor oral intake that is worsened over the last few days.  Daughter at bedside notes that patient's recent advance directive is to be DNR and comfort care only, and she is currently in the process of transitioning to hospice care.  She requests only pain medication, declines lab IV fluids or antibiotics. [PS]    Clinical Course User Index [PS] Sharman Cheek, MD    ----------------------------------------- 12:28 PM on 11/25/2020 ----------------------------------------- Discussed with hospitalist representative Melissa who can have patient evaluated today at [facility].  Plan for hospice to evaluate and admit to hospice.   ____________________________________________   FINAL CLINICAL IMPRESSION(S) / ED DIAGNOSES    Final diagnoses:  End of life care     ED Discharge Orders          Ordered    Care order/instruction       Comments: Evaluate and  admit to [facility]   11/25/20 1224            Portions of this note were generated with dragon dictation software. Dictation errors may occur despite best attempts at proofreading.   Sharman Cheek, MD 11/25/20 1230    Sharman Cheek, MD 11/25/20 1242

## 2020-11-25 NOTE — ED Triage Notes (Signed)
First Nurse Note:  Arrives from [facility] via [place].  EMS called for lower leg edema and weeping .  Unknown amount of time.  Hx Afib, DNR. CBG:  94.  973  22g right hand.  112/80.

## 2020-11-25 NOTE — ED Notes (Signed)
Will obtain VS when EMS arrives for transport to [facility].

## 2020-11-25 NOTE — ED Notes (Signed)
Spoke with daughter and Dr Scotty Court, daughter is requesting the minimal test, states she is wanting some assistance setting up hospice care for the pt and hoping to have that initiated at [facility]. States she spoke with the PA there about starting the palliative care

## 2020-11-25 NOTE — ED Notes (Signed)
See triage and first nurse notes.   Pt is here mostly for pain management and coordination of hospice and palliative care,   Daughter at bedside. Oxycodone not available yet in pyxis. Pt resting in bed with eyes closed, covered in blankets.

## 2020-11-25 NOTE — ED Provider Notes (Signed)
HPI: Pt is a 87 y.o. male who presents with complaints of end of life car  The patient p/w  end of life  Family wants hospice care. No labs or imaging   ROS: Denies fever, chest pain, vomiting  Past Medical History:  Diagnosis Date   Hypertension    Hypothyroidism    Myasthenia gravis (HCC)    Vitals:   11/25/20 1046  BP: (!) 120/31  Pulse: 100  Resp: 17  Temp: 98.6 F (37 C)  SpO2: 93%    Focused Physical Exam: Gen: resting in recliner chare  CV: Reg rate  Lung: No increased WOB, no stridor GI: ND, no obvious masses Neuro: resting   Medical Decision Making and Plan: Given the patient's initial medical screening exam, the following diagnostic evaluation has been ordered. The patient will be placed in the appropriate treatment space, once one is available, to complete the evaluation and treatment. I have discussed the plan of care with the patient and I have advised the patient that an ED physician or mid-level practitioner will reevaluate their condition after the test results have been received, as the results may give them additional insight into the type of treatment they may need.   Diagnostics:none   Treatments: none immediately   Concha Se, MD 11/25/20 1105

## 2020-11-25 NOTE — ED Triage Notes (Signed)
Pt has severe swelling to BL LE with redness and pain with lite touch to the left LE. Pt is alert on arrival, in NAD. 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

## 2022-07-06 IMAGING — DX DG CHEST 1V PORT
1 series · 1 of 1 positions shown · non-contrast
Comparison: Prior chest x-ray 04/27/2020

CLINICAL DATA: Unwitnessed fall

EXAM:
PORTABLE CHEST 1 VIEW

[chest ap]
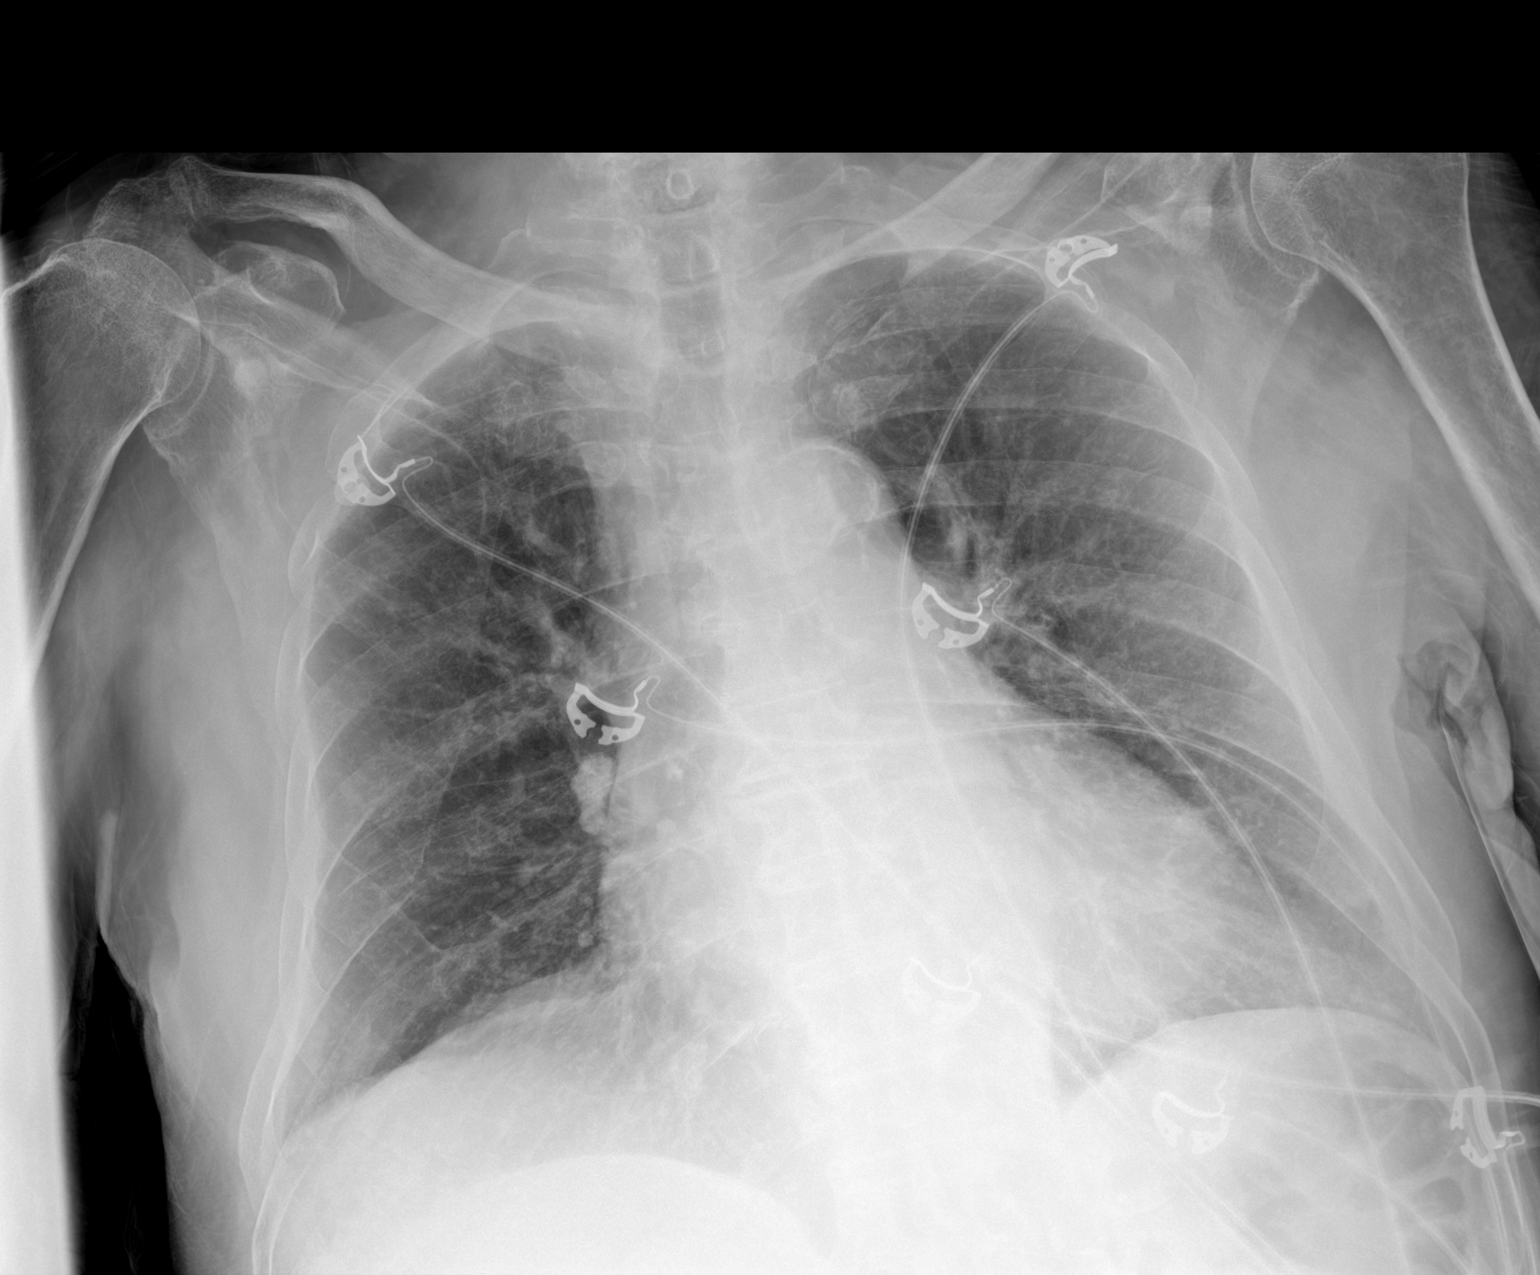

[1 of 1 positions shown; findings below may reference images not displayed]

FINDINGS: Stable cardiac and mediastinal contours. Atherosclerotic
calcification present in the transverse aorta. Stable appearance of
the lungs with chronic bronchitic changes and mild interstitial
prominence. No focal consolidation, pleural effusion or
pneumothorax. No acute fracture identified.
IMPRESSION: No active disease.

## 2022-07-07 IMAGING — MR MR [PERSON_NAME]*[PERSON_NAME]* W/O CM
5 series · 37 of 40 positions shown · non-contrast
Comparison: Right hand x-rays dated June 05, 2020.

CLINICAL DATA: Right hand pain and swelling.  Bacteremia.

EXAM:
MRI OF THE RIGHT HAND WITHOUT CONTRAST
TECHNIQUE: Multiplanar, multisequence MR imaging of the right hand was
performed. No intravenous contrast was administered.

[Series 22: T1 · axial · right · 4.0mm · 0.31mm/px · z∈[-54,+130]mm · 11 of 42 slices shown (1 of 2)]
[im 1/42]
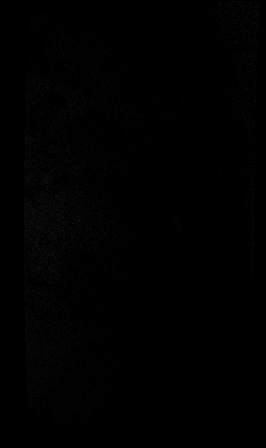
[im 5/42]
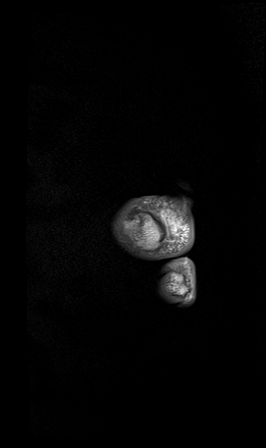
[im 9/42]
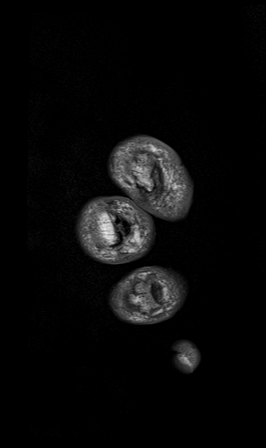
[im 13/42]
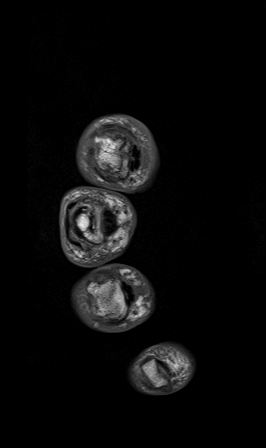
[im 17/42]
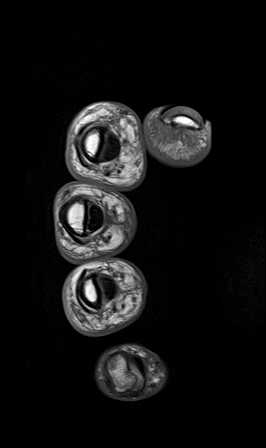
[im 21/42]
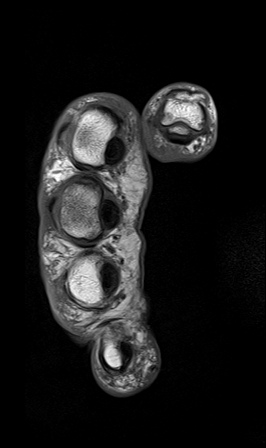
[im 25/42]
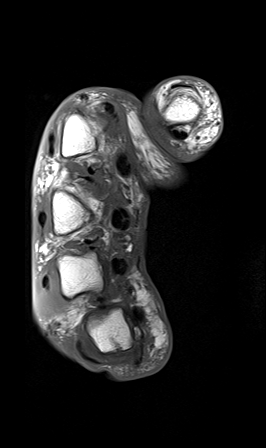
[im 29/42]
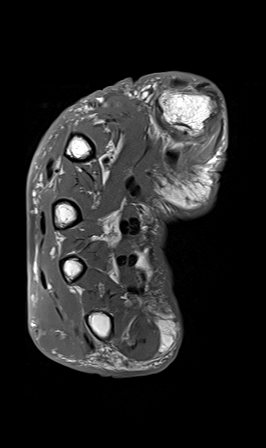
[im 33/42]
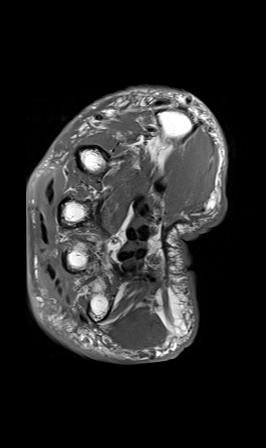
[im 37/42]
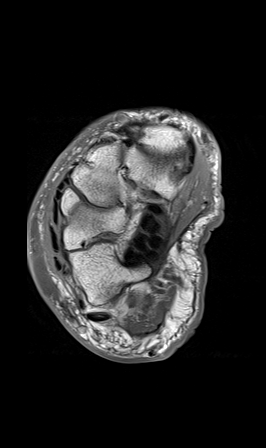
[im 42/42]
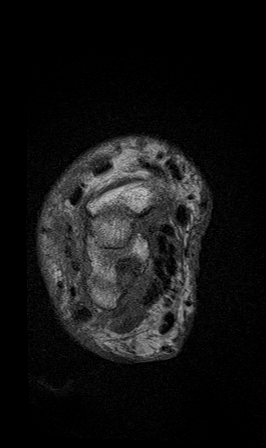

[Series 24: T1 · sagittal · right · 3.0mm · 0.47mm/px · 5 of 23 slices shown (2 of 2)]
[im 1/23]
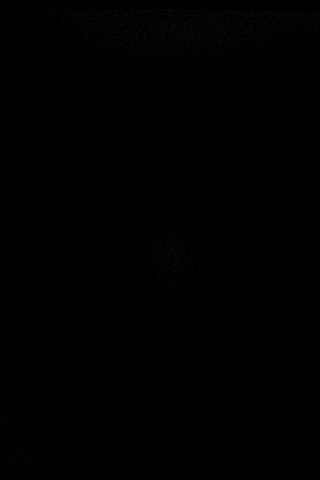
[im 6/23]
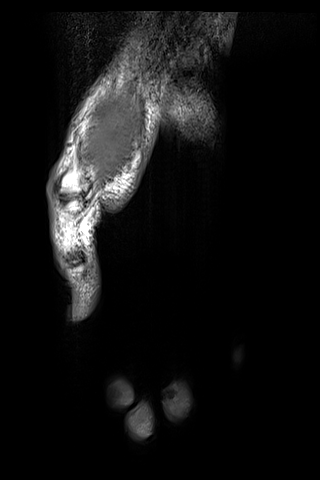
[im 12/23]
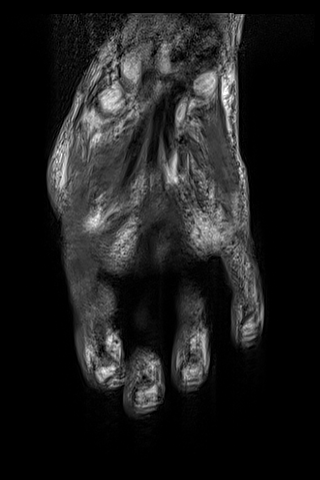
[im 17/23]
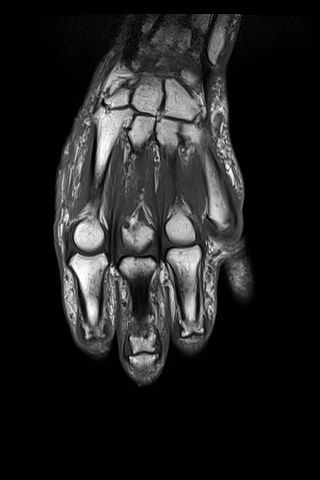
[im 23/23]
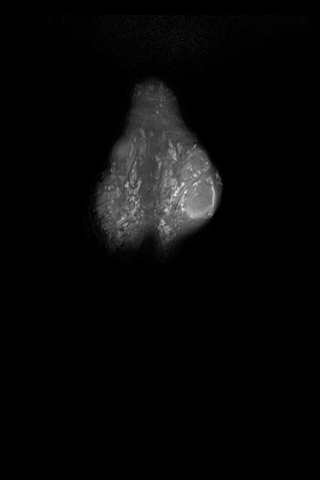

[Series 25: T2 · axial · right · 4.0mm · 0.31mm/px · z∈[-54,+130]mm · 8 of 42 slices shown]
[im 1/42]
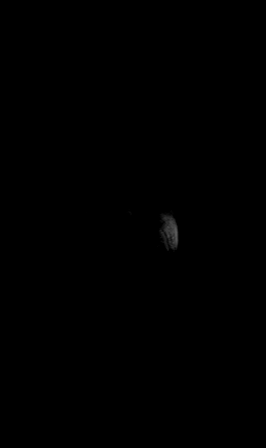
[im 5/42]
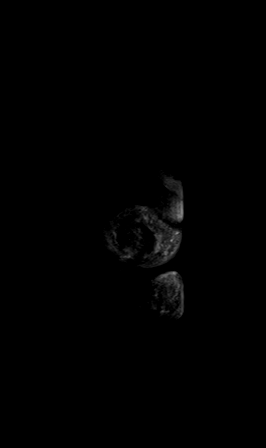
[im 14/42]
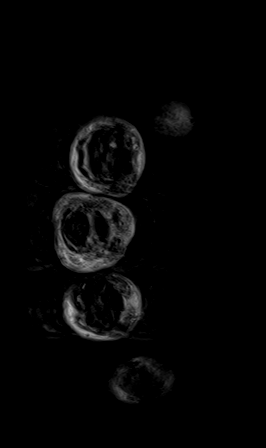
[im 19/42]
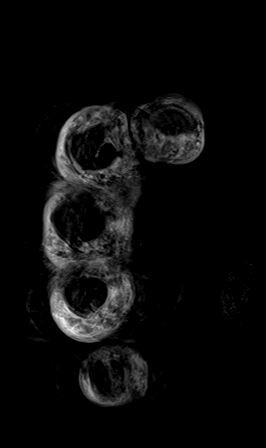
[im 23/42]
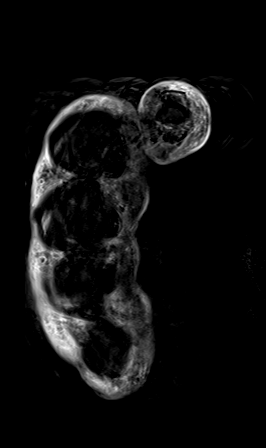
[im 28/42]
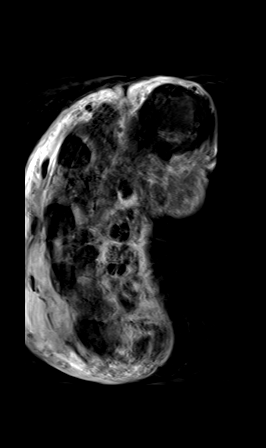
[im 37/42]
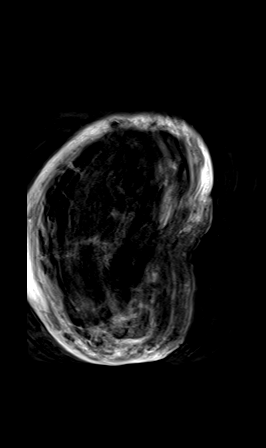
[im 42/42]
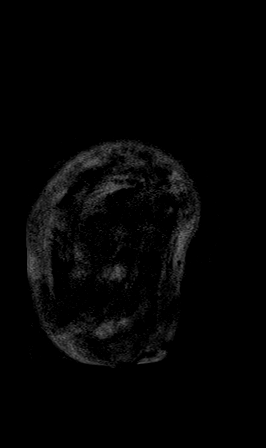

[Series 27: STIR · sagittal · right · 3.0mm · 0.47mm/px · 4 of 23 slices shown]
[im 1/23]
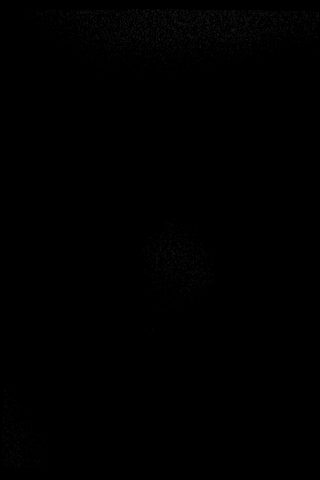
[im 6/23]
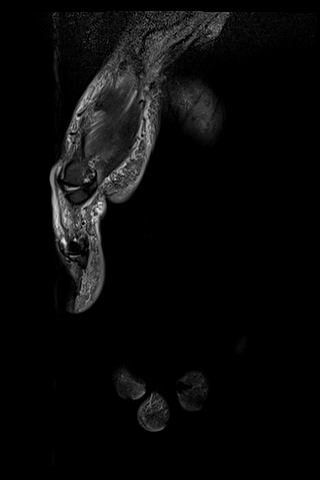
[im 12/23]
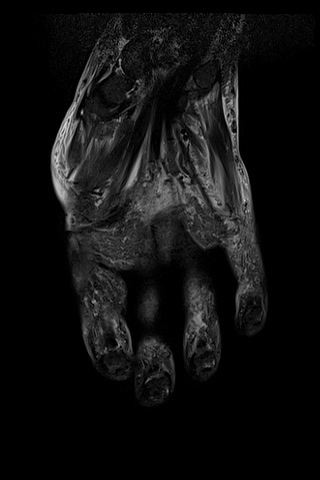
[im 17/23]
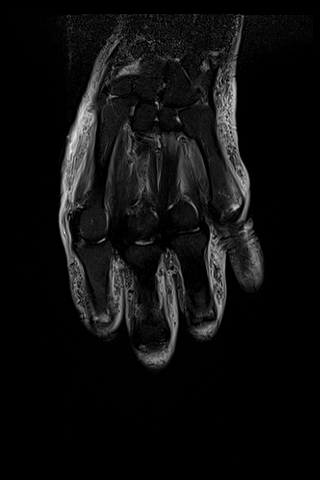

[Series 28: PD fat-sat · coronal · right · 3.0mm · 0.49mm/px · 9 of 38 slices shown]
[im 1/38]
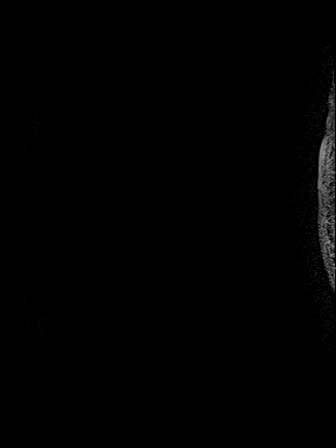
[im 5/38]
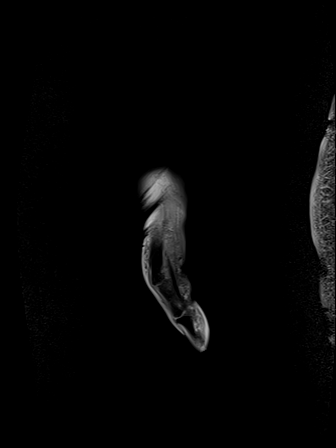
[im 10/38]
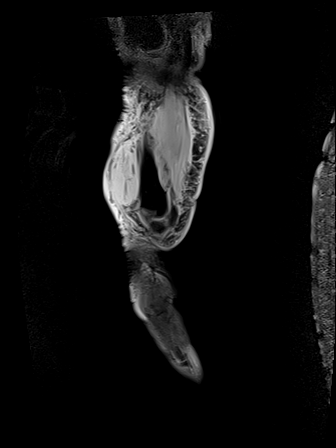
[im 14/38]
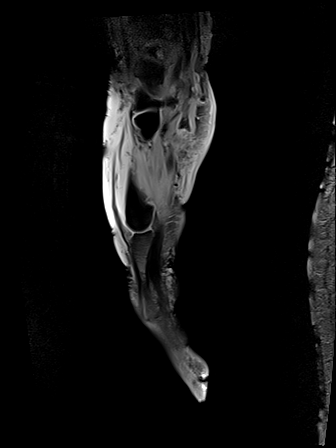
[im 19/38]
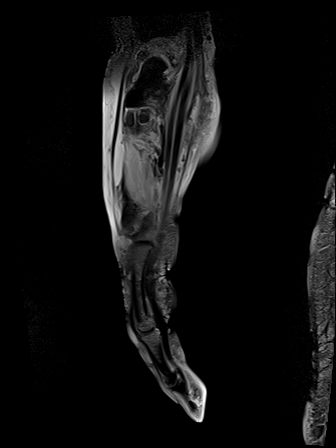
[im 24/38]
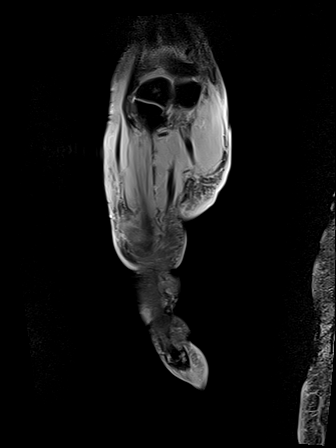
[im 28/38]
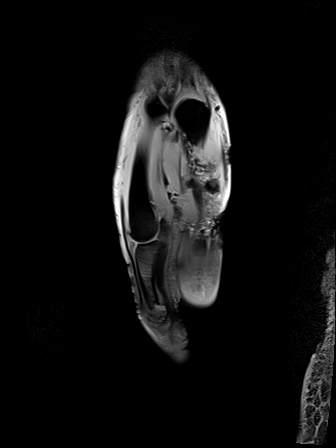
[im 33/38]
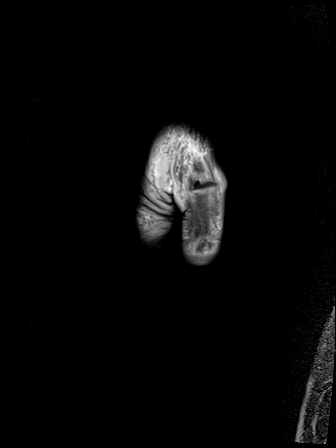
[im 38/38]
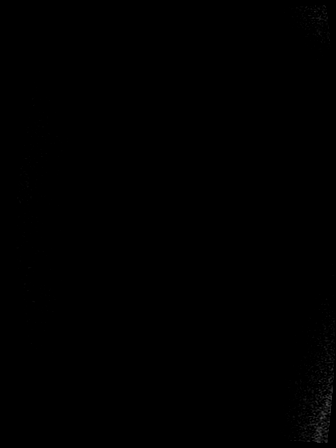

[37 of 40 positions shown; findings below may reference images not displayed]

FINDINGS: Despite efforts by the technologist and patient, motion artifact is
present on today's exam and could not be eliminated. This reduces
exam sensitivity and specificity.

Bones/Joint/Cartilage

No suspicious marrow signal abnormality. No fracture or dislocation.
Mild osteoarthritis of the wrist, MCP joints, and IP joints. Large
cyst in the lunate. Small midcarpal joint effusion.

Ligaments

Collateral ligaments are intact.

Muscles and Tendons
Flexor and extensor tendons are intact. There is prominent soft
tissue edema around the extensor tendons but not within the tendon
sheaths themselves.

Soft tissue
Diffuse soft tissue swelling, most severe over the dorsal hand. No
fluid collection or hematoma. No soft tissue mass.
IMPRESSION: 1. Diffuse soft tissue swelling, most severe over the dorsal hand,
consistent with reported history of cellulitis. No abscess.
2. No evidence of osteomyelitis.
3. No tenosynovitis. Prominent soft tissue edema around the extensor
tendons but not within the tendon sheaths themselves.
